# Patient Record
Sex: Male | Born: 1940 | Race: White | Hispanic: No | Marital: Married | Smoking: Former smoker
Health system: Southern US, Community
[De-identification: ages and names within clinical notes are randomized; demographics above are authoritative.]

## PROBLEM LIST (undated history)

## (undated) DIAGNOSIS — Z87442 Personal history of urinary calculi: Secondary | ICD-10-CM

## (undated) DIAGNOSIS — C61 Malignant neoplasm of prostate: Secondary | ICD-10-CM

## (undated) DIAGNOSIS — Z8489 Family history of other specified conditions: Secondary | ICD-10-CM

## (undated) DIAGNOSIS — I499 Cardiac arrhythmia, unspecified: Secondary | ICD-10-CM

## (undated) DIAGNOSIS — I10 Essential (primary) hypertension: Secondary | ICD-10-CM

## (undated) DIAGNOSIS — R011 Cardiac murmur, unspecified: Secondary | ICD-10-CM

## (undated) DIAGNOSIS — M1990 Unspecified osteoarthritis, unspecified site: Secondary | ICD-10-CM

## (undated) DIAGNOSIS — K429 Umbilical hernia without obstruction or gangrene: Secondary | ICD-10-CM

## (undated) DIAGNOSIS — G4730 Sleep apnea, unspecified: Secondary | ICD-10-CM

## (undated) DIAGNOSIS — I480 Paroxysmal atrial fibrillation: Secondary | ICD-10-CM

## (undated) DIAGNOSIS — K529 Noninfective gastroenteritis and colitis, unspecified: Secondary | ICD-10-CM

## (undated) HISTORY — DX: Malignant neoplasm of prostate: C61

## (undated) HISTORY — DX: Cardiac murmur, unspecified: R01.1

## (undated) HISTORY — PX: NASAL SINUS SURGERY: SHX719

## (undated) HISTORY — DX: Unspecified osteoarthritis, unspecified site: M19.90

## (undated) HISTORY — DX: Noninfective gastroenteritis and colitis, unspecified: K52.9

## (undated) HISTORY — DX: Cardiac arrhythmia, unspecified: I49.9

## (undated) HISTORY — PX: PROSTATECTOMY: SHX69

## (undated) HISTORY — PX: LEG SURGERY: SHX1003

## (undated) HISTORY — PX: BACK SURGERY: SHX140

## (undated) HISTORY — DX: Sleep apnea, unspecified: G47.30

## (undated) HISTORY — DX: Essential (primary) hypertension: I10

---

## 2000-12-20 ENCOUNTER — Ambulatory Visit (HOSPITAL_COMMUNITY): Admission: RE | Admit: 2000-12-20 | Discharge: 2000-12-20 | Payer: Self-pay | Admitting: Gastroenterology

## 2002-03-11 ENCOUNTER — Encounter: Payer: Self-pay | Admitting: Orthopedic Surgery

## 2002-03-11 ENCOUNTER — Encounter: Payer: Self-pay | Admitting: Emergency Medicine

## 2002-03-11 ENCOUNTER — Inpatient Hospital Stay (HOSPITAL_COMMUNITY): Admission: EM | Admit: 2002-03-11 | Discharge: 2002-03-12 | Payer: Self-pay | Admitting: Emergency Medicine

## 2002-08-16 ENCOUNTER — Ambulatory Visit (HOSPITAL_COMMUNITY): Admission: RE | Admit: 2002-08-16 | Discharge: 2002-08-16 | Payer: Self-pay | Admitting: Orthopedic Surgery

## 2004-12-21 ENCOUNTER — Inpatient Hospital Stay (HOSPITAL_COMMUNITY): Admission: RE | Admit: 2004-12-21 | Discharge: 2004-12-23 | Payer: Self-pay | Admitting: Urology

## 2011-01-05 ENCOUNTER — Other Ambulatory Visit (HOSPITAL_COMMUNITY): Payer: Self-pay | Admitting: Internal Medicine

## 2011-01-05 DIAGNOSIS — I340 Nonrheumatic mitral (valve) insufficiency: Secondary | ICD-10-CM

## 2011-01-06 ENCOUNTER — Ambulatory Visit (HOSPITAL_COMMUNITY): Payer: Medicare Other | Attending: Internal Medicine

## 2011-01-06 ENCOUNTER — Encounter: Payer: Self-pay | Admitting: Cardiology

## 2011-01-06 ENCOUNTER — Ambulatory Visit (INDEPENDENT_AMBULATORY_CARE_PROVIDER_SITE_OTHER): Payer: Medicare Other | Admitting: Cardiology

## 2011-01-06 VITALS — BP 120/70 | HR 150 | Resp 18 | Ht 71.0 in | Wt 198.0 lb

## 2011-01-06 DIAGNOSIS — R011 Cardiac murmur, unspecified: Secondary | ICD-10-CM | POA: Insufficient documentation

## 2011-01-06 DIAGNOSIS — I059 Rheumatic mitral valve disease, unspecified: Secondary | ICD-10-CM | POA: Insufficient documentation

## 2011-01-06 DIAGNOSIS — I4891 Unspecified atrial fibrillation: Secondary | ICD-10-CM

## 2011-01-06 DIAGNOSIS — K5190 Ulcerative colitis, unspecified, without complications: Secondary | ICD-10-CM

## 2011-01-06 DIAGNOSIS — I340 Nonrheumatic mitral (valve) insufficiency: Secondary | ICD-10-CM

## 2011-01-06 DIAGNOSIS — I079 Rheumatic tricuspid valve disease, unspecified: Secondary | ICD-10-CM | POA: Insufficient documentation

## 2011-01-06 DIAGNOSIS — G4733 Obstructive sleep apnea (adult) (pediatric): Secondary | ICD-10-CM

## 2011-01-06 DIAGNOSIS — E785 Hyperlipidemia, unspecified: Secondary | ICD-10-CM

## 2011-01-06 DIAGNOSIS — I10 Essential (primary) hypertension: Secondary | ICD-10-CM

## 2011-01-06 MED ORDER — METOPROLOL TARTRATE 25 MG PO TABS: 25.0000 mg | ORAL_TABLET | Freq: Two times a day (BID) | ORAL | Status: DC

## 2011-01-06 MED ORDER — ASPIRIN EC 81 MG PO TBEC: 81.0000 mg | DELAYED_RELEASE_TABLET | Freq: Every day | ORAL | Status: DC

## 2011-01-06 NOTE — Patient Instructions (Addendum)
Your physician has recommended you make the following change in your medication: START Aspirin 81mg  once a day, START Metoprolol Tartrate 25mg  take one by mouth two times a day (please do not drive over the weekend due to new medication)  Please follow-up with Dr Felipa Eth next week for office visit and chest x-ray.  They will contact you for appointment.   Your physician has requested that you regularly monitor and record your blood pressure readings at home. Please use the same machine at the same time of day to check your readings and record them to bring to your follow-up visit.  Your physician recommends that you schedule a follow-up appointment in: 2 WEEKS with Dr Riley Kill

## 2011-01-09 ENCOUNTER — Encounter: Payer: Self-pay | Admitting: Cardiology

## 2011-01-09 DIAGNOSIS — E785 Hyperlipidemia, unspecified: Secondary | ICD-10-CM | POA: Insufficient documentation

## 2011-01-09 DIAGNOSIS — G4733 Obstructive sleep apnea (adult) (pediatric): Secondary | ICD-10-CM

## 2011-01-09 DIAGNOSIS — I10 Essential (primary) hypertension: Secondary | ICD-10-CM | POA: Insufficient documentation

## 2011-01-09 DIAGNOSIS — K5190 Ulcerative colitis, unspecified, without complications: Secondary | ICD-10-CM

## 2011-01-09 DIAGNOSIS — I4891 Unspecified atrial fibrillation: Secondary | ICD-10-CM | POA: Insufficient documentation

## 2011-01-09 HISTORY — DX: Hyperlipidemia, unspecified: E78.5

## 2011-01-09 HISTORY — DX: Ulcerative colitis, unspecified, without complications: K51.90

## 2011-01-09 HISTORY — DX: Obstructive sleep apnea (adult) (pediatric): G47.33

## 2011-01-09 NOTE — Progress Notes (Signed)
HPI:  Mr. Fauth underwent an echocardiogram today, was noted to be in asymptomatic atrial fib with rapid response, and Dr. Tenny Craw called Dr. Felipa Eth, who requested the patient be evaluated.  He was unaware of his rhythm, and perhaps notices this happening.  He denies prior cva, or other symptoms.  Echo today suggested a pleural effusion, but he is not short of breath, and denies a cough.  His CAHDS score is 1, and CHADS vasc is 2.  Echo showed normal EF, mild MR, moderate TR, PA peak pressure of 37 mm Hg.  In questioning, he denies chest pain, weight loss, or other major symptoms.  It is of note that the patient has sleep apnea, and the machine does not work for him.  He has been evaluated by Dr. Annalee Genta, and there has been discussion about getting a deviated septum fixed, with the hope that he would be a CPAP candidate after this is over.  He has had HTN for about 15 years, and denies knowledge of DM.  It is of note also that he has a history of ulcerative colitis, and has had blood in stool in the past.  Pulse was 81 and regular when seen by Dr. Felipa Eth on 3/22.  Note that his TSH was normal 12/25/2010.  Current Outpatient Prescriptions  Medication Sig Dispense Refill  . cetirizine (ZYRTEC) 10 MG tablet Take 10 mg by mouth daily.        Marland Kitchen sulfaSALAzine (AZULFIDINE) 500 MG tablet 500 mg. Take 4 tablets twice a day       . telmisartan (MICARDIS) 80 MG tablet Take 80 mg by mouth daily.        Marland Kitchen aspirin EC 81 MG EC tablet Take 1 tablet (81 mg total) by mouth daily.  150 tablet  2  . metoprolol tartrate (LOPRESSOR) 25 MG tablet Take 1 tablet (25 mg total) by mouth 2 (two) times daily.  60 tablet  11    No Known Allergies  Past Medical History  Diagnosis Date  . Hypertension   . Colitis   . Sleep apnea   . Arrhythmia   . Arthritis     Past Surgical History  Procedure Date  . Back surgery   . Prostatectomy   . Nasal sinus surgery   . Leg surgery     right    Family History  Problem Relation Age  of Onset  . Parkinsonism Father 54    deceased  . Dementia Mother 66    deceased  . Hypertension Brother 73    alive  . Hypertension Sister 30    alive  . Hypothyroidism Sister     History   Social History  . Marital Status: Married    Spouse Name: N/A    Number of Children: 2  . Years of Education: N/A   Occupational History  . retired     Secondary school teacher   Social History Main Topics  . Smoking status: Former Smoker    Types: Cigarettes    Quit date: 10/10/1969  . Smokeless tobacco: Not on file  . Alcohol Use: No  . Drug Use: No  . Sexually Active: Not on file   Other Topics Concern  . Not on file   Social History Narrative  . No narrative on file    ROS: Please see the HPI.  All other systems reviewed and negative.  He has not noted any palpitations recently.   PHYSICAL EXAM:  BP 120/70  Pulse 150  Resp  18  Ht 5\' 11"  (1.803 m)  Wt 198 lb (89.812 kg)  BMI 27.62 kg/m2  General: Well developed, well nourished, in no acute distress. Head:  Normocephalic and atraumatic. Neck: no JVD Lungs: Clear to auscultation and percussion. Heart: Rapid, uncontrolled rhythm at present.  I cannot appreciate a murmur at this time.  PMI cannot be felt.  No gallop rhythm.  Abdomen:  Normal bowel sounds; soft; non tender; no organomegaly Pulses: Pulses normal in all 4 extremities. Extremities: No clubbing or cyanosis. No edema. Neurologic: Alert and oriented x 3.  EKG:  Atrial fibrillation with rapid response.  Rare PVC.  No acute changes.  Rate 150.    ASSESSMENT AND PLAN:

## 2011-01-11 ENCOUNTER — Telehealth: Payer: Self-pay | Admitting: Cardiology

## 2011-01-11 NOTE — Telephone Encounter (Signed)
[place] faxed to Dr.Alva/Heather @ [phone] 01/11/11/KM

## 2011-01-14 ENCOUNTER — Encounter: Payer: Self-pay | Admitting: Cardiology

## 2011-01-18 ENCOUNTER — Ambulatory Visit (INDEPENDENT_AMBULATORY_CARE_PROVIDER_SITE_OTHER): Payer: Medicare Other | Admitting: Cardiology

## 2011-01-18 ENCOUNTER — Encounter: Payer: Self-pay | Admitting: Cardiology

## 2011-01-18 ENCOUNTER — Encounter (HOSPITAL_COMMUNITY): Payer: Self-pay | Admitting: Internal Medicine

## 2011-01-18 DIAGNOSIS — G4733 Obstructive sleep apnea (adult) (pediatric): Secondary | ICD-10-CM

## 2011-01-18 DIAGNOSIS — I4891 Unspecified atrial fibrillation: Secondary | ICD-10-CM

## 2011-01-18 DIAGNOSIS — I10 Essential (primary) hypertension: Secondary | ICD-10-CM

## 2011-01-18 NOTE — Progress Notes (Signed)
HPI:  Patient is seen in follow up.  He has converted to normal rhythm.  We have reviewed in detail his findings with him, including both CHADS VASC 2 and CHADS 2 score.  He denies any symptoms.  He does have surgery planned for OSA.  He cannot wear a mask at this time because he cannot.    Current Outpatient Prescriptions  Medication Sig Dispense Refill  . aspirin EC 81 MG EC tablet Take 1 tablet (81 mg total) by mouth daily.  150 tablet  2  . cetirizine (ZYRTEC) 10 MG tablet Take 10 mg by mouth daily.        . metoprolol tartrate (LOPRESSOR) 25 MG tablet Take 1 tablet (25 mg total) by mouth 2 (two) times daily.  60 tablet  11  . sulfaSALAzine (AZULFIDINE) 500 MG tablet 500 mg. Take 4 tablets twice a day       . telmisartan (MICARDIS) 80 MG tablet Take 80 mg by mouth daily.          No Known Allergies  Past Medical History  Diagnosis Date  . Hypertension   . Colitis   . Sleep apnea   . Arrhythmia   . Arthritis     Past Surgical History  Procedure Date  . Back surgery   . Prostatectomy   . Nasal sinus surgery   . Leg surgery     right    Family History  Problem Relation Age of Onset  . Parkinsonism Father 21    deceased  . Dementia Mother 60    deceased  . Hypertension Brother 73    alive  . Hypertension Sister 66    alive  . Hypothyroidism Sister     History   Social History  . Marital Status: Married    Spouse Name: N/A    Number of Children: 2  . Years of Education: N/A   Occupational History  . retired     Secondary school teacher   Social History Main Topics  . Smoking status: Former Smoker    Types: Cigarettes    Quit date: 10/10/1969  . Smokeless tobacco: Not on file  . Alcohol Use: No  . Drug Use: No  . Sexually Active: Not on file   Other Topics Concern  . Not on file   Social History Narrative  . No narrative on file    ROS: Please see the HPI.  All other systems reviewed and negative.  PHYSICAL EXAM:  BP 125/76  Pulse 68  Resp 18  Ht 5\' 11"   (1.803 m)  Wt 202 lb 12.8 oz (91.989 kg)  BMI 28.28 kg/m2  General: Well developed, well nourished, in no acute distress. Head:  Normocephalic and atraumatic. Neck: no JVD Lungs: Clear to auscultation and percussion. Heart: Normal S1 and S2.  No murmur, rubs or gallops.  Pulses: Pulses normal in all 4 extremities. Extremities: No clubbing or cyanosis. No edema. Neurologic: Alert and oriented x 3.  EKG:  Normal sinus rhythm.  WNL. ASSESSMENT AND PLAN:

## 2011-01-18 NOTE — Assessment & Plan Note (Signed)
Perhaps better controlled.   Log reveiwed and will file in chart.

## 2011-01-18 NOTE — Assessment & Plan Note (Signed)
Patient is now in normal rhythm.  His annual stroke risk is low, and he is on ASA which he will hold soon for his nasal surgery.  It is designed to help his OSA which likely is a trigger for his AF.  Given this we will see him back in two months, and reconsider his situation.  At present, he will not be treated with anticoagulation.  Patient is a agreeable to this, and we have answered all his questions.

## 2011-01-18 NOTE — Patient Instructions (Signed)
Your physician recommends that you schedule a follow-up appointment in: 2 MONTHS  Your physician recommends that you continue on your current medications as directed. Please refer to the Current Medication list given to you today.   

## 2011-01-18 NOTE — Assessment & Plan Note (Signed)
See above.  He will undergo surgery, which could help his afib if this is a trigger.  Will defer until after surgery.  Resume ASA after surgery.

## 2011-01-26 ENCOUNTER — Encounter (HOSPITAL_COMMUNITY)
Admission: RE | Admit: 2011-01-26 | Discharge: 2011-01-26 | Disposition: A | Discharge status: Home / Self Care | Payer: Medicare Other | Source: Ambulatory Visit | Attending: Otolaryngology | Admitting: Otolaryngology

## 2011-01-26 LAB — BASIC METABOLIC PANEL
CO2: 26 mEq/L (ref 19–32)
Calcium: 9.1 mg/dL (ref 8.4–10.5)
Chloride: 106 mEq/L (ref 96–112)
GFR calc Af Amer: >60 mL/min (ref >60)
Glucose, Bld: 112 mg/dL — ABNORMAL HIGH (ref 70–99)
Potassium: 4.7 mEq/L (ref 3.5–5.1)
Sodium: 136 mEq/L (ref 135–145)

## 2011-01-26 LAB — CBC
Hemoglobin: 12.4 g/dL — ABNORMAL LOW (ref 13.0–17.0)
RBC: 4.63 MIL/uL (ref 4.22–5.81)
WBC: 4.1 10*3/uL (ref 4.0–10.5)

## 2011-01-26 LAB — SURGICAL PCR SCREEN: MRSA, PCR: NEGATIVE

## 2011-01-28 ENCOUNTER — Telehealth: Payer: Self-pay | Admitting: Cardiology

## 2011-01-28 NOTE — Telephone Encounter (Signed)
Faxed OV, EKG, CXR & Echo to [facility] (per [place]) @ [facility] ([phone]).

## 2011-02-02 ENCOUNTER — Observation Stay (HOSPITAL_COMMUNITY)
Admission: RE | Admit: 2011-02-02 | Discharge: 2011-02-03 | Disposition: A | Discharge status: Home / Self Care | Payer: Medicare Other | Source: Ambulatory Visit | Attending: Otolaryngology | Admitting: Otolaryngology

## 2011-02-02 DIAGNOSIS — J339 Nasal polyp, unspecified: Secondary | ICD-10-CM | POA: Insufficient documentation

## 2011-02-02 DIAGNOSIS — J343 Hypertrophy of nasal turbinates: Secondary | ICD-10-CM | POA: Insufficient documentation

## 2011-02-02 DIAGNOSIS — J342 Deviated nasal septum: Principal | ICD-10-CM | POA: Insufficient documentation

## 2011-02-02 DIAGNOSIS — I10 Essential (primary) hypertension: Secondary | ICD-10-CM | POA: Insufficient documentation

## 2011-02-02 DIAGNOSIS — Z01812 Encounter for preprocedural laboratory examination: Secondary | ICD-10-CM | POA: Insufficient documentation

## 2011-02-02 DIAGNOSIS — G4733 Obstructive sleep apnea (adult) (pediatric): Secondary | ICD-10-CM | POA: Insufficient documentation

## 2011-02-07 NOTE — Op Note (Signed)
NAME:  Daniel Dalton, Daniel Dalton NO.:  [id number]  MEDICAL RECORD NO.:  [id number]           PATIENT TYPE:  O  LOCATION:  2608                         FACILITY:  [facility]  PHYSICIAN:  Early Chars. Wilburn Cornelia, M.D.DATE OF BIRTH:  [birth year]  DATE OF PROCEDURE:  02/02/2011 DATE OF DISCHARGE:                              OPERATIVE REPORT   PREOPERATIVE DIAGNOSES: 1. Deviated nasal septum with nasal airway obstruction. 2. History of previous nasal septoplasty. 3. Bilateral inferior turbinate reduction. 4. Obstructive sleep apnea.  POSTOPERATIVE DIAGNOSES: 1. Deviated nasal septum with nasal airway obstruction. 2. History of previous nasal septoplasty. 3. Bilateral inferior turbinate reduction. 4. Obstructive sleep apnea. 5. Bilateral posterior nasal polyps.  INDICATIONS FOR SURGERY: 1. Deviated nasal septum with nasal airway obstruction. 2. History of previous nasal septoplasty. 3. Bilateral inferior turbinate reduction. 4. Obstructive sleep apnea.  SURGICAL PROCEDURES: 1. Revision nasal septoplasty. 2. Bilateral inferior turbinate reduction. 3. Cautery and removal of bilateral posterior nasal polyps.  ANESTHESIA:  General endotracheal.  SURGEON:  Early Chars. Wilburn Cornelia, MD  COMPLICATIONS:  None.  ESTIMATED BLOOD LOSS:  Less than 50 mL.  The patient was transferred from the operating room to the recovery room in stable condition.  BRIEF HISTORY:  The patient is a 70 year old white male with a history of progressive nasal airway obstruction who was diagnosed with obstructive sleep apnea but had significant difficulty wearing his CPAP because of prolonged symptoms of nasal airway obstruction.  He has undergone previous nasal septoplasty with limited improvement. Examination in the office revealed significant nasal septal scarring and continued septal deviation with obstruction and bilateral inferior turbinate hypertrophy.  The risks, benefits, and possible  complications of surgical procedure were discussed in detail.  We anticipated overnight observation in the hospital because of his history of obstructive sleep apnea with observation in the step-down unit.  The patient understood and concurred with our plan for surgery which is scheduled under general anesthesia as an outpatient on February 02, 2011.  PROCEDURE:  The patient was brought to the operating room on February 02, 2011, placed in supine position on the operating table.  General endotracheal anesthesia was established without difficulty and the patient was adequately anesthetized.  He was positioned on the operating table and then injected with a total of 8 mL of 1% lidocaine 1:100,000 solution which was injected in the submucosa.  The patient's nose was injected with 8 mL of 1% lidocaine 1:100,000 dilution of epinephrine and his nose was then packed with Afrin-soaked cottonoid pledgets and let in place for approximately 10 minutes to allow for vasoconstriction, hemostasis.  The patient was then positioned, prepped, and draped in a sterile fashion and the surgical procedure was begun.  The patient's nasal cavity was examined bilaterally.  He had a significant residual left septal deviation with septal spurring and significant scarring in the mid aspect of the nasal septum after his previous surgery.  An anterior hemitransfixion incision was created on the left-hand side and mucoperichondrial flap was elevated from anterior to posterior on the left aspect of the nasal septum.  Previously dissected cartilage was carefully mobilized.  Posterior bony  septal spur was dissected preserving the overlying soft tissue and mucosa.  Using through-cutting forceps, the bony septal deviation was resected and the septal flaps were reapproximated bringing the septum to a good midline position.  The septal flaps were then closed with a 4-0 gut suture on a Keith needle in horizontal mattress fashion  and the anterior hemitransfixion incision was closed with the same stitch.  At the conclusion of the surgical procedure, bilateral Doyle nasal septal splints were placed after the application of Bactroban ointment and were sutured in position with a 3-0 Ethilon suture.  Attention was then turned to the inferior turbinates.  With bipolar intramural cautery set at 12 watts, two submucosal passes were made in the anterior aspect of each inferior turbinate and when the turbinates had been adequately cauterized, anterior incisions were created overlying soft tissue and mucosa elevated and a small amount of turbinate bone was resected.  Residual turbinate tissue was then outfractured to create a more patent nasal cavity.  With the septum midline and the turbinates lateralized, I was then able to visualize the posterior nasopharynx and nasal cavity.  The patient had significant polypoid disease within the posterior aspect of the nasal cavity.  Using monopolar suction cautery, the polyps were cauterized to shrink and then removed with a through-cutting forceps. This procedure was carried out bilaterally.  There was no significant bleeding.  This rendered the nasal passageway and nasopharynx widely patent at the conclusion of the procedure.  Nasal cavity was then irrigated and suctioned.  Orogastric tube was passed.  Stomach contents were aspirated.  The patient awakened from his anesthetic, extubated, and then transferred from the operating room to the recovery room in stable condition.  No complications and blood loss was less than 50 mL.          ______________________________ Early Chars. Wilburn Cornelia, M.D.     DLS/MEDQ  D:  25/85/2778  T:  02/03/2011  Job:  [id number]  Electronically Signed by Jerrell Belfast M.D. on 02/07/2011 10:24:12 AM

## 2011-03-31 ENCOUNTER — Ambulatory Visit (INDEPENDENT_AMBULATORY_CARE_PROVIDER_SITE_OTHER): Payer: Medicare Other | Admitting: Cardiology

## 2011-03-31 ENCOUNTER — Ambulatory Visit: Payer: Medicare Other | Admitting: Cardiology

## 2011-03-31 ENCOUNTER — Encounter: Payer: Self-pay | Admitting: Cardiology

## 2011-03-31 VITALS — BP 144/86 | HR 55 | Resp 18 | Ht 71.0 in | Wt 200.0 lb

## 2011-03-31 DIAGNOSIS — I10 Essential (primary) hypertension: Secondary | ICD-10-CM

## 2011-03-31 DIAGNOSIS — I4891 Unspecified atrial fibrillation: Secondary | ICD-10-CM

## 2011-03-31 NOTE — Patient Instructions (Signed)
Your physician wants you to follow-up in: 6 months with Dr. Stuckey. You will receive a reminder letter in the mail two months in advance. If you don't receive a letter, please call our office to schedule the follow-up appointment.  

## 2011-03-31 NOTE — Progress Notes (Signed)
HPI:  He is in for follow up.  He had nasal surgery but does not know if he is improved. Denies any chest pain.  He is getting along well otherwise.  He prefers to avoid coumadin.  Chads system again reviewed with patient in detail.  He did go out of rhythm at time of surgery, but is now better.  No arrythmia.  Current Outpatient Prescriptions  Medication Sig Dispense Refill  . aspirin EC 81 MG EC tablet Take 1 tablet (81 mg total) by mouth daily.  150 tablet  2  . metoprolol tartrate (LOPRESSOR) 25 MG tablet Take 1 tablet (25 mg total) by mouth 2 (two) times daily.  60 tablet  11  . sulfaSALAzine (AZULFIDINE) 500 MG tablet 500 mg. Take 4 tablets twice a day       . telmisartan (MICARDIS) 80 MG tablet Take 80 mg by mouth daily.        Marland Kitchen DISCONTD: cetirizine (ZYRTEC) 10 MG tablet Take 10 mg by mouth daily.          No Known Allergies  Past Medical History  Diagnosis Date  . Hypertension   . Colitis   . Sleep apnea   . Arrhythmia   . Arthritis     Past Surgical History  Procedure Date  . Back surgery   . Prostatectomy   . Nasal sinus surgery   . Leg surgery     right    Family History  Problem Relation Age of Onset  . Parkinsonism Father 42    deceased  . Dementia Mother 66    deceased  . Hypertension Brother 73    alive  . Hypertension Sister 2    alive  . Hypothyroidism Sister     History   Social History  . Marital Status: Married    Spouse Name: N/A    Number of Children: 2  . Years of Education: N/A   Occupational History  . retired     Secondary school teacher   Social History Main Topics  . Smoking status: Former Smoker    Types: Cigarettes    Quit date: 10/10/1969  . Smokeless tobacco: Not on file  . Alcohol Use: No  . Drug Use: No  . Sexually Active: Not on file   Other Topics Concern  . Not on file   Social History Narrative  . No narrative on file    ROS: Please see the HPI.  All other systems reviewed and negative.  PHYSICAL EXAM:  BP 144/86   Pulse 55  Resp 18  Ht 5\' 11"  (1.803 m)  Wt 200 lb (90.719 kg)  BMI 27.89 kg/m2  General: Well developed, well nourished, in no acute distress. Head:  Normocephalic and atraumatic. Neck: no JVD Lungs: Clear to auscultation and percussion. Heart: Normal S1 and S2.  No murmur, rubs or gallops.  Neurologic: Alert and oriented x 3.  EKG:  Not repeated.  Normal rhythm by exam.   ASSESSMENT AND PLAN:

## 2011-03-31 NOTE — Assessment & Plan Note (Signed)
Under control.  Borderline today.

## 2011-04-11 NOTE — Assessment & Plan Note (Signed)
He seems to be in sinus rhythm, and seems to be stable.  He is set to be seen in follow up by Dr. Annalee Genta for evaluation of the success of his surgery.  I pointed out the importance in part secondary to the role that OSA plays in atrial fibrillation generation.  He understands. He wants to be treated the way he is.  CHADS score is noted.

## 2011-05-17 ENCOUNTER — Ambulatory Visit (HOSPITAL_BASED_OUTPATIENT_CLINIC_OR_DEPARTMENT_OTHER): Payer: Medicare Other | Attending: Otolaryngology

## 2011-05-17 DIAGNOSIS — G4737 Central sleep apnea in conditions classified elsewhere: Secondary | ICD-10-CM | POA: Insufficient documentation

## 2011-05-17 DIAGNOSIS — G4733 Obstructive sleep apnea (adult) (pediatric): Secondary | ICD-10-CM | POA: Insufficient documentation

## 2011-05-21 DIAGNOSIS — G4737 Central sleep apnea in conditions classified elsewhere: Secondary | ICD-10-CM

## 2011-05-21 DIAGNOSIS — G4733 Obstructive sleep apnea (adult) (pediatric): Secondary | ICD-10-CM

## 2011-05-21 NOTE — Procedures (Signed)
NAME:  Daniel Dalton, Daniel Dalton NO.:  [id number]  MEDICAL RECORD NO.:  [id number]          PATIENT TYPE:  OUT  LOCATION:  SLEEP CENTER                 [facility]ACILITY:  [facility]  PHYSICIAN:  Clinton D. Annamaria Boots, MD, FCCP, FACPDATE OF BIRTH:  [birth year] 21, 1941  DATE OF STUDY:                           NOCTURNAL POLYSOMNOGRAM  REFERRING PHYSICIAN:  Shanon Brow L. Wilburn Cornelia, M.D.  INDICATION FOR STUDY:  Hypersomnia with sleep apnea.  EPWORTH SLEEPINESS SCORE:  13/24, BMI 28.  Weight 200 pounds, height 71 inches.  Neck 7 inches.  Home medications charted and reviewed.  SLEEP ARCHITECTURE:  Split study protocol.  During the diagnostic phase, total sleep time 130.5 minutes with sleep efficiency 67.8%.  Stage I was 32.2%, stage II 59.8%, stage III absent, REM 8% of total sleep time. Sleep latency 16.5 minutes, REM latency 80 minutes, awake after sleep onset 46 minutes, arousal index 31.7.  Bedtime medication:  None.  RESPIRATORY DATA:  Split study protocol.  Apnea/hypopnea index (AHI) 27.1 per hour.  A total of 59 events were scored including 1 obstructive apneas, 37 central apneas, 2 mixed apneas, 19 hypopneas.  Events were not positional.  REM AHI 40 per hour.  CPAP was titrated to 11 cwp. Obstructive apneas were prevented, but central apneas persisted with an AHI of 55.1 per hour.  BiPAP was tried with an inspiratory pressure of 13 and expiratory pressure of 9 cwp.  Again persistent central apneas were noted.  AHI 57.4.  He wore a medium ResMed Quattro FX full-face mask with heated humidifier.  Respiratory pattern appeared consistent with Cheyne-Stokes breathing pattern.  OXYGEN DATA:  Moderately loud snoring before CPAP with oxygen desaturation to a nadir of 85%.  With CPAP titration, mean oxygen saturation held 95.1% on room air and snoring was prevented.  CARDIAC DATA:  Normal sinus rhythm.  MOVEMENT/PARASOMNIA:  No significant movement disturbance.  No  bathroom trips.  IMPRESSION/RECOMMENDATIONS: 1. Sleep architecture was markedly fragmented by frequent brief     waking, partly due to respiratory events.  Addressing an insomnia     component may be helpful. 2. Moderate central and obstructive sleep apnea/hypopnea syndrome, AHI     27.1 per hour with nonpositional events, moderately loud snoring     and oxygen desaturation to a nadir of 85% on room air. 3. The obstructive apnea component was controlled by CPAP at 11 cwp     wearing a medium ResMed Quattro FX full-face mask.  Residual     central apneas did not appear to be affected by a trial of BiPAP at     inspiratory pressure 13, expiratory pressure 9.  However, he was     only asleep for a few minutes while wearing BiPAP.     Central apnea was associated with Cheyne-Stokes respiratory pattern     indicative of feedback loop delay in central respiratory centers     likely related to cerebrovascular blood flow.     Clinton D. Annamaria Boots, MD, [facility], [place], [employer] adviser of Sleep Medicine Electronically Signed    CDY/MEDQ  D:  05/21/2011 09:01:44  T:  05/21/2011 09:29:34  Job:  [id number]

## 2011-08-09 ENCOUNTER — Encounter: Payer: Self-pay | Admitting: Internal Medicine

## 2011-08-09 ENCOUNTER — Ambulatory Visit (INDEPENDENT_AMBULATORY_CARE_PROVIDER_SITE_OTHER): Payer: Medicare Other | Admitting: Internal Medicine

## 2011-08-09 VITALS — BP 130/76 | HR 65 | Ht 70.0 in | Wt 203.6 lb

## 2011-08-09 DIAGNOSIS — G4733 Obstructive sleep apnea (adult) (pediatric): Secondary | ICD-10-CM

## 2011-08-09 MED ORDER — TEMAZEPAM 15 MG PO CAPS: ORAL_CAPSULE | ORAL | Status: DC

## 2011-08-09 NOTE — Patient Instructions (Signed)
Order- new CPAP autotitrate 5-15 cwp x 2 weeks , mask of choice,  Humidifier  Dx OSA  Script temazepam 15mg , 1 or 2 for sleep as needed

## 2011-08-09 NOTE — Assessment & Plan Note (Signed)
The obstructive component should respond well to CPAP. We are left still with the problem he had 2 years ago, but discussed mask fit and goals for another trial. We also carefully discussed alternative therapies such as oral appliances and nasal valves.

## 2011-08-09 NOTE — Progress Notes (Signed)
08/09/11- 70 year old male former smoker referred courtesy of Dr. Annalee Genta because of sleep apnea. Dr. Felipa Eth is PCP. [name] has a history of loud snoring and difficulty breathing through his nose. In the spring of this year he had surgical reduction of turbinates which improved nasal airway flow but didn't change his sleep a great deal. NPSG at the [facility] on 05/17/2011 demonstrated moderate obstructive and central sleep apnea, AHI 27.1 per hour. Sleep was quite fragmented. CPAP was titrated to 11 which clear the obstructive events, bleeding frequent central events. A sleep study 2 years ago at [place] [facility] documented obstructive apnea. He could not get a mask to fit and gave up, not having used CPAP since then. Bedtime is 9:30 to 10 PM, sleep latency 5 minutes, awake 5 or 6 times during the night before up at 5 or 5:30 AM. Weight is stable. Medical history of hypertension, atrial fibrillation, chronic allergic rhinitis which has been unusually good this fall. He has had prostate cancer surgery. History of nasal polyps in 1984 without aspirin allergy. 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  ROS-see HPI Constitutional:   No-   weight loss, night sweats, fevers, chills, fatigue, lassitude.  HEENT:   No-  headaches, difficulty swallowing, tooth/dental problems, sore throat,       No-  sneezing, itching, ear ache, nasal congestion, post nasal drip,  CV:  No-   chest pain, orthopnea, PND, swelling in lower extremities, anasarca, dizziness, palpitations Resp: No-   shortness of breath with exertion or at rest.              No-   productive cough,  No non-productive cough,  No- coughing up of blood.              No-   change in color of mucus.  No- wheezing.   Skin: No-   rash or lesions. GI:  No-   heartburn, indigestion, abdominal pain, nausea, vomiting, diarrhea,                 change in bowel habits, loss of appetite GU: No-    dysuria, change in color of urine, no urgency or frequency.  No- flank pain. MS:  No-   joint pain or swelling.  No- decreased range of motion.  No- back pain. Neuro-     nothing unusual Psych:  No- change in mood or affect. No depression or anxiety.  No memory loss.  OBJ General- Alert, Oriented, Affect-appropriate, Distress- none acute; medium build. Skin- rash-none, lesions- none, excoriation- none Lymphadenopathy- none Head- atraumatic            Eyes- Gross vision intact, PERRLA, conjunctivae clear secretions            Ears- Hearing, canals-normal            Nose- narrow with considerable mucus, no-Septal dev, , polyps, erosion, perforation             Throat- Mallampati II , mucosa clear , drainage- none, tonsils- atrophic Neck- flexible , trachea midline, no stridor , thyroid nl, carotid no bruit Chest - symmetrical excursion , unlabored           Heart/CV- RRR , no murmur , no gallop  , no rub, nl s1 s2                           - JVD-  none , edema- none, stasis changes- none, varices- none           Lung- clear to P&A, wheeze- none, cough- none , dullness-none, rub- none           Chest wall-  Abd- tender-no, distended-no, bowel sounds-present, HSM- no Br/ Gen/ Rectal- Not done, not indicated Extrem- cyanosis- none, clubbing, none, atrophy- none, strength- nl Neuro- grossly intact to observation

## 2011-08-15 ENCOUNTER — Encounter: Payer: Self-pay | Admitting: Internal Medicine

## 2011-09-05 ENCOUNTER — Telehealth: Payer: Self-pay | Admitting: Internal Medicine

## 2011-09-05 NOTE — Telephone Encounter (Signed)
Order was placed on 08-09-11, so I called [facility] and spoke with rep and she states the order was placed today and that the pt will be contacted tomorrow to get time set -up. I ATCx1 the pt and no answer, no voicemail. WCB. Carron Curie, CMA

## 2011-09-06 NOTE — Telephone Encounter (Signed)
Pt says he finally spoke with someone at [facility] yesterday and is going to call them back today to set up a time to go for mask fitting. Pt was very upset with [facility] and the fact that this is taking so long. He did agree to go for the mask fitting with [facility] and will let us know if he continues to have trouble working with their company.

## 2011-09-06 NOTE — Telephone Encounter (Signed)
Pt returned call. Daniel Dalton  

## 2011-09-20 ENCOUNTER — Ambulatory Visit (INDEPENDENT_AMBULATORY_CARE_PROVIDER_SITE_OTHER): Payer: Medicare Other | Admitting: Internal Medicine

## 2011-09-20 ENCOUNTER — Encounter: Payer: Self-pay | Admitting: Internal Medicine

## 2011-09-20 VITALS — BP 110/64 | HR 66 | Ht 70.0 in | Wt 202.6 lb

## 2011-09-20 DIAGNOSIS — G4733 Obstructive sleep apnea (adult) (pediatric): Secondary | ICD-10-CM

## 2011-09-20 MED ORDER — CLONAZEPAM 0.5 MG PO TABS: ORAL_TABLET | ORAL | Status: DC

## 2011-09-20 NOTE — Patient Instructions (Signed)
Order- [facility]- Change DME for CPAP new set up as ordered initially 08/09/11. Does not want [facility]

## 2011-09-20 NOTE — Progress Notes (Signed)
08/09/11- 70 year old male former smoker referred courtesy of Dr. Wilburn Cornelia because of sleep apnea. Dr. Dagmar Hait is PCP. [name] has a history of loud snoring and difficulty breathing through his nose. In the spring of this year he had surgical reduction of turbinates which improved nasal airway flow but didn't change his sleep a great deal. NPSG at the [facility] on 05/17/2011 demonstrated moderate obstructive and central sleep apnea, AHI 27.1 per hour. Sleep was quite fragmented. CPAP was titrated to 11 which clear the obstructive events, bleeding frequent central events. A sleep study 2 years ago at [place] [facility] documented obstructive apnea. He could not get a mask to fit and gave up, not having used CPAP since then. Bedtime is 9:30 to 10 PM, sleep latency 5 minutes, awake 5 or 6 times during the night before up at 5 or 5:30 AM. Weight is stable. Medical history of hypertension, atrial fibrillation, chronic allergic rhinitis which has been unusually good this fall. He has had prostate cancer surgery. History of nasal polyps in 1984 without aspirin allergy. 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  09/20/11-  70 year old male former smoker referred courtesy of Dr. Wilburn Cornelia because of sleep apnea. Dr. Dagmar Hait is PCP. We had set up CPAP autotitration through [facility]. We got poor communication from them but apparently they still need a copy of his sleep study. He is not satisfied with their branch in [place] where he lives. We discussed changing home care companies. He did not feel that temazepam was adequate help for his sleep consolidation.  ROS-see HPI Constitutional:   No-   weight loss, night sweats, fevers, chills, fatigue, lassitude.  HEENT:   No-  headaches, difficulty swallowing, tooth/dental problems, sore throat,       No-  sneezing, itching, ear ache, nasal congestion, post nasal drip,  CV:  No-   chest pain, orthopnea, PND,  swelling in lower extremities, anasarca, dizziness, palpitations Resp: No-   shortness of breath with exertion or at rest.              No-   productive cough,  No non-productive cough,  No- coughing up of blood.              No-   change in color of mucus.  No- wheezing.   Skin: No-   rash or lesions. GI:  No-   heartburn, indigestion, abdominal pain, nausea, vomiting, diarrhea,                 change in bowel habits, loss of appetite GU: No-   dysuria, change in color of urine, no urgency or frequency.  No- flank pain. MS:  No-   joint pain or swelling.  No- decreased range of motion.  No- back pain. Neuro-     nothing unusual Psych:  No- change in mood or affect. No depression or anxiety.  No memory loss.  OBJ General- Alert, Oriented, Affect-appropriate, Distress- none acute; medium build. Exam as for last visit.- over 50% of time was spent in face to face discussion at this visit.  Skin- rash-none, lesions- none, excoriation- none Lymphadenopathy- none Head- atraumatic            Eyes- Gross vision intact, PERRLA, conjunctivae clear secretions            Ears- Hearing, canals-normal            Nose- narrow with considerable mucus, no-Septal dev, , polyps, erosion,  perforation             Throat- Mallampati II , mucosa clear , drainage- none, tonsils- atrophic Neck- flexible , trachea midline, no stridor , thyroid nl, carotid no bruit Chest - symmetrical excursion , unlabored           Heart/CV- RRR , no murmur , no gallop  , no rub, nl s1 s2                           - JVD- none , edema- none, stasis changes- none, varices- none           Lung- clear to P&A, wheeze- none, cough- none , dullness-none, rub- none           Chest wall-  Abd-  Br/ Gen/ Rectal- Not done, not indicated Extrem- cyanosis- none, clubbing, none, atrophy- none, strength- nl Neuro- grossly intact to observation

## 2011-09-23 ENCOUNTER — Telehealth: Payer: Self-pay | Admitting: Internal Medicine

## 2011-09-23 NOTE — Telephone Encounter (Signed)
Per CY-okay to put EPR on auto CPAP give range of 5-20 cwp-spoke with Tammy at [facility]; gave verbal.

## 2011-09-23 NOTE — Telephone Encounter (Signed)
[facility] is calling to see if it is ok to put EPR on auto cpap?  Pt is having a hard time exhaling.  CY please advise. thanks

## 2011-09-24 NOTE — Assessment & Plan Note (Signed)
Emphasis needs to be on establishing effective communication between him and a home care company. We will have him work with our coordinator to change from [facility]. We will try clonazepam instead of temazepam for sleep.

## 2011-09-28 ENCOUNTER — Ambulatory Visit (INDEPENDENT_AMBULATORY_CARE_PROVIDER_SITE_OTHER): Payer: Medicare Other | Admitting: Cardiology

## 2011-09-28 ENCOUNTER — Encounter: Payer: Self-pay | Admitting: Cardiology

## 2011-09-28 VITALS — BP 132/80 | HR 55 | Ht 71.0 in | Wt 203.0 lb

## 2011-09-28 DIAGNOSIS — I4891 Unspecified atrial fibrillation: Secondary | ICD-10-CM

## 2011-09-28 DIAGNOSIS — G4733 Obstructive sleep apnea (adult) (pediatric): Secondary | ICD-10-CM

## 2011-09-28 DIAGNOSIS — I2510 Atherosclerotic heart disease of native coronary artery without angina pectoris: Secondary | ICD-10-CM

## 2011-09-28 NOTE — Assessment & Plan Note (Signed)
Improved at present. Better since he changed companies.

## 2011-09-28 NOTE — Progress Notes (Signed)
   HPI:  Doing well.  A new CPAP company has come in and this is much better.  No arrythmia of which he is aware.  No chest pain.   Current Outpatient Prescriptions  Medication Sig Dispense Refill  . aspirin EC 81 MG EC tablet Take 1 tablet (81 mg total) by mouth daily.  150 tablet  2  . metoprolol tartrate (LOPRESSOR) 25 MG tablet Take 1 tablet (25 mg total) by mouth 2 (two) times daily.  60 tablet  11  . sulfaSALAzine (AZULFIDINE) 500 MG tablet 500 mg. Take 4 tablets twice a day       . telmisartan (MICARDIS) 80 MG tablet Take 80 mg by mouth daily.          No Known Allergies  Past Medical History  Diagnosis Date  . Hypertension   . Colitis   . Sleep apnea   . Arrhythmia   . Arthritis   . Prostate cancer     in remission    Past Surgical History  Procedure Date  . Back surgery   . Prostatectomy   . Nasal sinus surgery   . Leg surgery     right    Family History  Problem Relation Age of Onset  . Parkinsonism Father 76    deceased  . Dementia Mother 19    deceased  . Hypertension Brother 73    alive  . Hypertension Sister 63    alive  . Hypothyroidism Sister     History   Social History  . Marital Status: Married    Spouse Name: Cicero Noy    Number of Children: 2  . Years of Education: N/A   Occupational History  . retired     Education officer, environmental  .     Social History Main Topics  . Smoking status: Former Smoker -- 0.5 packs/day for 15 years    Types: Cigarettes    Quit date: 10/10/1969  . Smokeless tobacco: Not on file  . Alcohol Use: No  . Drug Use: No  . Sexually Active: Not on file   Other Topics Concern  . Not on file   Social History Narrative  . No narrative on file    ROS: Please see the HPI.  All other systems reviewed and negative.  PHYSICAL EXAM:  BP 132/80  Pulse 55  Ht 5\' 11"  (1.803 m)  Wt 92.08 kg (203 lb)  BMI 28.31 kg/m2  General: Well developed, well nourished, in no acute distress. Head:   Normocephalic and atraumatic. Neck: no JVD Lungs: Clear to auscultation and percussion. Heart: Normal S1 and S2.  No murmur, rubs or gallops.  Abdomen:  Normal bowel sounds; soft; non tender; no organomegaly Pulses: Pulses normal in all 4 extremities. Extremities: No clubbing or cyanosis. No edema. Neurologic: Alert and oriented x 3.  EKG:  SB.  Otherwise normal.    ASSESSMENT AND PLAN:

## 2011-09-28 NOTE — Assessment & Plan Note (Signed)
Maintaining NSR.  Have discussed CHADS in past.

## 2011-09-28 NOTE — Patient Instructions (Signed)
Your physician wants you to follow-up in: 6 MONTHS.  You will receive a reminder letter in the mail two months in advance. If you don't receive a letter, please call our office to schedule the follow-up appointment.  Your physician recommends that you continue on your current medications as directed. Please refer to the Current Medication list given to you today.  

## 2011-11-01 ENCOUNTER — Encounter: Payer: Self-pay | Admitting: Internal Medicine

## 2011-11-01 ENCOUNTER — Ambulatory Visit (INDEPENDENT_AMBULATORY_CARE_PROVIDER_SITE_OTHER): Payer: Medicare Other | Admitting: Internal Medicine

## 2011-11-01 VITALS — BP 116/64 | HR 64 | Ht 70.0 in | Wt 202.0 lb

## 2011-11-01 DIAGNOSIS — G4733 Obstructive sleep apnea (adult) (pediatric): Secondary | ICD-10-CM

## 2011-11-01 DIAGNOSIS — I10 Essential (primary) hypertension: Secondary | ICD-10-CM

## 2011-11-01 NOTE — Progress Notes (Addendum)
08/09/11- 71 year old male former smoker referred courtesy of Dr. Annalee Genta because of sleep apnea. Dr. Felipa Eth is PCP. [name] has a history of loud snoring and difficulty breathing through his nose. In the spring of this year he had surgical reduction of turbinates which improved nasal airway flow but didn't change his sleep a great deal. NPSG at the [facility] on 05/17/2011 demonstrated moderate obstructive and central sleep apnea, AHI 27.1 per hour. Sleep was quite fragmented. CPAP was titrated to 11 which clear the obstructive events, bleeding frequent central events. A sleep study 2 years ago at [place] [facility] documented obstructive apnea. He could not get a mask to fit and gave up, not having used CPAP since then. Bedtime is 9:30 to 10 PM, sleep latency 5 minutes, awake 5 or 6 times during the night before up at 5 or 5:30 AM. Weight is stable. Medical history of hypertension, atrial fibrillation, chronic allergic rhinitis which has been unusually good this fall. He has had prostate cancer surgery. History of nasal polyps in 1984 without aspirin allergy. 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  09/20/11-  71 year old male former smoker referred courtesy of Dr. Annalee Genta because of sleep apnea. Dr. Felipa Eth is PCP. We had set up CPAP autotitration through [facility]. We got poor communication from them but apparently they still need a copy of his sleep study. He is not satisfied with their branch in [place] where he lives. We discussed changing home care companies. He did not feel that temazepam was adequate help for his sleep consolidation.  11/01/11- 70 year old male former smoker referred courtesy of Dr. Annalee Genta because of sleep apnea. Dr. Felipa Eth is PCP. Wearing cpap 7-8 hours a night. Patient states mask fits ok.  His wife is here. He doesn't see much difference in energy level with CPAP and still wakes during the night. Clonazepam  worked but he was reluctant to stay on it, preferring to put up with the insomnia/sleep fragmentation. Admits with CPAP his blood pressure has been lower. It bothers him because he doesn't sleep comfortably on his back and the tubing pulls as he shifts from side to side. Download from [place] did show good compliance with his autotitration. He is using a fullface mask. We discussed options for CPAP.  ROS-see HPI Constitutional:   No-   weight loss, night sweats, fevers, chills, fatigue, lassitude.  HEENT:   No-  headaches, difficulty swallowing, tooth/dental problems, sore throat,       No-  sneezing, itching, ear ache, nasal congestion, post nasal drip,  CV:  No-   chest pain, orthopnea, PND, swelling in lower extremities, anasarca, dizziness, palpitations Resp: No-   shortness of breath with exertion or at rest.              No-   productive cough,  No non-productive cough,  No- coughing up of blood.              No-   change in color of mucus.  No- wheezing.   Skin: No-   rash or lesions. GI:  No-   heartburn, indigestion, abdominal pain, nausea, vomiting, diarrhea,                 change in bowel habits, loss of appetite GU:  MS:  No-   joint pain or swelling.  No- decreased range of motion.  No- back pain. Neuro-     nothing unusual Psych:  No- change in  mood or affect. No depression or anxiety.  No memory loss.  OBJ General- Alert, Oriented, Affect-appropriate, Distress- none acute,  Skin- rash-none, lesions- none, excoriation- none Lymphadenopathy- none Head- atraumatic            Eyes- Gross vision intact, PERRLA, conjunctivae clear secretions            Ears- Hearing, canals-normal            Nose- narrow with considerable mucus, no-Septal dev, , polyps, erosion, perforation             Throat- Mallampati II , mucosa clear , drainage- none, tonsils- atrophic Neck- flexible , trachea midline, no stridor , thyroid nl, carotid no bruit Chest - symmetrical excursion ,  unlabored           Heart/CV- RRR , no murmur , no gallop  , no rub, nl s1 s2                           - JVD- none , edema- none, stasis changes- none, varices- none           Lung- clear to P&A, wheeze- none, cough- none , dullness-none, rub- none           Chest wall-  Abd-  Br/ Gen/ Rectal- Not done, not indicated Extrem- cyanosis- none, clubbing, none, atrophy- none, strength- nl Neuro- grossly intact to observation

## 2011-11-01 NOTE — Patient Instructions (Signed)
Continue CPAP as AutoPAP through [facility]. Please call as needed

## 2011-11-02 ENCOUNTER — Telehealth: Payer: Self-pay | Admitting: Internal Medicine

## 2011-11-02 NOTE — Telephone Encounter (Signed)
[facility] Tammy but NA wcb

## 2011-11-03 NOTE — Telephone Encounter (Signed)
[facility] Tammy, NA, no voicemail. WCB. Leonore Bing, CMA

## 2011-11-04 ENCOUNTER — Encounter: Payer: Self-pay | Admitting: Internal Medicine

## 2011-11-04 NOTE — Assessment & Plan Note (Signed)
Some of his continued restlessness reflects his central apnea pattern which is a consequence of his cardiovascular disease. The obstructive component is controlled and he seems comfortable with autotitration. It is in his best interest to continue CPAP. I don't think we need to change to a fixed pressure.

## 2011-11-04 NOTE — Assessment & Plan Note (Signed)
His BP is being addressed medically, so I can prove any connection between this and his CPAP, but we did discuss the potential interaction.

## 2011-11-04 NOTE — Telephone Encounter (Signed)
ATC NA WCB 

## 2011-11-04 NOTE — Telephone Encounter (Signed)
Spoke with Tammy. She states that the pt was supposed to drop of form for [employer] regarding his CPAP compliance- there is a letter that will need to be done by CDY and the form states what is needed in the letter. CDY, please advise if you have this form and if not we will have Tammy re fax it. Thanks

## 2011-11-10 ENCOUNTER — Encounter: Payer: Self-pay | Admitting: Internal Medicine

## 2011-11-10 NOTE — Telephone Encounter (Signed)
LMOM for Daniel Dalton at [facility] to refax form.

## 2011-11-10 NOTE — Telephone Encounter (Signed)
We were under the impression that this had been done; please ask Tammy to refax. Thanks and sorry for the confusion.

## 2011-11-10 NOTE — Telephone Encounter (Signed)
Tammy from [place] called back stating to disregarding message. She now has every thing she needed. Will sign off on message.

## 2011-12-31 ENCOUNTER — Other Ambulatory Visit: Payer: Self-pay | Admitting: Cardiology

## 2012-01-30 ENCOUNTER — Other Ambulatory Visit (INDEPENDENT_AMBULATORY_CARE_PROVIDER_SITE_OTHER): Payer: Self-pay | Admitting: General Surgery

## 2012-01-30 ENCOUNTER — Other Ambulatory Visit (INDEPENDENT_AMBULATORY_CARE_PROVIDER_SITE_OTHER): Payer: Self-pay

## 2012-01-30 ENCOUNTER — Ambulatory Visit (INDEPENDENT_AMBULATORY_CARE_PROVIDER_SITE_OTHER): Payer: Medicare Other | Admitting: General Surgery

## 2012-01-30 ENCOUNTER — Encounter (INDEPENDENT_AMBULATORY_CARE_PROVIDER_SITE_OTHER): Payer: Self-pay | Admitting: General Surgery

## 2012-01-30 VITALS — BP 150/83 | HR 105 | Temp 98.0°F | Ht 70.0 in | Wt 202.2 lb

## 2012-01-30 DIAGNOSIS — L0211 Cutaneous abscess of neck: Secondary | ICD-10-CM

## 2012-01-30 DIAGNOSIS — L723 Sebaceous cyst: Secondary | ICD-10-CM

## 2012-01-30 HISTORY — DX: Sebaceous cyst: L72.3

## 2012-01-30 MED ORDER — CEPHALEXIN 250 MG PO CAPS: 250.0000 mg | ORAL_CAPSULE | Freq: Four times a day (QID) | ORAL | Status: AC

## 2012-01-30 NOTE — Progress Notes (Signed)
HPI The patient comes in with a large 3-4 cm infected sebaceous cyst which is now draining.  PE On examination this is a fluctuant cyst but has minimal surrounding erythema it is nontender he has not starting to drain.  Studiy review None  Assessment Infected but not draining sebaceous cyst.  Plan Under local anesthesia after Betadine prep was able to excise the sebaceous cyst with minimal contamination. It was irrigated with saline and closed with 7 simple sutures of 4-0 nylon. Cautery was obtained with hand-held heat cautery. I will see the patient back in the office in one week for suture removal. Because it was some pus in the cyst which did not contaminated we will place the patient on 3 days of oral Keflex therapy

## 2012-02-02 LAB — WOUND CULTURE
Gram Stain: NONE SEEN
Organism ID, Bacteria: NO GROWTH

## 2012-02-07 ENCOUNTER — Ambulatory Visit (INDEPENDENT_AMBULATORY_CARE_PROVIDER_SITE_OTHER): Payer: Medicare Other | Admitting: General Surgery

## 2012-02-07 ENCOUNTER — Encounter (INDEPENDENT_AMBULATORY_CARE_PROVIDER_SITE_OTHER): Payer: Self-pay | Admitting: General Surgery

## 2012-02-07 VITALS — BP 126/82 | Ht 70.0 in | Wt 204.0 lb

## 2012-02-07 DIAGNOSIS — Z09 Encounter for follow-up examination after completed treatment for conditions other than malignant neoplasm: Secondary | ICD-10-CM

## 2012-02-07 NOTE — Progress Notes (Signed)
HPI The patient comes in after excision of the right neck sedation cyst. Cultures from that excised sebaceous cyst were negative for growth  PE On examination today the wound is slightly erythematous at the borders but otherwise not exhibit any signs of acute infection. The wound is healed well enough to remove the sutures in place.  Studiy review None.  Assessment None well status post excision of a right neck sebaceous cyst.  Plan The plan is to remove the sutures and applied Steri-Strips with benzoin. Subsequently he will see me on a p.r.n. basis

## 2012-02-28 ENCOUNTER — Encounter: Payer: Medicare Other | Admitting: Internal Medicine

## 2012-04-05 ENCOUNTER — Encounter: Payer: Self-pay | Admitting: Internal Medicine

## 2012-04-05 ENCOUNTER — Ambulatory Visit (INDEPENDENT_AMBULATORY_CARE_PROVIDER_SITE_OTHER): Payer: Medicare Other | Admitting: Internal Medicine

## 2012-04-05 VITALS — BP 132/80 | HR 50 | Ht 70.0 in | Wt 209.4 lb

## 2012-04-05 DIAGNOSIS — G4733 Obstructive sleep apnea (adult) (pediatric): Secondary | ICD-10-CM

## 2012-04-05 MED ORDER — ARMODAFINIL 150 MG PO TABS: 150.0000 mg | ORAL_TABLET | Freq: Every day | ORAL | Status: DC

## 2012-04-05 NOTE — Progress Notes (Signed)
08/09/11- 71 year old male former smoker referred courtesy of Dr. Wilburn Cornelia because of sleep apnea. Dr. Dagmar Hait is PCP. Daniel Dalton has a history of loud snoring and difficulty breathing through his nose. In the spring of this year he had surgical reduction of turbinates which improved nasal airway flow but didn't change his sleep a great deal. NPSG at the [facility] on 05/17/2011 demonstrated moderate obstructive and central sleep apnea, AHI 27.1 per hour. Sleep was quite fragmented. CPAP was titrated to 11 which clear the obstructive events, bleeding frequent central events. A sleep study 2 years ago at [place] [facility] documented obstructive apnea. He could not get a mask to fit and gave up, not having used CPAP since then. Bedtime is 9:30 to 10 PM, sleep latency 5 minutes, awake 5 or 6 times during the night before up at 5 or 5:30 AM. Weight is stable. Medical history of hypertension, atrial fibrillation, chronic allergic rhinitis which has been unusually good this fall. He has had prostate cancer surgery. History of nasal polyps in 1984 without aspirin allergy. 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  09/20/11-  71 year old male former smoker referred courtesy of Dr. Wilburn Cornelia because of sleep apnea. Dr. Dagmar Hait is PCP. We had set up CPAP autotitration through [facility]. We got poor communication from them but apparently they still need a copy of his sleep study. He is not satisfied with their branch in [place] where he lives. We discussed changing home care companies. He did not feel that temazepam was adequate help for his sleep consolidation.  11/01/11- 71 year old male former smoker referred courtesy of Dr. Wilburn Cornelia because of sleep apnea. Dr. Dagmar Hait is PCP. Wearing cpap 7-8 hours a night. Patient states mask fits ok.  His wife is here. He doesn't see much difference in energy level with CPAP and still wakes during the night. Clonazepam  worked but he was reluctant to stay on it, preferring to put up with the insomnia/sleep fragmentation. Admits with CPAP his blood pressure has been lower. It bothers him because he doesn't sleep comfortably on his back and the tubing pulls as he shifts from side to side. Download from [place] did show good compliance with his autotitration. He is using a fullface mask. We discussed options for CPAP.  04/05/12- 71 year old male former smoker referred courtesy of Dr. Wilburn Cornelia because of sleep apnea. Dr. Dagmar Hait is PCP. Unable to see much difference with or without CPAP machine-wears CPAP/ autoset/ [facility] every night for approximately 7-8 hours His wife sleeps in a different bedroom because his snoring had been so bad before. He does not have her feedback about his snoring but admits he probably no longer snores with CPAP. He still feels drowsy if he sits quietly. He thinks he is getting enough sleep at night.  ROS-see HPI Constitutional:   No-   weight loss, night sweats, fevers, chills, +fatigue, lassitude.  HEENT:   No-  headaches, difficulty swallowing, tooth/dental problems, sore throat,       No-  sneezing, itching, ear ache, nasal congestion, post nasal drip,  CV:  No-   chest pain, orthopnea, PND, swelling in lower extremities, anasarca, dizziness, palpitations Resp: No-   shortness of breath with exertion or at rest.              No-   productive cough,  No non-productive cough,  No- coughing up of blood.  No-   change in color of mucus.  No- wheezing.   Skin: No-   rash or lesions. GI:  No-   heartburn, indigestion, abdominal pain, nausea, vomiting,  GU:  MS:  No-   joint pain or swelling.  . Neuro-     nothing unusual Psych:  No- change in mood or affect. No depression or anxiety.  No memory loss.  OBJ BP 132/80  Pulse 50  Ht 5' 10"  (1.778 m)  Wt 209 lb 6.4 oz (94.983 kg)  BMI 30.05 kg/m2  SpO2 98%  General- Alert, Oriented, Affect-appropriate,  Distress- none acute, medium build Skin- rash-none, lesions- none, excoriation- none Lymphadenopathy- none Head- atraumatic            Eyes- Gross vision intact, PERRLA, conjunctivae clear secretions            Ears- Hearing, canals-normal            Nose- narrow with considerable mucus, no-Septal dev, , polyps, erosion, perforation             Throat- Mallampati III-IV , mucosa clear , drainage- none, tonsils- atrophic Neck- flexible , trachea midline, no stridor , thyroid nl, carotid no bruit Chest - symmetrical excursion , unlabored           Heart/CV- RRR , no murmur , no gallop  , no rub, nl s1 s2                           - JVD- none , edema- none, stasis changes- none, varices- none           Lung- clear to P&A, wheeze- none, cough- none , dullness-none, rub- none           Chest wall-  Abd-  Br/ Gen/ Rectal- Not done, not indicated Extrem- cyanosis- none, clubbing, none, atrophy- none, strength- nl Neuro- grossly intact to observation

## 2012-04-05 NOTE — Patient Instructions (Addendum)
Continue CPAP   Sample and script Nuvigil 150 mg, 1/2 or 1 each morning when needed.

## 2012-04-08 NOTE — Assessment & Plan Note (Signed)
Using CPAP autotitration. He understands supposed to be good for his heart but he doesn't perceive much difference in how he feels during the day. Still notices drowsiness if he sits quietly. 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

## 2012-07-09 DIAGNOSIS — M1990 Unspecified osteoarthritis, unspecified site: Secondary | ICD-10-CM | POA: Insufficient documentation

## 2012-07-09 DIAGNOSIS — G528 Disorders of other specified cranial nerves: Secondary | ICD-10-CM

## 2012-07-09 HISTORY — DX: Disorders of other specified cranial nerves: G52.8

## 2012-08-03 ENCOUNTER — Other Ambulatory Visit: Payer: Self-pay | Admitting: Cardiology

## 2012-09-24 ENCOUNTER — Other Ambulatory Visit: Payer: Self-pay | Admitting: Dermatology

## 2012-10-08 ENCOUNTER — Encounter: Payer: Self-pay | Admitting: Internal Medicine

## 2012-10-08 ENCOUNTER — Ambulatory Visit (INDEPENDENT_AMBULATORY_CARE_PROVIDER_SITE_OTHER): Payer: Medicare Other | Admitting: Internal Medicine

## 2012-10-08 VITALS — BP 108/68 | HR 110 | Ht 70.0 in | Wt 215.0 lb

## 2012-10-08 DIAGNOSIS — G4733 Obstructive sleep apnea (adult) (pediatric): Secondary | ICD-10-CM

## 2012-10-08 DIAGNOSIS — Z23 Encounter for immunization: Secondary | ICD-10-CM

## 2012-10-08 MED ORDER — METHYLPHENIDATE HCL 5 MG PO TABS: 5.0000 mg | ORAL_TABLET | Freq: Two times a day (BID) | ORAL | Status: DC

## 2012-10-08 NOTE — Patient Instructions (Addendum)
We can continue CPAP autopap/ [facility] for ritalin 5 mg tabs- try for occasional help with alertness if needed  Flu vax

## 2012-10-08 NOTE — Progress Notes (Signed)
08/09/11- 71 year old male former smoker referred courtesy of Dr. Wilburn Cornelia because of sleep apnea. Dr. Dagmar Hait is PCP. Mr. Boyd has a history of loud snoring and difficulty breathing through his nose. In the spring of this year he had surgical reduction of turbinates which improved nasal airway flow but didn't change his sleep a great deal. NPSG at the [facility] on 05/17/2011 demonstrated moderate obstructive and central sleep apnea, AHI 27.1 per hour. Sleep was quite fragmented. CPAP was titrated to 11 which clear the obstructive events, bleeding frequent central events. A sleep study 2 years ago at [place] [facility] documented obstructive apnea. He could not get a mask to fit and gave up, not having used CPAP since then. Bedtime is 9:30 to 10 PM, sleep latency 5 minutes, awake 5 or 6 times during the night before up at 5 or 5:30 AM. Weight is stable. Medical history of hypertension, atrial fibrillation, chronic allergic rhinitis which has been unusually good this fall. He has had prostate cancer surgery. History of nasal polyps in 1984 without aspirin allergy. 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  09/20/11-  71 year old male former smoker referred courtesy of Dr. Wilburn Cornelia because of sleep apnea. Dr. Dagmar Hait is PCP. We had set up CPAP autotitration through [facility]. We got poor communication from them but apparently they still need a copy of his sleep study. He is not satisfied with their branch in [place] where he lives. We discussed changing home care companies. He did not feel that temazepam was adequate help for his sleep consolidation.  11/01/11- 71 year old male former smoker referred courtesy of Dr. Wilburn Cornelia because of sleep apnea. Dr. Dagmar Hait is PCP. Wearing cpap 7-8 hours a night. Patient states mask fits ok.  His wife is here. He doesn't see much difference in energy level with CPAP and still wakes during the night. Clonazepam  worked but he was reluctant to stay on it, preferring to put up with the insomnia/sleep fragmentation. Admits with CPAP his blood pressure has been lower. It bothers him because he doesn't sleep comfortably on his back and the tubing pulls as he shifts from side to side. Download from [place] did show good compliance with his autotitration. He is using a fullface mask. We discussed options for CPAP.  04/05/12- 71 year old male former smoker referred courtesy of Dr. Wilburn Cornelia because of sleep apnea. Dr. Dagmar Hait is PCP. Unable to see much difference with or without CPAP machine-wears CPAP/ autoset/ [facility] every night for approximately 7-8 hours His wife sleeps in a different bedroom because his snoring had been so bad before. He does not have her feedback about his snoring but admits he probably no longer snores with CPAP. He still feels drowsy if he sits quietly. He thinks he is getting enough sleep at night.  10/08/12-71 year old male former smoker followed for sleep apnea. Dr. Dagmar Hait is PCP. FOLLOWS FOR: wears CPAP/ autoPAP/ [place] every night and only a slight difference noticed since wearing it; wears 7-8 hours each night Asks flu vax. He doesn't recognize much difference in how he feels wearing CPAP. Sleeps okay. Still some daytime sleepiness and dislikes falling asleep in church. Wife is in a separate room. nuvigil was too expensive and we discussed alternatives. He avoids caffeine because of his atrial fibrillation.  ROS-see HPI Constitutional:   No-   weight loss, night sweats, fevers, chills, +fatigue, lassitude.  HEENT:   No-  headaches, difficulty swallowing, tooth/dental problems, sore throat,  No-  sneezing, itching, ear ache, nasal congestion, post nasal drip,  CV:  No-   chest pain, orthopnea, PND, swelling in lower extremities, anasarca, dizziness, palpitations Resp: No-   shortness of breath with exertion or at rest.              No-   productive  cough,  No non-productive cough,  No- coughing up of blood.              No-   change in color of mucus.  No- wheezing.   Skin: No-   rash or lesions. GI:  No-   heartburn, indigestion, abdominal pain, nausea, vomiting,  GU:  MS:  No-   joint pain or swelling.  . Neuro-     nothing unusual Psych:  No- change in mood or affect. No depression or anxiety.  No memory loss.  OBJ BP 108/68  Pulse 110  Ht 5' 10"  (1.778 m)  Wt 215 lb (97.523 kg)  BMI 30.85 kg/m2  SpO2 98% General- Alert, Oriented, Affect-appropriate, Distress- none acute, medium build Skin- rash-none, lesions- none, excoriation- none Lymphadenopathy- none Head- atraumatic            Eyes- Gross vision intact, PERRLA, conjunctivae clear secretions            Ears- Hearing, canals-normal            Nose- narrow with considerable mucus, no-Septal dev, , polyps, erosion, perforation             Throat- Mallampati III-IV , mucosa clear , drainage- none, tonsils- atrophic Neck- flexible , trachea midline, no stridor , thyroid nl, carotid no bruit Chest - symmetrical excursion , unlabored           Heart/CV- RRR/ controlled , no murmur , no gallop  , no rub, nl s1 s2                           - JVD- none , edema- none, stasis changes- none, varices- none           Lung- clear to P&A, wheeze- none, cough- none , dullness-none, rub- none           Chest wall-  Abd-  Br/ Gen/ Rectal- Not done, not indicated Extrem- cyanosis- none, clubbing, none, atrophy- none, strength- nl Neuro- grossly intact to observation

## 2012-10-20 ENCOUNTER — Encounter: Payer: Self-pay | Admitting: Internal Medicine

## 2012-10-20 NOTE — Assessment & Plan Note (Signed)
He is willing to continue CPAP with autotitration. He does not describe problems with sleep hygiene. He is willing to try Ritalin cautiously, watching for impact on his heart rhythm control versus impact on his daytime sleepiness. I compared Ritalin to caffeine.

## 2012-11-06 ENCOUNTER — Other Ambulatory Visit: Payer: Self-pay

## 2012-11-06 MED ORDER — METOPROLOL TARTRATE 25 MG PO TABS: 25.0000 mg | ORAL_TABLET | Freq: Two times a day (BID) | ORAL | Status: DC

## 2012-11-30 ENCOUNTER — Ambulatory Visit (INDEPENDENT_AMBULATORY_CARE_PROVIDER_SITE_OTHER): Payer: 59 | Admitting: Cardiology

## 2012-11-30 ENCOUNTER — Encounter: Payer: Self-pay | Admitting: Cardiology

## 2012-11-30 VITALS — BP 116/72 | HR 51 | Ht 70.0 in | Wt 212.0 lb

## 2012-11-30 DIAGNOSIS — K5190 Ulcerative colitis, unspecified, without complications: Secondary | ICD-10-CM

## 2012-11-30 DIAGNOSIS — I10 Essential (primary) hypertension: Secondary | ICD-10-CM

## 2012-11-30 DIAGNOSIS — I4891 Unspecified atrial fibrillation: Secondary | ICD-10-CM

## 2012-11-30 DIAGNOSIS — E785 Hyperlipidemia, unspecified: Secondary | ICD-10-CM

## 2012-11-30 DIAGNOSIS — G4733 Obstructive sleep apnea (adult) (pediatric): Secondary | ICD-10-CM

## 2012-11-30 NOTE — Progress Notes (Signed)
   HPI:  The patient is in for a follow up visit.  He is doing pretty well, is pretty much able to do what he wants.  He notes that he is occasionally out of rhythm, but it does not bother him.  He states that he would like to avoid anticoagulation.  We discussed this thoroughly.  His original colitis diagnosis was made due to bloody stools.  He does take a single ASA daily.  He is treated for sleep apnea, and due to his daytime sleepiness, Dr. Maple Hudson apparently placed him on ritalin.   Current Outpatient Prescriptions  Medication Sig Dispense Refill  . aspirin 81 MG tablet Take 81 mg by mouth daily.      . irbesartan (AVAPRO) 300 MG tablet Take 1 tablet by mouth daily.      . methylphenidate (RITALIN) 5 MG tablet Take 1 tablet (5 mg total) by mouth 2 (two) times daily. If needed for alertness  10 tablet  0  . metoprolol tartrate (LOPRESSOR) 25 MG tablet Take 1 tablet (25 mg total) by mouth 2 (two) times daily.  60 tablet  2  . sulfaSALAzine (AZULFIDINE) 500 MG tablet 500 mg. Take 4 tablets twice a day        No current facility-administered medications for this visit.    No Known Allergies  Past Medical History  Diagnosis Date  . Hypertension   . Colitis   . Sleep apnea   . Arrhythmia   . Arthritis   . Prostate cancer     in remission  . Heart murmur     Past Surgical History  Procedure Laterality Date  . Back surgery    . Prostatectomy    . Nasal sinus surgery    . Leg surgery      right    Family History  Problem Relation Age of Onset  . Parkinsonism Father 57    deceased  . Dementia Mother 18    deceased  . Hypertension Brother 73    alive  . Hypertension Sister 41    alive  . Hypothyroidism Sister     History   Social History  . Marital Status: Married    Spouse Name: Da Authement    Number of Children: 2  . Years of Education: N/A   Occupational History  . retired     Education officer, environmental  .     Social History Main Topics  . Smoking  status: Former Smoker -- 0.50 packs/day for 15 years    Types: Cigarettes    Quit date: 10/10/1969  . Smokeless tobacco: Not on file  . Alcohol Use: No  . Drug Use: No  . Sexually Active: Not on file   Other Topics Concern  . Not on file   Social History Narrative  . No narrative on file    ROS: Please see the HPI.  All other systems reviewed and negative.  PHYSICAL EXAM:  BP 116/72  Pulse 51  Ht 5\' 10"  (1.778 m)  Wt 212 lb (96.163 kg)  BMI 30.42 kg/m2  SpO2 98%  General: Well developed, well nourished, in no acute distress. Head:  Normocephalic and atraumatic. Neck: no JVD Lungs: Clear to auscultation and percussion. Heart: Normal S1 and S2.  No murmur, rubs or gallops.  Pulses: Pulses normal in all 4 extremities. Extremities: No clubbing or cyanosis. No edema. Neurologic: Alert and oriented x 3.  EKG:  SB.  Otherwise normal.    ASSESSMENT AND PLAN:

## 2012-11-30 NOTE — Patient Instructions (Signed)
Your physician recommends that you schedule a follow-up appointment in: 2 MONTHS with Dr McLean (previous pt of Dr Stuckey)  Your physician recommends that you continue on your current medications as directed. Please refer to the Current Medication list given to you today.   

## 2012-12-02 NOTE — Assessment & Plan Note (Signed)
Under the care of his primary MD.

## 2012-12-02 NOTE — Assessment & Plan Note (Signed)
Currently quiet per the patient.  No evidence of on going bleeding.

## 2012-12-02 NOTE — Assessment & Plan Note (Signed)
Patient remains stable, doing well, and in NSR.  He has occasional Af.  His risk score is relatively low, and he does not want to use anticoagulants except ASA.  He also does have colitis which raises his risk of lower bleeding.  He does have sleep apnea and is under treatment from Dr. Maple Hudson.

## 2012-12-02 NOTE — Assessment & Plan Note (Signed)
The patient is being followed by Dr. Maple Hudson.

## 2012-12-03 ENCOUNTER — Encounter (HOSPITAL_COMMUNITY): Payer: Self-pay | Admitting: Internal Medicine

## 2013-02-05 ENCOUNTER — Ambulatory Visit (INDEPENDENT_AMBULATORY_CARE_PROVIDER_SITE_OTHER): Payer: 59 | Admitting: Cardiology

## 2013-02-05 ENCOUNTER — Encounter: Payer: Self-pay | Admitting: Cardiology

## 2013-02-05 VITALS — BP 118/70 | HR 53 | Ht 70.0 in | Wt 208.0 lb

## 2013-02-05 DIAGNOSIS — I4891 Unspecified atrial fibrillation: Secondary | ICD-10-CM

## 2013-02-05 DIAGNOSIS — R011 Cardiac murmur, unspecified: Secondary | ICD-10-CM | POA: Insufficient documentation

## 2013-02-05 DIAGNOSIS — I10 Essential (primary) hypertension: Secondary | ICD-10-CM

## 2013-02-05 HISTORY — DX: Cardiac murmur, unspecified: R01.1

## 2013-02-05 LAB — BASIC METABOLIC PANEL
BUN: 15 mg/dL (ref 6–23)
Calcium: 9 mg/dL (ref 8.4–10.5)
Chloride: 106 mEq/L (ref 96–112)
Creatinine, Ser: 0.8 mg/dL (ref 0.4–1.5)

## 2013-02-05 LAB — CBC WITH DIFFERENTIAL/PLATELET
Eosinophils Absolute: 0.1 10*3/uL (ref 0.0–0.7)
Eosinophils Relative: 1.6 % (ref 0.0–5.0)
Lymphocytes Relative: 28.9 % (ref 12.0–46.0)
MCHC: 34 g/dL (ref 30.0–36.0)
MCV: 85.9 fl (ref 78.0–100.0)
Monocytes Absolute: 0.7 10*3/uL (ref 0.1–1.0)
Neutrophils Relative %: 54.4 % (ref 43.0–77.0)
Platelets: 239 10*3/uL (ref 150.0–400.0)
WBC: 4.9 10*3/uL (ref 4.5–10.5)

## 2013-02-05 MED ORDER — APIXABAN 5 MG PO TABS: 5.0000 mg | ORAL_TABLET | Freq: Two times a day (BID) | ORAL | Status: DC

## 2013-02-05 NOTE — Progress Notes (Signed)
Patient ID: Daniel Pepper., male   DOB: [birth year], 72 y.o.   MRN: [id number] PCP: Dr. Dagmar Hait  72 yo with history of HTN, OSA, ulcerative colitis, and paroxysmal atrial fibrillation presents for cardiology followup.  Patient has been seen by Dr. Lia Foyer in the past and is seen by me for the first time today.  I have reviewed his old records.  His main complaint is daytime sleepiness that has continued despite CPAP use.  He has had his CPAP adjusted without much result.  He is fairly active, doing yardwork and gardening without exertional dyspnea or chest pain.  He feels when he is in atrial fibrillation.  He will not an irregular heart rate every couple of months.  Usually this lasts no more than a few hours.  It is often triggered by caffeine.  He is in NSR today.  He is not anticoagulated.  His ulcerative colitis has been quiescent, no GI bleeding.   PMH: 1. Hyperlipidemia 2. OSA on CPAP 3. HTN 4. Ulcerative colitis: Quiescent 5. Prostate cancer s/p prostatectomy 6. Back surgery  7. Paroxysmal atrial fibrillation: Echo (3/12) with EF 65-70%, mild Daniel, moderate TR, PA systolic pressure 37 mmHg.  FH: HTN, parkinsons  SH: Married, lives near [place], retired from [employer], quit smoking in [place], 2 children.   ROS: All systems reviewed and negative except as per HPI.   Current Outpatient Prescriptions  Medication Sig Dispense Refill  . irbesartan (AVAPRO) 300 MG tablet Take 1 tablet by mouth daily.      . metoprolol tartrate (LOPRESSOR) 25 MG tablet Take 1 tablet (25 mg total) by mouth 2 (two) times daily.  60 tablet  2  . sulfaSALAzine (AZULFIDINE) 500 MG tablet 500 mg. Take 4 tablets twice a day       . apixaban (ELIQUIS) 5 MG TABS tablet Take 1 tablet (5 mg total) by mouth 2 (two) times daily.  60 tablet  2   No current facility-administered medications for this visit.    BP 118/70  Pulse 53  Ht 5' 10"  (1.778 m)  Wt 208 lb (94.348 kg)  BMI 29.84 kg/m2  SpO2 97% General:  NAD Neck: No JVD, no thyromegaly or thyroid nodule.  Lungs: Clear to auscultation bilaterally with normal respiratory effort. CV: Nondisplaced PMI.  Heart regular S1/S2, no S3/S4, 2/6 HSM at apex.  1+ chronic right ankle edema.  No carotid bruit.  Normal pedal pulses.  Abdomen: Soft, nontender, no hepatosplenomegaly, no distention.  Skin: Intact without lesions or rashes.  Neurologic: Alert and oriented x 3.  Psych: Normal affect. Extremities: No clubbing or cyanosis.   Assessment/Plan: 1. Paroxysmal atrial fibrillation: CHADSVASC score = 2 (age, HTN).  His ulcerative colitis has been quiscent with no bleeding x years.  I think that Daniel Dalton would be a reasonable candidate for apixaban.  I do not think that his bleeding risk will be much more than it would be on aspirin. - Stop ASA, start apixaban 5 mg bid.  - Check CBC and BMET today prior to initiating apixaban.  Will followup in our anticoagulation clinic in a month for NOAC monitoring.  - He will continue metoprolol.  I do not think that he needs anti-arrhythmic therapy.   2. Murmur: Patient has a mitral-area murmur.  He has a history of Daniel and TR.  I will get an echo to reassess his valves.   3. Followup in 6 months.    [name] 02/05/2013

## 2013-02-05 NOTE — Patient Instructions (Addendum)
Stop aspirin.   Start Eliquis(apixiban) 5mg  two times a day.  Your physician recommends that you have  lab work today--BMET/CBCd   Your physician has requested that you have an echocardiogram. Echocardiography is a painless test that uses sound waves to create images of your heart. It provides your doctor with information about the size and shape of your heart and how well your heart's chambers and valves are working. This procedure takes approximately one hour. There are no restrictions for this procedure.  You have been referred to [facility] --needs appt in 1 month. Eliquis 5mg  two times a day started 02/05/13   Your physician wants you to follow-up in: 6 months with Dr Shirlee Latch. (October 2014). You will receive a reminder letter in the mail two months in advance. If you don't receive a letter, please call our office to schedule the follow-up appointment.

## 2013-02-06 ENCOUNTER — Telehealth: Payer: Self-pay | Admitting: Nurse Practitioner

## 2013-02-06 MED ORDER — METOPROLOL TARTRATE 25 MG PO TABS: 25.0000 mg | ORAL_TABLET | Freq: Two times a day (BID) | ORAL | Status: DC

## 2013-02-06 NOTE — Telephone Encounter (Signed)
Patient called with concerns about medications.  First concern was that he called and asked for a refill of his Lopressor and his pharmacy does not have it.  I have reordered his Lopressor and verified patient's pharmacy with him.  Second concern is that Eliquis will cost him $40 per month and he is unwilling to pay this.  He wants to remain on ASA only or will consider taking something that is cheaper.  I told him I would notify Dr. Aundra Dubin.

## 2013-02-07 NOTE — Telephone Encounter (Signed)
Pharmacy was called and pt did pick up lopressor. Pharmacy said the xarelto would be in the same class as the eliquis being it is name brand and no generic.  I called the pt, I informed him of the medication price, pt declined eliquis and xarelto. Discussed coumadin and it would be inexpensive to use but would have regular lab draws. Pt was agitated/ declined use, stating he will do ASA therapy. I will forward to DR/Nurse to inform.

## 2013-02-07 NOTE — Telephone Encounter (Signed)
Can we see what Xarelto 20 would cost for him? Other option would be coumadin but would have to have INRs monitored.

## 2013-02-08 ENCOUNTER — Other Ambulatory Visit: Payer: Self-pay | Admitting: *Deleted

## 2013-02-11 ENCOUNTER — Ambulatory Visit (HOSPITAL_COMMUNITY): Payer: Medicare Other | Attending: Cardiology | Admitting: Radiology

## 2013-02-11 DIAGNOSIS — I4891 Unspecified atrial fibrillation: Secondary | ICD-10-CM

## 2013-02-11 DIAGNOSIS — R011 Cardiac murmur, unspecified: Secondary | ICD-10-CM

## 2013-02-11 NOTE — Progress Notes (Signed)
Echocardiogram performed.  

## 2013-02-13 ENCOUNTER — Telehealth: Payer: Self-pay | Admitting: Cardiology

## 2013-02-13 NOTE — Telephone Encounter (Signed)
Follow up  ° ° ° °Returning call back to nurse  °

## 2013-02-13 NOTE — Telephone Encounter (Signed)
Spoke with patient about echo results

## 2013-02-13 NOTE — Addendum Note (Signed)
Addended by: Jacqlyn Krauss on: 02/13/2013 11:47 AM   Modules accepted: Orders, Medications

## 2013-02-13 NOTE — Telephone Encounter (Signed)
I spoke with patient again about either Xarelto or Eliquis, either would be about $40 /month. He states he understands the stroke risk associated with at fib. He states that does not change his finances and he declined a prescription for either Xarelto or Eliquis again. He will take aspirin.

## 2013-04-02 ENCOUNTER — Ambulatory Visit (INDEPENDENT_AMBULATORY_CARE_PROVIDER_SITE_OTHER): Payer: Medicare Other | Admitting: Cardiovascular Disease

## 2013-04-02 DIAGNOSIS — I4891 Unspecified atrial fibrillation: Secondary | ICD-10-CM

## 2013-04-08 ENCOUNTER — Encounter: Payer: Self-pay | Admitting: Internal Medicine

## 2013-04-08 ENCOUNTER — Ambulatory Visit (INDEPENDENT_AMBULATORY_CARE_PROVIDER_SITE_OTHER): Payer: 59 | Admitting: Internal Medicine

## 2013-04-08 VITALS — BP 122/70 | HR 58 | Ht 70.0 in | Wt 213.2 lb

## 2013-04-08 DIAGNOSIS — G4733 Obstructive sleep apnea (adult) (pediatric): Secondary | ICD-10-CM

## 2013-04-08 NOTE — Patient Instructions (Addendum)
I will respect what you decide is the best approach for you. I can help you get another opinion elsewhere if you wish. Otherwise, as long as you are using CPAP, I can see you once a year to meet Medicare requirements, so you can continue to get supplies etc from the DME company  Dr Althea Grimmer is an orthodontist with a strong interest in oral appliances for sleep apnea

## 2013-04-08 NOTE — Progress Notes (Signed)
08/09/11- 72 year old male former smoker referred courtesy of Dr. Wilburn Cornelia because of sleep apnea. Dr. Dagmar Hait is PCP. Daniel Dalton has a history of loud snoring and difficulty breathing through his nose. In the spring of this year he had surgical reduction of turbinates which improved nasal airway flow but didn't change his sleep a great deal. NPSG at the [facility] on 05/17/2011 demonstrated moderate obstructive and central sleep apnea, AHI 27.1 per hour. Sleep was quite fragmented. CPAP was titrated to 11 which clear the obstructive events, bleeding frequent central events. A sleep study 2 years ago at [place] [facility] documented obstructive apnea. He could not get a mask to fit and gave up, not having used CPAP since then. Bedtime is 9:30 to 10 PM, sleep latency 5 minutes, awake 5 or 6 times during the night before up at 5 or 5:30 AM. Weight is stable. Medical history of hypertension, atrial fibrillation, chronic allergic rhinitis which has been unusually good this fall. He has had prostate cancer surgery. History of nasal polyps in 1984 without aspirin allergy. 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  09/20/11-  72 year old male former smoker referred courtesy of Dr. Wilburn Cornelia because of sleep apnea. Dr. Dagmar Hait is PCP. We had set up CPAP autotitration through [facility]. We got poor communication from them but apparently they still need a copy of his sleep study. He is not satisfied with their branch in [place] where he lives. We discussed changing home care companies. He did not feel that temazepam was adequate help for his sleep consolidation.  11/01/11- 72 year old male former smoker referred courtesy of Dr. Wilburn Cornelia because of sleep apnea. Dr. Dagmar Hait is PCP. Wearing cpap 7-8 hours a night. Patient states mask fits ok.  His wife is here. He doesn't see much difference in energy level with CPAP and still wakes during the night. Clonazepam  worked but he was reluctant to stay on it, preferring to put up with the insomnia/sleep fragmentation. Admits with CPAP his blood pressure has been lower. It bothers him because he doesn't sleep comfortably on his back and the tubing pulls as he shifts from side to side. Download from [place] did show good compliance with his autotitration. He is using a fullface mask. We discussed options for CPAP.  04/05/12- 72 year old male former smoker referred courtesy of Dr. Wilburn Cornelia because of sleep apnea. Dr. Dagmar Hait is PCP. Unable to see much difference with or without CPAP machine-wears CPAP/ autoset/ [facility] every night for approximately 7-8 hours His wife sleeps in a different bedroom because his snoring had been so bad before. He does not have her feedback about his snoring but admits he probably no longer snores with CPAP. He still feels drowsy if he sits quietly. He thinks he is getting enough sleep at night.  10/08/12-72 year old male former smoker followed for sleep apnea. Dr. Dagmar Hait is PCP. FOLLOWS FOR: wears CPAP/ autoPAP/ [place] every night and only a slight difference noticed since wearing it; wears 7-8 hours each night Asks flu vax. He doesn't recognize much difference in how he feels wearing CPAP. Sleeps okay. Still some daytime sleepiness and dislikes falling asleep in church. Wife is in a separate room. nuvigil was too expensive and we discussed alternatives. He avoids caffeine because of his atrial fibrillation.  04/08/13-  FOLLOWS FOR: patient reports wearing CPAP everynight, approx 7 hrs per night, tolerating settings well- c/o CPAP "not working,never has"  He admits he wears CPAP auto/ [employer]  and breathes better with it but gets no relief from his chronic complaint of daytime sleepiness. Nighttime sleep is fragmented and restless, frequently turning from side to side. His wife is in a separate room, not reporting if he snores. Tried clonazepam  without much benefit. He is not interested in further sleep studies or efforts to address this issue. Is not willing to take stimulants.  ROS-see HPI Constitutional:   No-   weight loss, night sweats, fevers, chills, +fatigue, lassitude.  HEENT:   No-  headaches, difficulty swallowing, tooth/dental problems, sore throat,       No-  sneezing, itching, ear ache, nasal congestion, post nasal drip,  CV:  No-   chest pain, orthopnea, PND, swelling in lower extremities, anasarca, dizziness, palpitations Resp: No-   shortness of breath with exertion or at rest.              No-   productive cough,  No non-productive cough,  No- coughing up of blood.              No-   change in color of mucus.  No- wheezing.   Skin: No-   rash or lesions. GI:  No-   heartburn, indigestion, abdominal pain, nausea, vomiting,  GU:  MS:  No-   joint pain or swelling.  . Neuro-     nothing unusual Psych:  No- change in mood or affect. No depression or anxiety.  No memory loss.  OBJ  General- Alert, Oriented, Affect-appropriate, Distress- none acute, medium build Skin- rash-none, lesions- none, excoriation- none Lymphadenopathy- none Head- atraumatic            Eyes- Gross vision intact, PERRLA, conjunctivae clear secretions            Ears- Hearing, canals-normal            Nose- narrow with considerable mucus, no-Septal dev, , polyps, erosion, perforation             Throat- Mallampati III-IV , mucosa clear , drainage- none, tonsils- atrophic Neck- flexible , trachea midline, no stridor , thyroid nl, carotid no bruit Chest - symmetrical excursion , unlabored           Heart/CV- RRR/ controlled , no murmur , no gallop  , no rub, nl s1 s2                           - JVD- none , edema- none, stasis changes- none, varices- none           Lung- clear to P&A, wheeze- none, cough- none , dullness-none, rub- none           Chest wall-  Abd-  Br/ Gen/ Rectal- Not done, not indicated Extrem- cyanosis- none, clubbing,  none, atrophy- none, strength- nl Neuro- grossly intact to observation

## 2013-04-21 ENCOUNTER — Encounter: Payer: Self-pay | Admitting: Internal Medicine

## 2013-04-21 NOTE — Assessment & Plan Note (Signed)
He is not interested in any further intervention for this problem. I offered referral to discuss alternatives including oral appliances but is not interested. He did decide he would come back in a year

## 2013-05-05 ENCOUNTER — Other Ambulatory Visit: Payer: Self-pay | Admitting: Cardiology

## 2013-08-12 ENCOUNTER — Ambulatory Visit (INDEPENDENT_AMBULATORY_CARE_PROVIDER_SITE_OTHER): Payer: Medicare Other | Admitting: Cardiology

## 2013-08-12 ENCOUNTER — Encounter: Payer: Self-pay | Admitting: Cardiology

## 2013-08-12 VITALS — BP 128/80 | HR 73 | Ht 70.5 in | Wt 213.0 lb

## 2013-08-12 DIAGNOSIS — I10 Essential (primary) hypertension: Secondary | ICD-10-CM

## 2013-08-12 DIAGNOSIS — I480 Paroxysmal atrial fibrillation: Secondary | ICD-10-CM | POA: Insufficient documentation

## 2013-08-12 DIAGNOSIS — I4891 Unspecified atrial fibrillation: Secondary | ICD-10-CM | POA: Insufficient documentation

## 2013-08-12 DIAGNOSIS — R011 Cardiac murmur, unspecified: Secondary | ICD-10-CM

## 2013-08-12 NOTE — Patient Instructions (Signed)
Your physician wants you to follow-up in: 1 year with Dr McLean. (November 2015). You will receive a reminder letter in the mail two months in advance. If you don't receive a letter, please call our office to schedule the follow-up appointment.  

## 2013-08-12 NOTE — Progress Notes (Signed)
Patient ID: Daniel Pepper., male   DOB: [birth year], 72 y.o.   MRN: [id number] PCP: Dr. Dagmar Hait  72 yo with history of HTN, OSA, ulcerative colitis, and paroxysmal atrial fibrillation presents for cardiology followup.  He continues to be fairly active, doing yardwork and gardening without exertional dyspnea or chest pain.  He feels when he is in atrial fibrillation.  He will note an irregular heart rate every couple of months but has actually not felt an episode recently.  Usually this lasts no more than a few hours when it occurs.  It is often triggered by caffeine.  He is in NSR today.  His ulcerative colitis has been quiescent, no GI bleeding.  He had an echo in 5/14 showing EF 55-60%, moderate diastolic dysfunction, moderate TR, mild Daniel, mild pulmonary hypertension.   At last appointment, I suggested that he start on apixaban 5 mg bid and stop ASA.  This would cost about $40/month and he said that this was too much.  I offered any of the other NOACs or coumadin, but he decided that he did not want to take any of them.  He has had no stroke-like symptoms.   ECG: NSR, normal  Labs (4/14): K 4.5, creatinine 0.8  PMH: 1. Hyperlipidemia 2. OSA on CPAP 3. HTN 4. Ulcerative colitis: Quiescent 5. Prostate cancer s/p prostatectomy 6. Back surgery  7. Paroxysmal atrial fibrillation: Echo (5/14) with EF 55-60%, moderate diastolic dysfunction, moderate TR, mild MR, PA systolic pressure 03-49 mmHg.  He has refused anticoagulation.   FH: HTN, parkinsons  SH: Married, lives near [place], retired from [employer], quit smoking in [place], 2 children.    Current Outpatient Prescriptions  Medication Sig Dispense Refill  . aspirin EC 81 MG tablet Take 1 tablet (81 mg total) by mouth daily.      . irbesartan (AVAPRO) 300 MG tablet Take 1 tablet by mouth daily.      . metoprolol tartrate (LOPRESSOR) 25 MG tablet TAKE ONE TABLET BY MOUTH TWICE DAILY  60 tablet  6  . sulfaSALAzine (AZULFIDINE) 500 MG tablet  Take 4 tablets twice a day       No current facility-administered medications for this visit.    BP 128/80  Pulse 73  Ht 5' 10.5" (1.791 m)  Wt 96.616 kg (213 lb)  BMI 30.12 kg/m2  SpO2 96% General: NAD Neck: No JVD, no thyromegaly or thyroid nodule.  Lungs: Clear to auscultation bilaterally with normal respiratory effort. CV: Nondisplaced PMI.  Heart regular S1/S2, no S3/S4, 1/6 HSM LLSB.  No edema.  No carotid bruit.  Normal pedal pulses.  Abdomen: Soft, nontender, no hepatosplenomegaly, no distention.  Skin: Intact without lesions or rashes.  Neurologic: Alert and oriented x 3.  Psych: Normal affect. Extremities: No clubbing or cyanosis.   Assessment/Plan: 1. Paroxysmal atrial fibrillation: CHADSVASC score = 2 (age, HTN).  His ulcerative colitis has been quiscent with no bleeding x years.  I think that Daniel Dalton would be a reasonable candidate for anticoagulation.  We again had a long discussion about the rationale behind anticoagulation.  He remains adamant that he does not want anticoagulation. We will continue him on ASA 81 for MI prevention and metoprolol.  2. Murmur: Moderate TR on last echo.     [name] 08/12/2013

## 2013-10-30 ENCOUNTER — Other Ambulatory Visit: Payer: Self-pay | Admitting: Neurosurgery

## 2013-11-07 ENCOUNTER — Encounter (HOSPITAL_COMMUNITY): Payer: Self-pay | Admitting: Pharmacy Technician

## 2013-11-14 ENCOUNTER — Encounter (HOSPITAL_COMMUNITY): Payer: Self-pay

## 2013-11-14 ENCOUNTER — Encounter (HOSPITAL_COMMUNITY)
Admission: RE | Admit: 2013-11-14 | Discharge: 2013-11-14 | Disposition: A | Discharge status: Home / Self Care | Payer: Medicare Other | Source: Ambulatory Visit | Attending: Neurosurgery | Admitting: Neurosurgery

## 2013-11-14 DIAGNOSIS — Z01812 Encounter for preprocedural laboratory examination: Secondary | ICD-10-CM | POA: Insufficient documentation

## 2013-11-14 HISTORY — DX: Family history of other specified conditions: Z84.89

## 2013-11-14 HISTORY — DX: Umbilical hernia without obstruction or gangrene: K42.9

## 2013-11-14 HISTORY — DX: Paroxysmal atrial fibrillation: I48.0

## 2013-11-14 HISTORY — DX: Personal history of urinary calculi: Z87.442

## 2013-11-14 LAB — SURGICAL PCR SCREEN
MRSA, PCR: NEGATIVE
Staphylococcus aureus: NEGATIVE

## 2013-11-14 LAB — CBC
HCT: 40.1 % (ref 39.0–52.0)
Hemoglobin: 13.2 g/dL (ref 13.0–17.0)
MCH: 29.5 pg (ref 26.0–34.0)
MCHC: 32.9 g/dL (ref 30.0–36.0)
MCV: 89.5 fL (ref 78.0–100.0)
Platelets: 247 10*3/uL (ref 150–400)
RBC: 4.48 MIL/uL (ref 4.22–5.81)
RDW: 14.2 % (ref 11.5–15.5)
WBC: 4 10*3/uL (ref 4.0–10.5)

## 2013-11-14 LAB — BASIC METABOLIC PANEL
BUN: 14 mg/dL (ref 6–23)
CALCIUM: 9.2 mg/dL (ref 8.4–10.5)
CO2: 25 mEq/L (ref 19–32)
CREATININE: 0.69 mg/dL (ref 0.50–1.35)
Chloride: 105 mEq/L (ref 96–112)
GFR calc non Af Amer: >90 mL/min (ref >90)
Glucose, Bld: 101 mg/dL — ABNORMAL HIGH (ref 70–99)
Potassium: 4.7 mEq/L (ref 3.7–5.3)
Sodium: 141 mEq/L (ref 137–147)

## 2013-11-14 NOTE — Progress Notes (Signed)
Requested CXR from [facility]

## 2013-11-15 NOTE — Progress Notes (Signed)
Anesthesia Chart Review:  Patient is a 73 year old male scheduled for C4-5, C5-6, C6-7 ACDF on 11/21/13 by Sherwood Gambler.    History includes former smoker, HTN, PAF (currently refusing any anticoagulation therapy), OSA with CPAP use, ulcerative colitis, prostate cancer s/p prostatectomy, nephrolithiasis, nasal sinus surgery, back surgery, right leg surgery, family history of post-operative N/V. PCP is Dr. Dagmar Hait.  Pulmonologist is Dr. Annamaria Boots. Cardiologist is Dr. Aundra Dubin, last visit 08/12/13.  EKG on 08/12/13 showed NSR. HR 64 bpm at PAT.  He is on metoprolol,  Echo on 02/11/13 showed: Normal LV size and systolic function, EF 36-64%. Moderate (grade II) diastolic dysfunction. Normal RV size and systolic function. Moderate tricuspid regurgitation, mild mitral regurgitation. Mild pulmonary hypertension. PA systolic pressure 40-34 mmHg.  CXR from PCP was > 1 year ago.  Plan for CXR on arrival.    Preoperative labs noted.    He has had recent cardiology follow-up and was maintaining SR then.  He remains on b-blocker therapy. If no acute changes then I anticipate that he can proceed as planned.  George Hugh [facility] Short Stay Center/Anesthesiology Phone [phone] 11/15/2013 4:05 PM

## 2013-11-20 MED ORDER — CEFAZOLIN SODIUM-DEXTROSE 2-3 GM-% IV SOLR
2.0000 g | INTRAVENOUS | Status: AC
  Administered 2013-11-21: 2 g via INTRAVENOUS
  Filled 2013-11-20: qty 50

## 2013-11-21 ENCOUNTER — Encounter (HOSPITAL_COMMUNITY)
Admission: RE | Disposition: A | Discharge status: Home / Self Care | Payer: Self-pay | Source: Ambulatory Visit | Attending: Neurosurgery

## 2013-11-21 ENCOUNTER — Inpatient Hospital Stay (HOSPITAL_COMMUNITY): Payer: Medicare Other

## 2013-11-21 ENCOUNTER — Inpatient Hospital Stay (HOSPITAL_COMMUNITY)
Admission: RE | Admit: 2013-11-21 | Discharge: 2013-11-22 | DRG: 473 | Disposition: A | Discharge status: Home / Self Care | Payer: Medicare Other | Source: Ambulatory Visit | Attending: Neurosurgery | Admitting: Neurosurgery

## 2013-11-21 ENCOUNTER — Encounter (HOSPITAL_COMMUNITY): Payer: Self-pay | Admitting: *Deleted

## 2013-11-21 ENCOUNTER — Inpatient Hospital Stay (HOSPITAL_COMMUNITY): Payer: Medicare Other | Admitting: Certified Registered Nurse Anesthetist

## 2013-11-21 ENCOUNTER — Encounter (HOSPITAL_COMMUNITY): Payer: Medicare Other | Admitting: Vascular Surgery

## 2013-11-21 DIAGNOSIS — I10 Essential (primary) hypertension: Secondary | ICD-10-CM | POA: Diagnosis present

## 2013-11-21 DIAGNOSIS — Z8249 Family history of ischemic heart disease and other diseases of the circulatory system: Secondary | ICD-10-CM

## 2013-11-21 DIAGNOSIS — G4733 Obstructive sleep apnea (adult) (pediatric): Secondary | ICD-10-CM | POA: Diagnosis present

## 2013-11-21 DIAGNOSIS — M5020 Other cervical disc displacement, unspecified cervical region: Secondary | ICD-10-CM | POA: Diagnosis present

## 2013-11-21 DIAGNOSIS — Z7982 Long term (current) use of aspirin: Secondary | ICD-10-CM

## 2013-11-21 DIAGNOSIS — M47812 Spondylosis without myelopathy or radiculopathy, cervical region: Principal | ICD-10-CM | POA: Diagnosis present

## 2013-11-21 DIAGNOSIS — Z87891 Personal history of nicotine dependence: Secondary | ICD-10-CM

## 2013-11-21 DIAGNOSIS — M5030 Other cervical disc degeneration, unspecified cervical region: Secondary | ICD-10-CM | POA: Diagnosis present

## 2013-11-21 HISTORY — PX: ANTERIOR CERVICAL DECOMP/DISCECTOMY FUSION: SHX1161

## 2013-11-21 HISTORY — DX: Other cervical disc displacement, unspecified cervical region: M50.20

## 2013-11-21 SURGERY — ANTERIOR CERVICAL DECOMPRESSION/DISCECTOMY FUSION 3 LEVELS
Anesthesia: General | Site: Neck

## 2013-11-21 MED ORDER — MENTHOL 3 MG MT LOZG: 1.0000 | LOZENGE | OROMUCOSAL | Status: DC | PRN

## 2013-11-21 MED ORDER — ACETAMINOPHEN 325 MG PO TABS: 650.0000 mg | ORAL_TABLET | ORAL | Status: DC | PRN

## 2013-11-21 MED ORDER — STERILE WATER FOR INJECTION IJ SOLN
INTRAMUSCULAR | Status: AC
  Filled 2013-11-21: qty 10

## 2013-11-21 MED ORDER — ACETAMINOPHEN 650 MG RE SUPP: 650.0000 mg | RECTAL | Status: DC | PRN

## 2013-11-21 MED ORDER — PHENYLEPHRINE HCL 10 MG/ML IJ SOLN
INTRAMUSCULAR | Status: DC | PRN
  Administered 2013-11-21 (×3): 40 ug via INTRAVENOUS

## 2013-11-21 MED ORDER — FENTANYL CITRATE 0.05 MG/ML IJ SOLN
INTRAMUSCULAR | Status: DC | PRN
  Administered 2013-11-21: 150 ug via INTRAVENOUS
  Administered 2013-11-21 (×3): 50 ug via INTRAVENOUS

## 2013-11-21 MED ORDER — 0.9 % SODIUM CHLORIDE (POUR BTL) OPTIME
TOPICAL | Status: DC | PRN
  Administered 2013-11-21: 1000 mL

## 2013-11-21 MED ORDER — LIDOCAINE-EPINEPHRINE 1 %-1:100000 IJ SOLN
INTRAMUSCULAR | Status: DC | PRN
  Administered 2013-11-21: 6 mL

## 2013-11-21 MED ORDER — PHENOL 1.4 % MT LIQD: 1.0000 | OROMUCOSAL | Status: DC | PRN

## 2013-11-21 MED ORDER — OXYCODONE HCL 5 MG/5ML PO SOLN: 5.0000 mg | Freq: Once | ORAL | Status: DC | PRN

## 2013-11-21 MED ORDER — IRBESARTAN 300 MG PO TABS
300.0000 mg | ORAL_TABLET | Freq: Every day | ORAL | Status: DC
  Filled 2013-11-21 (×2): qty 1

## 2013-11-21 MED ORDER — KETOROLAC TROMETHAMINE 30 MG/ML IJ SOLN
30.0000 mg | Freq: Four times a day (QID) | INTRAMUSCULAR | Status: DC
  Administered 2013-11-21 – 2013-11-22 (×3): 30 mg via INTRAVENOUS
  Filled 2013-11-21 (×7): qty 1

## 2013-11-21 MED ORDER — ACETAMINOPHEN 10 MG/ML IV SOLN
INTRAVENOUS | Status: AC
  Administered 2013-11-21: 1000 mg via INTRAVENOUS
  Filled 2013-11-21: qty 100

## 2013-11-21 MED ORDER — LACTATED RINGERS IV SOLN
INTRAVENOUS | Status: DC | PRN
  Administered 2013-11-21 (×2): via INTRAVENOUS

## 2013-11-21 MED ORDER — MORPHINE SULFATE 4 MG/ML IJ SOLN: 4.0000 mg | INTRAMUSCULAR | Status: DC | PRN

## 2013-11-21 MED ORDER — METOPROLOL TARTRATE 25 MG PO TABS
25.0000 mg | ORAL_TABLET | Freq: Two times a day (BID) | ORAL | Status: DC
  Administered 2013-11-21: 25 mg via ORAL
  Filled 2013-11-21 (×3): qty 1

## 2013-11-21 MED ORDER — THROMBIN 20000 UNITS EX SOLR
CUTANEOUS | Status: DC | PRN
  Administered 2013-11-21: 12:00:00 via TOPICAL

## 2013-11-21 MED ORDER — GLYCOPYRROLATE 0.2 MG/ML IJ SOLN
INTRAMUSCULAR | Status: DC | PRN
  Administered 2013-11-21: 1 mg via INTRAVENOUS

## 2013-11-21 MED ORDER — BISACODYL 10 MG RE SUPP: 10.0000 mg | Freq: Every day | RECTAL | Status: DC | PRN

## 2013-11-21 MED ORDER — KETOROLAC TROMETHAMINE 30 MG/ML IJ SOLN
30.0000 mg | Freq: Once | INTRAMUSCULAR | Status: AC
  Administered 2013-11-21: 30 mg via INTRAVENOUS

## 2013-11-21 MED ORDER — NEOSTIGMINE METHYLSULFATE 1 MG/ML IJ SOLN
INTRAMUSCULAR | Status: DC | PRN
  Administered 2013-11-21: 5 mg via INTRAVENOUS

## 2013-11-21 MED ORDER — GELATIN ABSORBABLE MT POWD
OROMUCOSAL | Status: DC | PRN
  Administered 2013-11-21: 12:00:00 via TOPICAL

## 2013-11-21 MED ORDER — CYCLOBENZAPRINE HCL 10 MG PO TABS
10.0000 mg | ORAL_TABLET | Freq: Three times a day (TID) | ORAL | Status: DC | PRN
Start: 2013-11-21 — End: 2013-11-22

## 2013-11-21 MED ORDER — SODIUM CHLORIDE 0.9 % IR SOLN
Status: DC | PRN
  Administered 2013-11-21: 12:00:00

## 2013-11-21 MED ORDER — MEPERIDINE HCL 25 MG/ML IJ SOLN: 6.2500 mg | INTRAMUSCULAR | Status: DC | PRN

## 2013-11-21 MED ORDER — FENTANYL CITRATE 0.05 MG/ML IJ SOLN
INTRAMUSCULAR | Status: AC
  Filled 2013-11-21: qty 5

## 2013-11-21 MED ORDER — LIDOCAINE HCL (CARDIAC) 20 MG/ML IV SOLN
INTRAVENOUS | Status: AC
  Filled 2013-11-21: qty 5

## 2013-11-21 MED ORDER — PROPOFOL 10 MG/ML IV BOLUS
INTRAVENOUS | Status: DC | PRN
  Administered 2013-11-21: 100 mg via INTRAVENOUS

## 2013-11-21 MED ORDER — MAGNESIUM HYDROXIDE 400 MG/5ML PO SUSP: 30.0000 mL | Freq: Every day | ORAL | Status: DC | PRN

## 2013-11-21 MED ORDER — BUPIVACAINE HCL (PF) 0.5 % IJ SOLN
INTRAMUSCULAR | Status: DC | PRN
  Administered 2013-11-21: 6 mL

## 2013-11-21 MED ORDER — PHENYLEPHRINE 40 MCG/ML (10ML) SYRINGE FOR IV PUSH (FOR BLOOD PRESSURE SUPPORT)
PREFILLED_SYRINGE | INTRAVENOUS | Status: AC
  Filled 2013-11-21: qty 10

## 2013-11-21 MED ORDER — ONDANSETRON HCL 4 MG/2ML IJ SOLN
INTRAMUSCULAR | Status: DC | PRN
  Administered 2013-11-21: 4 mg via INTRAVENOUS

## 2013-11-21 MED ORDER — VECURONIUM BROMIDE 10 MG IV SOLR
INTRAVENOUS | Status: DC | PRN
  Administered 2013-11-21: 2 mg via INTRAVENOUS
  Administered 2013-11-21: 1 mg via INTRAVENOUS

## 2013-11-21 MED ORDER — SULFASALAZINE 500 MG PO TABS
2000.0000 mg | ORAL_TABLET | Freq: Two times a day (BID) | ORAL | Status: DC
  Administered 2013-11-21: 2000 mg via ORAL
  Filled 2013-11-21 (×3): qty 4

## 2013-11-21 MED ORDER — MIDAZOLAM HCL 5 MG/5ML IJ SOLN
INTRAMUSCULAR | Status: DC | PRN
  Administered 2013-11-21: 2 mg via INTRAVENOUS

## 2013-11-21 MED ORDER — ROCURONIUM BROMIDE 50 MG/5ML IV SOLN
INTRAVENOUS | Status: AC
  Filled 2013-11-21: qty 1

## 2013-11-21 MED ORDER — ARTIFICIAL TEARS OP OINT
TOPICAL_OINTMENT | OPHTHALMIC | Status: DC | PRN
  Administered 2013-11-21: 1 via OPHTHALMIC

## 2013-11-21 MED ORDER — EPHEDRINE SULFATE 50 MG/ML IJ SOLN
INTRAMUSCULAR | Status: AC
  Filled 2013-11-21: qty 1

## 2013-11-21 MED ORDER — ONDANSETRON HCL 4 MG/2ML IJ SOLN: 4.0000 mg | Freq: Once | INTRAMUSCULAR | Status: DC | PRN

## 2013-11-21 MED ORDER — ONDANSETRON HCL 4 MG/2ML IJ SOLN: 4.0000 mg | Freq: Four times a day (QID) | INTRAMUSCULAR | Status: DC | PRN

## 2013-11-21 MED ORDER — SODIUM CHLORIDE 0.9 % IJ SOLN
3.0000 mL | Freq: Two times a day (BID) | INTRAMUSCULAR | Status: DC
  Administered 2013-11-21: 3 mL via INTRAVENOUS

## 2013-11-21 MED ORDER — SODIUM CHLORIDE 0.9 % IV SOLN: 250.0000 mL | INTRAVENOUS | Status: DC

## 2013-11-21 MED ORDER — HYDROMORPHONE HCL PF 1 MG/ML IJ SOLN: 0.2500 mg | INTRAMUSCULAR | Status: DC | PRN

## 2013-11-21 MED ORDER — OXYCODONE-ACETAMINOPHEN 5-325 MG PO TABS: 1.0000 | ORAL_TABLET | ORAL | Status: DC | PRN

## 2013-11-21 MED ORDER — HYDROXYZINE HCL 25 MG PO TABS: 50.0000 mg | ORAL_TABLET | ORAL | Status: DC | PRN

## 2013-11-21 MED ORDER — NEOSTIGMINE METHYLSULFATE 1 MG/ML IJ SOLN
INTRAMUSCULAR | Status: AC
  Filled 2013-11-21: qty 10

## 2013-11-21 MED ORDER — OXYCODONE HCL 5 MG PO TABS: 5.0000 mg | ORAL_TABLET | Freq: Once | ORAL | Status: DC | PRN

## 2013-11-21 MED ORDER — PROPOFOL 10 MG/ML IV BOLUS
INTRAVENOUS | Status: AC
  Filled 2013-11-21: qty 20

## 2013-11-21 MED ORDER — HYDROCODONE-ACETAMINOPHEN 5-325 MG PO TABS: 1.0000 | ORAL_TABLET | ORAL | Status: DC | PRN

## 2013-11-21 MED ORDER — LIDOCAINE HCL (CARDIAC) 20 MG/ML IV SOLN
INTRAVENOUS | Status: DC | PRN
  Administered 2013-11-21: 100 mg via INTRAVENOUS

## 2013-11-21 MED ORDER — ONDANSETRON HCL 4 MG/2ML IJ SOLN
INTRAMUSCULAR | Status: AC
  Filled 2013-11-21: qty 2

## 2013-11-21 MED ORDER — MIDAZOLAM HCL 2 MG/2ML IJ SOLN
INTRAMUSCULAR | Status: AC
  Filled 2013-11-21: qty 2

## 2013-11-21 MED ORDER — KETOROLAC TROMETHAMINE 30 MG/ML IJ SOLN
INTRAMUSCULAR | Status: AC
  Filled 2013-11-21: qty 1

## 2013-11-21 MED ORDER — EPHEDRINE SULFATE 50 MG/ML IJ SOLN
INTRAMUSCULAR | Status: DC | PRN
  Administered 2013-11-21: 5 mg via INTRAVENOUS
  Administered 2013-11-21 (×2): 10 mg via INTRAVENOUS

## 2013-11-21 MED ORDER — DEXTROSE-NACL 5-0.45 % IV SOLN
INTRAVENOUS | Status: DC
  Administered 2013-11-21: 22:00:00 via INTRAVENOUS

## 2013-11-21 MED ORDER — GLYCOPYRROLATE 0.2 MG/ML IJ SOLN
INTRAMUSCULAR | Status: AC
  Filled 2013-11-21: qty 5

## 2013-11-21 MED ORDER — LACTATED RINGERS IV SOLN
INTRAVENOUS | Status: DC
  Administered 2013-11-21: 50 mL/h via INTRAVENOUS

## 2013-11-21 MED ORDER — VECURONIUM BROMIDE 10 MG IV SOLR
INTRAVENOUS | Status: AC
  Filled 2013-11-21: qty 10

## 2013-11-21 MED ORDER — ALUM & MAG HYDROXIDE-SIMETH 200-200-20 MG/5ML PO SUSP: 30.0000 mL | Freq: Four times a day (QID) | ORAL | Status: DC | PRN

## 2013-11-21 MED ORDER — ROCURONIUM BROMIDE 100 MG/10ML IV SOLN
INTRAVENOUS | Status: DC | PRN
  Administered 2013-11-21: 50 mg via INTRAVENOUS
  Administered 2013-11-21: 30 mg via INTRAVENOUS
  Administered 2013-11-21 (×2): 10 mg via INTRAVENOUS

## 2013-11-21 MED ORDER — SODIUM CHLORIDE 0.9 % IJ SOLN: 3.0000 mL | INTRAMUSCULAR | Status: DC | PRN

## 2013-11-21 SURGICAL SUPPLY — 74 items
ADH SKN CLS APL DERMABOND .7 (GAUZE/BANDAGES/DRESSINGS)
ADH SKN CLS LQ APL DERMABOND (GAUZE/BANDAGES/DRESSINGS) ×2
ALLOGRAFT 5X14X11 (Bone Implant) ×1 IMPLANT
ALLOGRAFT CA 6X14X11 (Bone Implant) ×2 IMPLANT
BAG DECANTER FOR FLEXI CONT (MISCELLANEOUS) ×2 IMPLANT
BIT DRILL NEURO 2X3.1 SFT TUCH (MISCELLANEOUS) ×1 IMPLANT
BLADE ULTRA TIP 2M (BLADE) ×2 IMPLANT
BRUSH SCRUB EZ PLAIN DRY (MISCELLANEOUS) ×2 IMPLANT
CANISTER SUCT 3000ML (MISCELLANEOUS) ×2 IMPLANT
CONT SPEC 4OZ CLIKSEAL STRL BL (MISCELLANEOUS) ×2 IMPLANT
COVER MAYO STAND STRL (DRAPES) ×2 IMPLANT
DECANTER SPIKE VIAL GLASS SM (MISCELLANEOUS) ×2 IMPLANT
DERMABOND ADHESIVE PROPEN (GAUZE/BANDAGES/DRESSINGS) ×2
DERMABOND ADVANCED (GAUZE/BANDAGES/DRESSINGS)
DERMABOND ADVANCED .7 DNX12 (GAUZE/BANDAGES/DRESSINGS) ×1 IMPLANT
DERMABOND ADVANCED .7 DNX6 (GAUZE/BANDAGES/DRESSINGS) IMPLANT
DRAPE LAPAROTOMY 100X72 PEDS (DRAPES) ×2 IMPLANT
DRAPE MICROSCOPE LEICA (MISCELLANEOUS) ×2 IMPLANT
DRAPE POUCH INSTRU U-SHP 10X18 (DRAPES) ×2 IMPLANT
DRAPE PROXIMA HALF (DRAPES) ×2 IMPLANT
DRILL NEURO 2X3.1 SOFT TOUCH (MISCELLANEOUS) ×2
ELECT COATED BLADE 2.86 ST (ELECTRODE) ×2 IMPLANT
ELECT REM PT RETURN 9FT ADLT (ELECTROSURGICAL) ×2
ELECTRODE REM PT RTRN 9FT ADLT (ELECTROSURGICAL) ×1 IMPLANT
GLOVE BIO SURGEON STRL SZ8 (GLOVE) ×2 IMPLANT
GLOVE BIOGEL PI IND STRL 7.0 (GLOVE) IMPLANT
GLOVE BIOGEL PI IND STRL 7.5 (GLOVE) IMPLANT
GLOVE BIOGEL PI IND STRL 8 (GLOVE) ×1 IMPLANT
GLOVE BIOGEL PI IND STRL 8.5 (GLOVE) IMPLANT
GLOVE BIOGEL PI INDICATOR 7.0 (GLOVE) ×2
GLOVE BIOGEL PI INDICATOR 7.5 (GLOVE) ×1
GLOVE BIOGEL PI INDICATOR 8 (GLOVE) ×1
GLOVE BIOGEL PI INDICATOR 8.5 (GLOVE) ×2
GLOVE ECLIPSE 7.5 STRL STRAW (GLOVE) ×4 IMPLANT
GLOVE EXAM NITRILE LRG STRL (GLOVE) IMPLANT
GLOVE EXAM NITRILE MD LF STRL (GLOVE) ×1 IMPLANT
GLOVE EXAM NITRILE XL STR (GLOVE) IMPLANT
GLOVE EXAM NITRILE XS STR PU (GLOVE) IMPLANT
GLOVE SURG SS PI 7.0 STRL IVOR (GLOVE) ×4 IMPLANT
GOWN BRE IMP SLV AUR LG STRL (GOWN DISPOSABLE) IMPLANT
GOWN BRE IMP SLV AUR XL STRL (GOWN DISPOSABLE) IMPLANT
GOWN STRL REIN 2XL LVL4 (GOWN DISPOSABLE) IMPLANT
GOWN STRL REUS W/ TWL LRG LVL3 (GOWN DISPOSABLE) IMPLANT
GOWN STRL REUS W/ TWL XL LVL3 (GOWN DISPOSABLE) IMPLANT
GOWN STRL REUS W/TWL LRG LVL3 (GOWN DISPOSABLE) ×6
GOWN STRL REUS W/TWL XL LVL3 (GOWN DISPOSABLE) ×8
HEAD HALTER (SOFTGOODS) ×2 IMPLANT
HEMOSTAT POWDER SURGIFOAM 1G (HEMOSTASIS) ×1 IMPLANT
KIT BASIN OR (CUSTOM PROCEDURE TRAY) ×2 IMPLANT
KIT ROOM TURNOVER OR (KITS) ×2 IMPLANT
NDL HYPO 25X1 1.5 SAFETY (NEEDLE) ×1 IMPLANT
NDL SPNL 22GX3.5 QUINCKE BK (NEEDLE) ×1 IMPLANT
NEEDLE HYPO 25X1 1.5 SAFETY (NEEDLE) ×2 IMPLANT
NEEDLE SPNL 22GX3.5 QUINCKE BK (NEEDLE) ×4 IMPLANT
NS IRRIG 1000ML POUR BTL (IV SOLUTION) ×2 IMPLANT
PACK LAMINECTOMY NEURO (CUSTOM PROCEDURE TRAY) ×2 IMPLANT
PAD ARMBOARD 7.5X6 YLW CONV (MISCELLANEOUS) ×5 IMPLANT
PLATE CERV 49MM 3LV (Plate) ×1 IMPLANT
RUBBERBAND STERILE (MISCELLANEOUS) ×4 IMPLANT
SCREW FIX 4.0X13MM (Screw) ×2 IMPLANT
SCREW FIX 4.0X15MM (Screw) ×1 IMPLANT
SCREW VAR 4.0X15MM (Screw) ×3 IMPLANT
SPONGE INTESTINAL PEANUT (DISPOSABLE) ×2 IMPLANT
SPONGE SURGIFOAM ABS GEL 100 (HEMOSTASIS) ×2 IMPLANT
STAPLER SKIN PROX WIDE 3.9 (STAPLE) ×1 IMPLANT
SUT VIC AB 0 CT1 18XCR BRD8 (SUTURE) IMPLANT
SUT VIC AB 0 CT1 8-18 (SUTURE)
SUT VIC AB 2-0 CP2 18 (SUTURE) ×2 IMPLANT
SUT VIC AB 3-0 SH 8-18 (SUTURE) ×3 IMPLANT
SYR 20ML ECCENTRIC (SYRINGE) ×2 IMPLANT
TOWEL OR 17X24 6PK STRL BLUE (TOWEL DISPOSABLE) IMPLANT
TOWEL OR 17X26 10 PK STRL BLUE (TOWEL DISPOSABLE) ×2 IMPLANT
TRAY FOLEY CATH 16FRSI W/METER (SET/KITS/TRAYS/PACK) ×1 IMPLANT
WATER STERILE IRR 1000ML POUR (IV SOLUTION) ×2 IMPLANT

## 2013-11-21 NOTE — H&P (Signed)
Subjective: Patient is a 73 y.o. male who is admitted for treatment of bilateral cervical radiculopathy secondary to multilevel cervical disc herniations, superimposed upon cervical spondylosis and degenerative disc disease. Patient presented initially with right cervical radicular pain, numbness, and tingling. However he has developed symptoms to the left side as well. Patient was treated with anti-inflammatory medications without relief as well as chiropractic treatment. He is admitted now for 3 level C4-5 C5-6 and C6-7 anterior cervical decompression and arthrodesis with allograft and cervical plating.   Patient Active Problem List   Diagnosis Date Noted  . Atrial fibrillation 08/12/2013  . Murmur 02/05/2013  . Postop check 02/07/2012  . Cyst of skin and subcutaneous tissue 01/30/2012  . Atrial fibrillation with rapid ventricular response 01/09/2011  . Obstructive sleep apnea 01/09/2011  . Hypertension 01/09/2011  . Ulcerative colitis 01/09/2011  . Hyperlipidemia 01/09/2011   Past Medical History  Diagnosis Date  . Hypertension   . Colitis   . Arrhythmia   . Arthritis   . Prostate cancer     in remission  . Heart murmur   . Family history of anesthesia complication     mother post- op [place]  . Paroxysmal atrial fibrillation     history of  . Sleep apnea     uses CPAP  . History of kidney stones   . Umbilical hernia     Past Surgical History  Procedure Laterality Date  . Back surgery    . Prostatectomy    . Nasal sinus surgery    . Leg surgery      right    Prescriptions prior to admission  Medication Sig Dispense Refill  . irbesartan (AVAPRO) 300 MG tablet Take 300 mg by mouth daily.       . metoprolol tartrate (LOPRESSOR) 25 MG tablet Take 25 mg by mouth 2 (two) times daily.      Marland Kitchen sulfaSALAzine (AZULFIDINE) 500 MG tablet Take 2,000 mg by mouth 2 (two) times daily.       Marland Kitchen aspirin EC 81 MG tablet Take 1 tablet (81 mg total) by mouth daily.       No Known Allergies   History  Substance Use Topics  . Smoking status: Former Smoker -- 0.50 packs/day for 15 years    Types: Cigarettes    Quit date: 10/10/1969  . Smokeless tobacco: Former Systems developer    Types: Chew  . Alcohol Use: No    Family History  Problem Relation Age of Onset  . Parkinsonism Father 43    deceased  . Dementia Mother 28    deceased  . Hypertension Brother 86    alive  . Hypertension Sister 12    alive  . Hypothyroidism Sister      Review of Systems A comprehensive review of systems was negative.  Objective: Vital signs in last 24 hours: Temp:  [98.3 F (36.8 C)] 98.3 F (36.8 C) (02/12 0806) Pulse Rate:  [55] 55 (02/12 0806) Resp:  [20] 20 (02/12 0806) BP: (150)/(79) 150/79 mmHg (02/12 0806) SpO2:  [98 %] 98 % (02/12 0806)  EXAM: Patient well-developed well-nourished white male in no acute distress. Lungs are clear to auscultation , the patient has symmetrical respiratory excursion. Heart has a regular rate and rhythm normal S1 and S2 no murmur.   Abdomen is soft nontender nondistended bowel sounds are present. Extremity examination shows no clubbing cyanosis or edema. Motor examination shows 5 over 5 strength in the upper extremities including the deltoid biceps triceps  and intrinsics and grip. Sensation is intact to pinprick throughout the digits of the upper extremities. Reflexes are symmetrical and without evidence of pathologic reflexes. Patient has a normal gait and stance.   Data Review:CBC    Component Value Date/Time   WBC 4.0 11/14/2013 0938   RBC 4.48 11/14/2013 0938   HGB 13.2 11/14/2013 0938   HCT 40.1 11/14/2013 0938   PLT 247 11/14/2013 0938   MCV 89.5 11/14/2013 0938   MCH 29.5 11/14/2013 0938   MCHC 32.9 11/14/2013 0938   RDW 14.2 11/14/2013 0938   LYMPHSABS 1.4 02/05/2013 1005   MONOABS 0.7 02/05/2013 1005   EOSABS 0.1 02/05/2013 1005   BASOSABS 0.1 02/05/2013 1005                          BMET    Component Value Date/Time   NA 141 11/14/2013 0938   K 4.7 11/14/2013  0938   CL 105 11/14/2013 0938   CO2 25 11/14/2013 0938   GLUCOSE 101* 11/14/2013 0938   BUN 14 11/14/2013 0938   CREATININE 0.69 11/14/2013 0938   CALCIUM 9.2 11/14/2013 0938   GFRNONAA >90 11/14/2013 0938   GFRAA >90 11/14/2013 1660     Assessment/Plan: Patient with out of cervical radiculopathy, right worse than left 100 the level cervical disc herniations, superimposed upon cervical spondylosis and degenerative disease. Is admitted now for a C4-5, C5-6, and C6-7 ACDF.  I've discussed with the patient the nature of his condition, the nature the surgical procedure, the typical length of surgery, hospital stay, and overall recuperation. We discussed limitations postoperatively. I discussed risks of surgery including risks of infection, bleeding, possibly need for transfusion, the risk of nerve root dysfunction with pain, weakness, numbness, or paresthesias, the risk of spinal cord dysfunction with paralysis of all 4 limbs and quadriplegia, and the risk of dural tear and CSF leakage and possible need for further surgery, the risk of esophageal dysfunction causing dysphagia and the risk of laryngeal dysfunction causing hoarseness of the voice, the risk of failure of the arthrodesis and the possible need for further surgery, and the risk of anesthetic complications including myocardial infarction, stroke, pneumonia, and death. We also discussed the need for postoperative immobilization in a cervical collar. Understanding all this the patient does wish to proceed with surgery and is admitted for such.    Hosie Spangle, MD 11/21/2013 9:54 AM

## 2013-11-21 NOTE — Progress Notes (Signed)
Filed Vitals:   11/21/13 1528 11/21/13 1543 11/21/13 1545 11/21/13 1600  BP: 126/95 147/82  133/73  Pulse: 66 71  69  Temp:   97.5 F (36.4 C) 97.5 F (36.4 C)  TempSrc:      Resp: 8 15  18   SpO2: 98% 99%  95%    Patient resting in bed. Has not yet voided. Has not yet ambulated. Wound clean and dry.  Plan: Encouraged to progress, including ambulation in the halls.  Hosie Spangle, MD 11/21/2013, 4:34 PM

## 2013-11-21 NOTE — Anesthesia Postprocedure Evaluation (Signed)
Anesthesia Post Note  Patient: Daniel Dalton.  Procedure(s) Performed: Procedure(s) (LRB): Cervical four-five, Cervical five-six, Cervical six-seven anterior cervical decompression with fusion plating and bonegraft (N/A)  Anesthesia type: general  Patient location: PACU  Post pain: Pain level controlled  Post assessment: Patient's Cardiovascular Status Stable  Last Vitals:  Filed Vitals:   11/21/13 1345  BP: 119/68  Pulse: 77  Temp:   Resp: 28    Post vital signs: Reviewed and stable  Level of consciousness: sedated  Complications: No apparent anesthesia complications

## 2013-11-21 NOTE — Preoperative (Signed)
Beta Blockers 2  Reason not to administer Beta Blockers:metoprolol 2.12.15

## 2013-11-21 NOTE — Progress Notes (Signed)
Utilization review completed.  

## 2013-11-21 NOTE — Op Note (Signed)
11/21/2013  1:45 PM  PATIENT:  Daniel Dalton.  73 y.o. male  PRE-OPERATIVE DIAGNOSIS:  cervical herniated disc cervical degenerative disc disease cervical spondylosis  POST-OPERATIVE DIAGNOSIS:  cervical herniated disc cervical degenerative disc disease cervical spondylosis  PROCEDURE:  Procedure(s): Cervical four-five, Cervical five-six, Cervical six-seven anterior cervical decompression with allograft and tether cervical plating  SURGEON:  Surgeon(s): Hosie Spangle, MD Erline Levine, MD  ASSISTANTS: Erline Levine, M.D.  ANESTHESIA:   general  EBL:  Total I/O In: 1600 [I.V.:1600] Out: 400 [Urine:250; Blood:150]  BLOOD ADMINISTERED:none  COUNT: Correct per nursing staff  DICTATION: Patient was brought to the operating room placed under general endotracheal anesthesia. Patient was placed in 10 pounds of halter traction. The neck was prepped with Betadine soap and solution and draped in a sterile fashion. A obliquel incision was made on the left side of the neck paralleling the anterior border of the sternocleidomastoid.. The line of the incision was infiltrated with local anesthetic with epinephrine. Dissection was carried down thru the subcutaneous tissue and platysma, bipolar cautery was used to maintain hemostasis. Dissection was then carried out thru an avascular plane leaving the sternocleidomastoid carotid artery and jugular vein laterally and the trachea and esophagus medially. The ventral aspect of the vertebral column was identified and a localizing x-ray was taken. The C4-5, C5-6, and C6-7 levels were identified. The annulus at each level was incised and the disc space entered. Discectomy was performed with micro-curettes and pituitary rongeurs. The operating microscope was draped and brought into the field provided additional magnification illumination and visualization. Discectomy was continued posteriorly thru the disc space and then the cartilaginous endplate was  removed using micro-curettes along with the high-speed drill. Posterior osteophytic overgrowth was removed at each level using the high-speed drill along with a 2 mm thin footplated Kerrison punch. Posterior longitudinal ligament along with disc herniation was carefully removed, decompressing the spinal canal and thecal sac. We then continued to remove osteophytic overgrowth and disc material decompressing the neural foramina and exiting nerve roots bilaterally. Once the decompression was completed hemostasis was established at each level with the use of Gelfoam with thrombin and bipolar cautery. The Gelfoam was removed the wound irrigated and hemostasis confirmed. We then measured the height of each intravertebral disc space level and selected a 6 millimeter in height structural allograft for the C4-5 level, a 6 millimeter in height structural allograft for the C5-6 level, and a 5 millimeter in height structural allograft for the C6-7 level . Each was hydrated in saline solution and then gently positioned in the intravertebral disc space and countersunk. We then selected a 49 millimeter in height Tether cervical plate. It was positioned over the fusion construct and secured to the vertebra with a pair of  X 13 mm fixed screws at C4, a 4 x 15 mm variable screw at C5, a 4 x 15 mm fixed screw C6, and a pair of 4 x 15 mm variable screws at C7. Each screw hole was started with the high-speed drill and then the screws placed, once all the screws were placed final tightening was performed. The wound was irrigated with bacitracin solution checked for hemostasis which was established and confirmed. An x-ray was taken which showed the graft in good position, the plate and screws in good position, and the overall alignment to be good. We then proceeded with closure. The platysma was closed with interrupted inverted 2-0 undyed Vicryl suture, the subcutaneous and subcuticular closed with interrupted inverted 3-0  undyed Vicryl  suture. The skin edges were approximated with Dermabond. Following surgery the patient was taken out of cervical traction. To be reversed and the anesthetic and taken to the recovery room for further care.  PLAN OF CARE: Admit to inpatient   PATIENT DISPOSITION:  PACU - hemodynamically stable.   Delay start of Pharmacological VTE agent (>24hrs) due to surgical blood loss or risk of bleeding:  yes

## 2013-11-21 NOTE — Anesthesia Preprocedure Evaluation (Signed)
Anesthesia Evaluation  Patient identified by MRN, date of birth, ID band Patient awake    Reviewed: Allergy & Precautions, H&P , NPO status , Patient's Chart, lab work & pertinent test results  Airway Mallampati: I TM Distance: >3 FB Neck ROM: Full    Dental   Pulmonary sleep apnea and Continuous Positive Airway Pressure Ventilation , former smoker,          Cardiovascular hypertension, Pt. on medications     Neuro/Psych    GI/Hepatic   Endo/Other    Renal/GU      Musculoskeletal   Abdominal   Peds  Hematology   Anesthesia Other Findings   Reproductive/Obstetrics                           Anesthesia Physical Anesthesia Plan  ASA: III  Anesthesia Plan: General   Post-op Pain Management:    Induction: Intravenous  Airway Management Planned: Oral ETT  Additional Equipment:   Intra-op Plan:   Post-operative Plan: Extubation in OR  Informed Consent: I have reviewed the patients History and Physical, chart, labs and discussed the procedure including the risks, benefits and alternatives for the proposed anesthesia with the patient or authorized representative who has indicated his/her understanding and acceptance.     Plan Discussed with: CRNA and Surgeon  Anesthesia Plan Comments:         Anesthesia Quick Evaluation

## 2013-11-21 NOTE — Progress Notes (Signed)
CPAP was set up at the pt.'s bedside on auto titrate (Min: 5, Max: 16) via FFM (what pt. Wears at home) Pt. Placed himself on CPAP & stated that the pressure was comfortable & felt similar to what he wore at home. Pt. Stated that he would place himself on CPAP before going to bed.

## 2013-11-21 NOTE — Anesthesia Procedure Notes (Signed)
Procedure Name: Intubation Date/Time: 11/21/2013 10:28 AM Performed by: Ollen Bowl Pre-anesthesia Checklist: Patient identified, Timeout performed, Emergency Drugs available, Suction available and Patient being monitored Patient Re-evaluated:Patient Re-evaluated prior to inductionOxygen Delivery Method: Circle system utilized and Simple face mask Preoxygenation: Pre-oxygenation with 100% oxygen Intubation Type: IV induction Ventilation: Mask ventilation without difficulty and Oral airway inserted - appropriate to patient size Laryngoscope Size: Miller and 3 Grade View: Grade III Tube type: Oral Tube size: 7.5 mm Number of attempts: 1 Airway Equipment and Method: Patient positioned with wedge pillow and Stylet Placement Confirmation: ETT inserted through vocal cords under direct vision,  positive ETCO2 and breath sounds checked- equal and bilateral Secured at: 21 cm Tube secured with: Tape Dental Injury: Teeth and Oropharynx as per pre-operative assessment

## 2013-11-21 NOTE — Transfer of Care (Signed)
Immediate Anesthesia Transfer of Care Note  Patient: Daniel Dalton.  Procedure(s) Performed: Procedure(s) with comments: Cervical four-five, Cervical five-six, Cervical six-seven anterior cervical decompression with fusion plating and bonegraft (N/A) - Cervical four-five, Cervical five-six, Cervical six-seven anterior cervical decompression with fusion plating and bonegraft  Patient Location: PACU  Anesthesia Type:General  Level of Consciousness: awake, alert  and oriented  Airway & Oxygen Therapy: Patient Spontanous Breathing and Patient connected to nasal cannula oxygen  Post-op Assessment: Report given to PACU RN, Post -op Vital signs reviewed and stable and Patient moving all extremities X 4  Post vital signs: Reviewed and stable  Complications: No apparent anesthesia complications

## 2013-11-22 ENCOUNTER — Encounter (HOSPITAL_COMMUNITY): Payer: Self-pay | Admitting: Neurosurgery

## 2013-11-22 MED ORDER — HYDROCODONE-ACETAMINOPHEN 5-325 MG PO TABS: 1.0000 | ORAL_TABLET | ORAL | Status: DC | PRN

## 2013-11-22 NOTE — Progress Notes (Signed)
Patient discharged home with script and discharged instructions. Patient and family stated understanding of instructions given. [name] RN

## 2013-11-22 NOTE — Discharge Instructions (Signed)

## 2013-11-22 NOTE — Discharge Summary (Signed)
Physician Discharge Summary  Patient ID: Daniel Dalton. MRN: [id number] DOB/AGE: [birth year] 73 y.o.  Admit date: 11/21/2013 Discharge date: 11/22/2013  Admission Diagnoses:  cervical herniated disc cervical degenerative disc disease cervical spondylosis  Discharge Diagnoses:  cervical herniated disc cervical degenerative disc disease cervical spondylosis  Active Problems:   HNP (herniated nucleus pulposus), cervical  Discharged Condition: good  Hospital Course: Patient was admitted underwent a 3 level CIV-5 C5-6 and C6-7 ACDF. He is done well following surgery. He is up and ambulating actively in the halls. He is voiding well. His incision is healing nicely. He has been given instructions regarding wound care and activities following discharge. He is to return for followup with me in 3 weeks.  Discharge Exam: Blood pressure 137/86, pulse 76, temperature 98.7 F (37.1 C), temperature source Oral, resp. rate 18, SpO2 93.00%.  Disposition: Home   Future Appointments Provider Department Dept Phone   04/08/2014 9:00 AM Deneise Lever, MD [facility] [phone]       Medication List         aspirin EC 81 MG tablet  Take 1 tablet (81 mg total) by mouth daily.     HYDROcodone-acetaminophen 5-325 MG per tablet  Commonly known as:  NORCO/VICODIN  Take 1-2 tablets by mouth every 4 (four) hours as needed for moderate pain or severe pain.     irbesartan 300 MG tablet  Commonly known as:  AVAPRO  Take 300 mg by mouth daily.     metoprolol tartrate 25 MG tablet  Commonly known as:  LOPRESSOR  Take 25 mg by mouth 2 (two) times daily.     sulfaSALAzine 500 MG tablet  Commonly known as:  AZULFIDINE  Take 2,000 mg by mouth 2 (two) times daily.         Signed: Hosie Spangle, MD 11/22/2013, 7:44 AM

## 2013-11-22 NOTE — Progress Notes (Signed)
PT. AMBULATED IN HALL AT 2139 AND THEN TRIED TO VOID BUT WAS UNABLE TO.  BLADDER SCAN SHOWED ONLY 26 CC IN BLADDER. PT. REPORTING THAT HE HAD NOT BEEN DRINKING POSTOP BECAUSE HE IS PARTIAL TO HIS WATER FROM HIS OWN HOME AND DOES NOT LIKE SODA'S.  IV FLUIDS OF D5 I/2 NS RESTARTED AT 100 CC PER HOUR AND PT. ENCOURAGED TO DRINK H20. WILL CONTINUE TO MONITOR.         [place] RN

## 2013-12-06 ENCOUNTER — Other Ambulatory Visit: Payer: Self-pay | Admitting: Cardiology

## 2014-04-08 ENCOUNTER — Ambulatory Visit: Payer: 59 | Admitting: Internal Medicine

## 2014-06-08 ENCOUNTER — Other Ambulatory Visit: Payer: Self-pay | Admitting: Cardiology

## 2014-08-04 ENCOUNTER — Other Ambulatory Visit: Payer: Self-pay | Admitting: Cardiology

## 2014-08-12 ENCOUNTER — Encounter: Payer: Self-pay | Admitting: *Deleted

## 2014-08-13 ENCOUNTER — Ambulatory Visit (INDEPENDENT_AMBULATORY_CARE_PROVIDER_SITE_OTHER): Payer: Medicare Other | Admitting: Cardiology

## 2014-08-13 ENCOUNTER — Encounter: Payer: Self-pay | Admitting: Cardiology

## 2014-08-13 VITALS — BP 124/75 | HR 58 | Ht 70.0 in | Wt 197.0 lb

## 2014-08-13 DIAGNOSIS — I4891 Unspecified atrial fibrillation: Secondary | ICD-10-CM

## 2014-08-13 DIAGNOSIS — I159 Secondary hypertension, unspecified: Secondary | ICD-10-CM

## 2014-08-13 NOTE — Patient Instructions (Signed)
Your physician wants you to follow-up in: 1 year with Dr Aundra Dubin. (November 2016).  You will receive a reminder letter in the mail two months in advance. If you don't receive a letter, please call our office to schedule the follow-up appointment.

## 2014-08-14 NOTE — Progress Notes (Signed)
Patient ID: Daniel Dalton., male   DOB: 1941/08/02, 73 y.o.   MRN: [id number] PCP: Dr. Dagmar Hait  73 yo with history of HTN, OSA, ulcerative colitis, and paroxysmal atrial fibrillation presents for cardiology followup.  He continues to be fairly active, doing yardwork and gardening without exertional dyspnea or chest pain.  No palpitations recently to suggest atrial fibrillation. He is in NSR today.  His ulcerative colitis has been quiescent, no GI bleeding.  He had an echo in 5/14 showing EF 55-60%, moderate diastolic dysfunction, moderate TR, mild MR, mild pulmonary hypertension.  Since I last saw him, he had c-spine surgery in 6/57 with no complications.  At a prior appointment, I suggested that he start on apixaban 5 mg bid and stop ASA given paroxysmal atrial fibrillation.  This would cost about $40/month and he said that this was too much.  I offered any of the other NOACs or coumadin, but he decided that he did not want to take any of them.  He has had no stroke-like symptoms.   ECG: NSR, normal  Labs (4/14): K 4.5, creatinine 0.8 Labs (2/15): K 4.7, creatinine 0.69  PMH: 1. Hyperlipidemia 2. OSA on CPAP 3. HTN 4. Ulcerative colitis: Quiescent 5. Prostate cancer s/p prostatectomy 6. Back surgery  7. Paroxysmal atrial fibrillation: Echo (5/14) with EF 55-60%, moderate diastolic dysfunction, moderate TR, mild MR, PA systolic pressure 84-69 mmHg.  He has refused anticoagulation.   FH: HTN, parkinsons  SH: Married, lives near [place], retired from [employer], quit smoking in [place], 2 children.    Current Outpatient Prescriptions  Medication Sig Dispense Refill  . aspirin EC 81 MG tablet Take 1 tablet (81 mg total) by mouth daily.    . irbesartan (AVAPRO) 300 MG tablet Take 300 mg by mouth daily.     . metoprolol tartrate (LOPRESSOR) 25 MG tablet Take 25 mg by mouth 2 (two) times daily.    . metoprolol tartrate (LOPRESSOR) 25 MG tablet TAKE ONE TABLET BY MOUTH TWICE DAILY 60 tablet 0   . sulfaSALAzine (AZULFIDINE) 500 MG tablet Take 2,000 mg by mouth 2 (two) times daily.      No current facility-administered medications for this visit.    BP 124/75 mmHg  Pulse 58  Ht 5' 10"  (1.778 m)  Wt 197 lb (89.359 kg)  BMI 28.27 kg/m2 General: NAD Neck: No JVD, no thyromegaly or thyroid nodule.  Lungs: Clear to auscultation bilaterally with normal respiratory effort. CV: Nondisplaced PMI.  Heart regular S1/S2, no S3/S4, 1/6 HSM LLSB.  No edema.  No carotid bruit.  Normal pedal pulses.  Abdomen: Soft, nontender, no hepatosplenomegaly, no distention.  Skin: Intact without lesions or rashes.  Neurologic: Alert and oriented x 3.  Psych: Normal affect. Extremities: No clubbing or cyanosis.   Assessment/Plan: 1. Paroxysmal atrial fibrillation: He is in NSR today.  CHADSVASC score = 3 (age 29, HTN).  His ulcerative colitis has been quiscent with no bleeding x years.  I think that [name] would be a reasonable candidate for anticoagulation.  I again talked to him about anticoagulation.  He remains adamant that he does not want anticoagulation. We will continue him on ASA 81 for MI prevention and metoprolol.  2. Murmur: Moderate TR on last echo.    3. HTN: BP is controlled on current regimen.   [name] 08/14/2014

## 2014-09-05 ENCOUNTER — Other Ambulatory Visit: Payer: Self-pay | Admitting: Cardiology

## 2014-10-14 ENCOUNTER — Telehealth: Payer: Self-pay | Admitting: Internal Medicine

## 2014-10-14 NOTE — Telephone Encounter (Signed)
Spoke with pt - he is trying to obtain an dental appliance through Dr. Ron Parker. Requesting sleep study results to be faxed to Dr. Kae Heller office.  This has been done.  Pt aware and voiced no further questions or concerns at this time.  Note:  Pt hasn't been seen by Dr .Annamaria Boots in > 1 yr.  No pending appts.  Offered to schedule f/u.  Pt declined at this time.

## 2015-09-07 ENCOUNTER — Other Ambulatory Visit: Payer: Self-pay | Admitting: Cardiology

## 2015-09-09 ENCOUNTER — Other Ambulatory Visit: Payer: Self-pay

## 2015-09-09 MED ORDER — METOPROLOL TARTRATE 25 MG PO TABS: 25.0000 mg | ORAL_TABLET | Freq: Two times a day (BID) | ORAL | Status: DC

## 2015-10-22 ENCOUNTER — Telehealth: Payer: Self-pay | Admitting: Cardiology

## 2015-10-22 NOTE — Telephone Encounter (Signed)
New message       *STAT* If patient is at the pharmacy, call can be transferred to refill team.   1. Which medications need to be refilled? (please list name of each medication and dose if known) metoprolol 5m 2. Which pharmacy/location (including street and city if local pharmacy) is medication to be sent to? [facility] 3. Do they need a 30 day or 90 day supply? 90 day Pt said the pharmacy said contact the office before next refill. I told the pt that he is overdue for an office visit.  Pt said at last ov in 2015, Dr MAundra Dubintold pt he did not need to be seen for 2 years.

## 2015-10-22 NOTE — Telephone Encounter (Signed)
Called pt and informed him that he has not been seen sent 08/2014 and that he needed to make an appointment before we could fill his Rx. I informed the pt that he could either make an appointment with our practice or f/u with his PCP. Pt still has not made an appointment.

## 2016-08-29 NOTE — Progress Notes (Signed)
CARDIOLOGY OFFICE NOTE  Date:  08/30/2016    Daniel Dalton. Date of Birth: Sep 12, 1941 Medical Record [id number]  PCP:  Tivis Ringer, MD  Cardiologist:  Annabell Howells    Chief Complaint  Patient presents with  . Atrial Fibrillation    2 year check - seen for Dr. Aundra Dubin    History of Present Illness: Daniel Dalton. is a 75 y.o. male who presents today for a 2 year check. Seen for Dr. Aundra Dubin.   He has a history of HTN, OSA, ulcerative colitis, and paroxysmal atrial fibrillation.  He had an echo in 5/14 showing EF 55-60%, moderate diastolic dysfunction, moderate TR, mild Daniel, mild pulmonary hypertension.  He has declined anticoagulation on numerous visits.   Last seen 2 years ago - felt to be doing ok.   Comes in today. Here alone. He is doing well. Needs metoprolol refilled. No chest pain. Not short of breath. Rhythm has been stable. Does not wish to be on anticoagulation other than aspirin. He is very happy with how he is doing. Has actively lost weight. Labs by PCP.   PMH: 1. Hyperlipidemia 2. OSA on CPAP 3. HTN 4. Ulcerative colitis: Quiescent 5. Prostate cancer s/p prostatectomy 6. Back surgery  7. Paroxysmal atrial fibrillation: Echo (5/14) with EF 55-60%, moderate diastolic dysfunction, moderate TR, mild MR, PA systolic pressure XX123456 mmHg.  He has refused anticoagulation.   Past Medical History:  Diagnosis Date  . Arrhythmia   . Arthritis   . Colitis   . Family history of anesthesia complication    mother post- op NV  . Heart murmur   . History of kidney stones   . Hypertension   . Paroxysmal atrial fibrillation    history of  . Prostate cancer    in remission  . Sleep apnea    uses CPAP  . Umbilical hernia     Past Surgical History:  Procedure Laterality Date  . ANTERIOR CERVICAL DECOMP/DISCECTOMY FUSION N/A 11/21/2013   Procedure: Cervical four-five, Cervical five-six, Cervical six-seven anterior cervical decompression with fusion  plating and bonegraft;  Surgeon: Hosie Spangle, MD;  Location: [facility];  Service: Neurosurgery;  Laterality: N/A;  Cervical four-five, Cervical five-six, Cervical six-seven anterior cervical decompression with fusion plating and bonegraft  . BACK SURGERY    . LEG SURGERY Right   . NASAL SINUS SURGERY    . PROSTATECTOMY       Medications: Current Outpatient Prescriptions  Medication Sig Dispense Refill  . aspirin EC 81 MG tablet Take 1 tablet (81 mg total) by mouth daily.    . irbesartan (AVAPRO) 300 MG tablet Take 300 mg by mouth daily.     . metoprolol tartrate (LOPRESSOR) 25 MG tablet Take 1 tablet (25 mg total) by mouth 2 (two) times daily. 180 tablet 3  . sulfaSALAzine (AZULFIDINE) 500 MG tablet Take 2,000 mg by mouth 2 (two) times daily.      No current facility-administered medications for this visit.     Allergies: No Known Allergies  Social History: The patient  reports that he quit smoking about 46 years ago. His smoking use included Cigarettes. He has a 7.50 pack-year smoking history. He has quit using smokeless tobacco. His smokeless tobacco use included Chew. He reports that he does not drink alcohol or use drugs.   Family History: The patient's family history includes Anesthesia problems in his mother; Dementia (age of onset: 68) in his mother; Hypertension (age  of onset: 33) in his sister; Hypertension (age of onset: 66) in his brother; Hypothyroidism in his sister; Parkinsonism (age of onset: 67) in his father.   Review of Systems: Please see the history of present illness.   Otherwise, the review of systems is positive for none.   All other systems are reviewed and negative.   Physical Exam: VS:  BP 118/78   Pulse (!) 53   Ht 5\' 10"  (1.778 m)   Wt 195 lb 6.4 oz (88.6 kg)   BMI 28.04 kg/m  .  BMI Body mass index is 28.04 kg/m.  Wt Readings from Last 3 Encounters:  08/30/16 195 lb 6.4 oz (88.6 kg)  08/13/14 197 lb (89.4 kg)  11/14/13 216 lb 1.6 oz (98  kg)    General: Pleasant. Well developed, well nourished and in no acute distress.   HEENT: Normal.  Neck: Supple, no JVD, carotid bruits, or masses noted.  Cardiac: Regular rate and rhythm. No murmurs, rubs, or gallops. No edema.  Respiratory:  Lungs are clear to auscultation bilaterally with normal work of breathing.  GI: Soft and nontender.  MS: No deformity or atrophy. Gait and ROM intact.  Skin: Warm and dry. Color is normal.  Neuro:  Strength and sensation are intact and no gross focal deficits noted.  Psych: Alert, appropriate and with normal affect.   LABORATORY DATA:  EKG:  EKG is ordered today. This demonstrates sinus brady.  Lab Results  Component Value Date   WBC 4.0 11/14/2013   HGB 13.2 11/14/2013   HCT 40.1 11/14/2013   PLT 247 11/14/2013   GLUCOSE 101 (H) 11/14/2013   NA 141 11/14/2013   K 4.7 11/14/2013   CL 105 11/14/2013   CREATININE 0.69 11/14/2013   BUN 14 11/14/2013   CO2 25 11/14/2013    BNP (last 3 results) No results for input(s): BNP in the last 8760 hours.  ProBNP (last 3 results) No results for input(s): PROBNP in the last 8760 hours.   Other Studies Reviewed Today:  Echo Study Conclusions 02/2013  - Left ventricle: The cavity size was normal. Wall thickness was normal. Systolic function was normal. The estimated ejection fraction was in the range of 55% to 60%. Wall motion was normal; there were no regional wall motion abnormalities. Features are consistent with a pseudonormal left ventricular filling pattern, with concomitant abnormal relaxation and increased filling pressure (grade 2 diastolic dysfunction). - Aortic valve: There was no stenosis. - Mitral valve: Mild regurgitation. - Left atrium: The atrium was mildly dilated. - Right ventricle: The cavity size was normal. Systolic function was normal. - Right atrium: The atrium was mildly dilated. - Tricuspid valve: Moderate regurgitation. Peak RV-RA gradient:  39mm Hg (S). - Pulmonary arteries: PA systolic pressure [id number] mmHg. - Systemic veins: IVC measured 1.9 cm with normal respirophasic variation, suggesting RA pressure 6-10 mmHg. Impressions:  - Normal LV size and systolic function, EF 0000000. Moderate diastolic dysfunction. Normal RV size and systolic function. Moderate tricuspid regurgitation, mild mitral regurgitation. Mild pulmonary hypertension.   Assessment/Plan:  1. Paroxysmal atrial fibrillation: He is in NSR today.  CHADSVASC score = 3 (age 22, HTN).  His ulcerative colitis has been quiscent with no bleeding x years.  I think that Daniel Dalton would be a reasonable candidate for anticoagulation.  I again talked to him about anticoagulation.  He remains adamant that he does not want anticoagulation. We will continue him on ASA 81 for MI prevention and metoprolol.   2. Murmur:  Moderate TR on last echo.  He is doing well clinically.   3. HTN: BP is controlled on current regimen.   4. Diastolic dysfunction - has good BP control and has lost weight.   Current medicines are reviewed with the patient today.  The patient does not have concerns regarding medicines other than what has been noted above.  The following changes have been made:  See above.  Labs/ tests ordered today include:    Orders Placed This Encounter  Procedures  . EKG 12-Lead     Disposition:   FU with me in 2 years.   Patient is agreeable to this plan and will call if any problems develop in the interim.   Signed: Burtis Junes, RN, ANP-C 08/30/2016 9:10 AM  [facility] [address] [place], [place]  [address] Phone: [phone] Fax: [phone]

## 2016-08-30 ENCOUNTER — Encounter (INDEPENDENT_AMBULATORY_CARE_PROVIDER_SITE_OTHER): Payer: Self-pay

## 2016-08-30 ENCOUNTER — Encounter: Payer: Self-pay | Admitting: Nurse Practitioner

## 2016-08-30 ENCOUNTER — Telehealth: Payer: Self-pay | Admitting: Nurse Practitioner

## 2016-08-30 ENCOUNTER — Ambulatory Visit (INDEPENDENT_AMBULATORY_CARE_PROVIDER_SITE_OTHER): Payer: Medicare Other | Admitting: Nurse Practitioner

## 2016-08-30 ENCOUNTER — Other Ambulatory Visit: Payer: Self-pay | Admitting: *Deleted

## 2016-08-30 VITALS — BP 118/78 | HR 53 | Ht 70.0 in | Wt 195.4 lb

## 2016-08-30 DIAGNOSIS — I480 Paroxysmal atrial fibrillation: Secondary | ICD-10-CM

## 2016-08-30 MED ORDER — METOPROLOL TARTRATE 25 MG PO TABS: 25.0000 mg | ORAL_TABLET | Freq: Two times a day (BID) | ORAL | 3 refills | Status: DC

## 2016-08-30 MED ORDER — METOPROLOL TARTRATE 25 MG PO TABS
25.0000 mg | ORAL_TABLET | Freq: Two times a day (BID) | ORAL | 3 refills | Status: DC
Start: 2016-08-30 — End: 2017-06-22

## 2016-08-30 NOTE — Patient Instructions (Addendum)
We will be checking the following labs today - NONE   Medication Instructions:    Continue with your current medicines.   I sent in your refill for the Metoprolol today     Testing/Procedures To Be Arranged:  N/A  Follow-Up:   See me in 2 years    Other Special Instructions:   N/A    If you need a refill on your cardiac medications before your next appointment, please call your pharmacy.   Call the [place] office at [phone] if you have any questions, problems or concerns.

## 2016-08-30 NOTE — Telephone Encounter (Signed)
New Message  Pt call requesting to speak with RN about medication sent to pharmacy prescribed today in ov. Pt states the prescription should be sent to [facility] in [place]. Please call back to discuss if needed.

## 2016-08-30 NOTE — Telephone Encounter (Signed)
Please advise 

## 2016-08-30 NOTE — Telephone Encounter (Signed)
Sent in medications to pt's requested pharmacy, [facility] in [place].

## 2016-12-24 ENCOUNTER — Inpatient Hospital Stay (HOSPITAL_COMMUNITY)
Admission: EM | Admit: 2016-12-24 | Discharge: 2016-12-26 | DRG: 392 | Disposition: A | Discharge status: Home / Self Care | Payer: Medicare Other | Attending: General Surgery | Admitting: General Surgery

## 2016-12-24 ENCOUNTER — Emergency Department (HOSPITAL_COMMUNITY): Payer: Medicare Other

## 2016-12-24 ENCOUNTER — Encounter (HOSPITAL_COMMUNITY): Payer: Self-pay

## 2016-12-24 DIAGNOSIS — Z87891 Personal history of nicotine dependence: Secondary | ICD-10-CM

## 2016-12-24 DIAGNOSIS — Z8249 Family history of ischemic heart disease and other diseases of the circulatory system: Secondary | ICD-10-CM

## 2016-12-24 DIAGNOSIS — R109 Unspecified abdominal pain: Secondary | ICD-10-CM

## 2016-12-24 DIAGNOSIS — Z888 Allergy status to other drugs, medicaments and biological substances status: Secondary | ICD-10-CM

## 2016-12-24 DIAGNOSIS — Z7982 Long term (current) use of aspirin: Secondary | ICD-10-CM

## 2016-12-24 DIAGNOSIS — E785 Hyperlipidemia, unspecified: Secondary | ICD-10-CM | POA: Diagnosis present

## 2016-12-24 DIAGNOSIS — Z8546 Personal history of malignant neoplasm of prostate: Secondary | ICD-10-CM

## 2016-12-24 DIAGNOSIS — Z981 Arthrodesis status: Secondary | ICD-10-CM

## 2016-12-24 DIAGNOSIS — G4730 Sleep apnea, unspecified: Secondary | ICD-10-CM | POA: Diagnosis present

## 2016-12-24 DIAGNOSIS — Z87442 Personal history of urinary calculi: Secondary | ICD-10-CM

## 2016-12-24 DIAGNOSIS — I480 Paroxysmal atrial fibrillation: Secondary | ICD-10-CM | POA: Diagnosis present

## 2016-12-24 DIAGNOSIS — K440 Diaphragmatic hernia with obstruction, without gangrene: Principal | ICD-10-CM | POA: Diagnosis present

## 2016-12-24 DIAGNOSIS — Z820 Family history of epilepsy and other diseases of the nervous system: Secondary | ICD-10-CM

## 2016-12-24 DIAGNOSIS — I10 Essential (primary) hypertension: Secondary | ICD-10-CM | POA: Diagnosis present

## 2016-12-24 DIAGNOSIS — K439 Ventral hernia without obstruction or gangrene: Secondary | ICD-10-CM | POA: Diagnosis present

## 2016-12-24 DIAGNOSIS — G4733 Obstructive sleep apnea (adult) (pediatric): Secondary | ICD-10-CM | POA: Diagnosis present

## 2016-12-24 DIAGNOSIS — K449 Diaphragmatic hernia without obstruction or gangrene: Secondary | ICD-10-CM

## 2016-12-24 LAB — COMPREHENSIVE METABOLIC PANEL
ALBUMIN: 4.5 g/dL (ref 3.5–5.0)
ALK PHOS: 60 U/L (ref 38–126)
ALT: 18 U/L (ref 17–63)
AST: 26 U/L (ref 15–41)
Anion gap: 11 (ref 5–15)
BUN: 17 mg/dL (ref 6–20)
CALCIUM: 9 mg/dL (ref 8.9–10.3)
CO2: 23 mmol/L (ref 22–32)
CREATININE: 0.81 mg/dL (ref 0.61–1.24)
Chloride: 100 mmol/L — ABNORMAL LOW (ref 101–111)
GFR calc Af Amer: >60 mL/min (ref >60)
Glucose, Bld: 123 mg/dL — ABNORMAL HIGH (ref 65–99)
Potassium: 3.8 mmol/L (ref 3.5–5.1)
Sodium: 134 mmol/L — ABNORMAL LOW (ref 135–145)
Total Bilirubin: 0.6 mg/dL (ref 0.3–1.2)
Total Protein: 7.6 g/dL (ref 6.5–8.1)

## 2016-12-24 LAB — CBC
HEMATOCRIT: 39.4 % (ref 39.0–52.0)
Hemoglobin: 12.9 g/dL — ABNORMAL LOW (ref 13.0–17.0)
MCH: 29.4 pg (ref 26.0–34.0)
MCHC: 32.7 g/dL (ref 30.0–36.0)
MCV: 89.7 fL (ref 78.0–100.0)
PLATELETS: 239 10*3/uL (ref 150–400)
RBC: 4.39 MIL/uL (ref 4.22–5.81)
RDW: 14.4 % (ref 11.5–15.5)
WBC: 7.9 10*3/uL (ref 4.0–10.5)

## 2016-12-24 LAB — URINALYSIS, ROUTINE W REFLEX MICROSCOPIC
Bacteria, UA: NONE SEEN
Bilirubin Urine: NEGATIVE
Glucose, UA: NEGATIVE mg/dL
HGB URINE DIPSTICK: NEGATIVE
Ketones, ur: 5 mg/dL — AB
Leukocytes, UA: NEGATIVE
NITRITE: NEGATIVE
PH: 5 (ref 5.0–8.0)
Protein, ur: 100 mg/dL — AB
SPECIFIC GRAVITY, URINE: 1.027 (ref 1.005–1.030)
SQUAMOUS EPITHELIAL / LPF: NONE SEEN

## 2016-12-24 LAB — LIPASE, BLOOD: Lipase: 58 U/L — ABNORMAL HIGH (ref 11–51)

## 2016-12-24 LAB — I-STAT CG4 LACTIC ACID, ED: Lactic Acid, Venous: 1.36 mmol/L (ref 0.5–1.9)

## 2016-12-24 MED ORDER — MORPHINE SULFATE (PF) 4 MG/ML IV SOLN
6.0000 mg | Freq: Once | INTRAVENOUS | Status: AC
Start: 2016-12-24 — End: 2016-12-24
  Administered 2016-12-24: 6 mg via INTRAVENOUS
  Filled 2016-12-24: qty 2

## 2016-12-24 MED ORDER — HYDROMORPHONE HCL 1 MG/ML IJ SOLN
1.0000 mg | Freq: Once | INTRAMUSCULAR | Status: AC
  Administered 2016-12-24: 1 mg via INTRAVENOUS
  Filled 2016-12-24: qty 1

## 2016-12-24 MED ORDER — SODIUM CHLORIDE 0.9 % IV SOLN
INTRAVENOUS | Status: DC
  Administered 2016-12-24 – 2016-12-26 (×5): via INTRAVENOUS

## 2016-12-24 MED ORDER — SODIUM CHLORIDE 0.9 % IV BOLUS (SEPSIS)
500.0000 mL | Freq: Once | INTRAVENOUS | Status: AC
  Administered 2016-12-24: 500 mL via INTRAVENOUS

## 2016-12-24 MED ORDER — ONDANSETRON HCL 4 MG/2ML IJ SOLN
4.0000 mg | Freq: Once | INTRAMUSCULAR | Status: AC
  Administered 2016-12-24: 4 mg via INTRAVENOUS
  Filled 2016-12-24: qty 2

## 2016-12-24 MED ORDER — FENTANYL CITRATE (PF) 100 MCG/2ML IJ SOLN
50.0000 ug | INTRAMUSCULAR | Status: DC | PRN
  Administered 2016-12-24: 50 ug via INTRAVENOUS
  Filled 2016-12-24: qty 2

## 2016-12-24 MED ORDER — IOPAMIDOL (ISOVUE-370) INJECTION 76%
INTRAVENOUS | Status: AC
  Administered 2016-12-24: 100 mL
  Filled 2016-12-24: qty 100

## 2016-12-24 NOTE — ED Provider Notes (Addendum)
[facility] DEPT Provider Note   CSN: [id number] Arrival date & time: 12/24/16  2056     History   Chief Complaint Chief Complaint  Patient presents with  . Abdominal Pain    HPI Daniel Dalton. is a 76 y.o. male.  Patient presents with severe abdominal pain that started proximal me 4 hours prior to our discussion. Different than previous. Patient's had a milder form that was very transient the past however this is more significant in severity and lasting longer. Patient's had mild nausea and vomiting on arrival and has had bowel movements. No bleeding. Patient denies bowel obstruction history he has had a small hernia in the past. This did start after eating. Nonradiating except he does feel it's moving toward both groin regions.      Past Medical History:  Diagnosis Date  . Arrhythmia   . Arthritis   . Colitis   . Family history of anesthesia complication    mother post- op NV  . Heart murmur   . History of kidney stones   . Hypertension   . Paroxysmal atrial fibrillation (HCC)    history of  . Prostate cancer ([place])    in remission  . Sleep apnea    uses CPAP  . Umbilical hernia     Patient Active Problem List   Diagnosis Date Noted  . Hiatal hernia without gangrene and obstruction 12/25/2016  . HNP (herniated nucleus pulposus), cervical 11/21/2013  . Atrial fibrillation ([place]) 08/12/2013  . Murmur 02/05/2013  . Postop check 02/07/2012  . Sebaceous cyst 01/30/2012  . Atrial fibrillation with rapid ventricular response ([place]) 01/09/2011  . Obstructive sleep apnea 01/09/2011  . Hypertension 01/09/2011  . Ulcerative colitis ([place]) 01/09/2011  . Hyperlipidemia 01/09/2011    Past Surgical History:  Procedure Laterality Date  . ANTERIOR CERVICAL DECOMP/DISCECTOMY FUSION N/A 11/21/2013   Procedure: Cervical four-five, Cervical five-six, Cervical six-seven anterior cervical decompression with fusion plating and bonegraft;  Surgeon: Hosie Spangle, MD;   Location: [facility];  Service: Neurosurgery;  Laterality: N/A;  Cervical four-five, Cervical five-six, Cervical six-seven anterior cervical decompression with fusion plating and bonegraft  . BACK SURGERY    . LEG SURGERY Right   . NASAL SINUS SURGERY    . PROSTATECTOMY         Home Medications    Prior to Admission medications   Medication Sig Start Date End Date Taking? Authorizing Provider  aspirin EC 81 MG tablet Take 1 tablet (81 mg total) by mouth daily. 02/13/13  Yes Larey Dresser, MD  irbesartan (AVAPRO) 300 MG tablet Take 300 mg by mouth daily.  09/11/12  Yes Historical Provider, MD  metoprolol tartrate (LOPRESSOR) 25 MG tablet Take 1 tablet (25 mg total) by mouth 2 (two) times daily. 08/30/16  Yes Burtis Junes, NP  sulfaSALAzine (AZULFIDINE) 500 MG tablet Take 2,000 mg by mouth 2 (two) times daily.    Yes Historical Provider, MD    Family History Family History  Problem Relation Age of Onset  . Parkinsonism Father 37    deceased  . Dementia Mother 83    deceased  . Anesthesia problems Mother   . Hypertension Brother 54    alive  . Hypertension Sister 18    alive  . Hypothyroidism Sister     Social History Social History  Substance Use Topics  . Smoking status: Former Smoker    Packs/day: 0.50    Years: 15.00    Types: Cigarettes  Quit date: 10/10/1969  . Smokeless tobacco: Former Systems [employer]    Types: Chew  . Alcohol use No     Allergies   Fluorescein   Review of Systems Review of Systems  Constitutional: Negative for chills and fever.  HENT: Negative for congestion.   Eyes: Negative for visual disturbance.  Respiratory: Negative for shortness of breath.   Cardiovascular: Negative for chest pain.  Gastrointestinal: Positive for abdominal pain, nausea and vomiting.  Genitourinary: Negative for dysuria and flank pain.  Musculoskeletal: Negative for back pain, neck pain and neck stiffness.  Skin: Negative for rash.  Neurological: Negative for  light-headedness and headaches.     Physical Exam Updated Vital Signs BP (!) 146/70 (BP Location: Left Arm)   Pulse 64   Temp 98.1 F (36.7 C) (Oral)   Resp 19   Ht 5\' 10"  (1.778 m)   Wt 198 lb 4.8 oz (89.9 kg)   SpO2 97%   BMI 28.45 kg/m   Physical Exam  Constitutional: He is oriented to person, place, and time. He appears well-developed and well-nourished.  HENT:  Head: Normocephalic and atraumatic.  Eyes: Conjunctivae are normal. Right eye exhibits no discharge. Left eye exhibits no discharge.  Neck: Normal range of motion. Neck supple. No tracheal deviation present.  Cardiovascular: Regular rhythm.   Pulmonary/Chest: Effort normal and breath sounds normal.  Abdominal: Soft. He exhibits no distension. There is tenderness. There is guarding (large hernia palpated, tender to palpation, unable to reduce/ tolerate pain).  Musculoskeletal: He exhibits no edema.  Neurological: He is alert and oriented to person, place, and time.  Skin: Skin is warm. No rash noted.  Psychiatric: He has a normal mood and affect.  Nursing note and vitals reviewed.    ED Treatments / Results  Labs (all labs ordered are listed, but only abnormal results are displayed) Labs Reviewed  LIPASE, BLOOD - Abnormal; Notable for the following:       Result Value   Lipase 58 (*)    All other components within normal limits  COMPREHENSIVE METABOLIC PANEL - Abnormal; Notable for the following:    Sodium 134 (*)    Chloride 100 (*)    Glucose, Bld 123 (*)    All other components within normal limits  CBC - Abnormal; Notable for the following:    Hemoglobin 12.9 (*)    All other components within normal limits  URINALYSIS, ROUTINE W REFLEX MICROSCOPIC - Abnormal; Notable for the following:    Color, Urine AMBER (*)    Ketones, ur 5 (*)    Protein, ur 100 (*)    All other components within normal limits  BASIC METABOLIC PANEL - Abnormal; Notable for the following:    Glucose, Bld 170 (*)    All  other components within normal limits  CBC - Abnormal; Notable for the following:    Hemoglobin 12.5 (*)    All other components within normal limits  I-STAT CG4 LACTIC ACID, ED    EKG  EKG Interpretation  Date/Time:  Saturday December 24 2016 22:25:44 EDT Ventricular Rate:  87 PR Interval:    QRS Duration: 111 QT Interval:  377 QTC Calculation: 454 R Axis:   26 Text Interpretation:  Sinus rhythm Borderline prolonged PR interval RSR' in V1 or V2, right VCD or RVH Confirmed by LONG MD, Brentton Wardlow ([id number]) on 12/25/2016 3:11:41 PM       Radiology Procedures Procedures (including critical care time)  Medications Ordered in ED Medications  0.9 %  sodium chloride infusion ( Intravenous Rate/Dose Verify 12/25/16 2300)  irbesartan (AVAPRO) tablet 300 mg (300 mg Oral Given 12/25/16 1752)  sulfaSALAzine (AZULFIDINE) tablet 2,000 mg (2,000 mg Oral Given 12/25/16 2121)  enoxaparin (LOVENOX) injection 40 mg (40 mg Subcutaneous Not Given 12/25/16 2121)  ondansetron (ZOFRAN-ODT) disintegrating tablet 4 mg ( Oral See Alternative 12/25/16 0743)    Or  ondansetron (ZOFRAN) injection 4 mg (4 mg Intravenous Given 12/25/16 0743)  pantoprazole (PROTONIX) injection 40 mg (40 mg Intravenous Given 12/25/16 2121)  HYDROmorphone (DILAUDID) injection 1-2 mg (2 mg Intravenous Given 12/25/16 0757)  metoprolol (LOPRESSOR) injection 5 mg (5 mg Intravenous Given 12/25/16 2312)  MEDLINE mouth rinse (15 mLs Mouth Rinse Given 12/25/16 2122)  ondansetron (ZOFRAN) injection 4 mg (4 mg Intravenous Given 12/24/16 2217)  sodium chloride 0.9 % bolus 500 mL (0 mLs Intravenous Stopped 12/24/16 2254)  iopamidol (ISOVUE-370) 76 % injection (100 mLs  Contrast Given 12/24/16 2256)  morphine 4 MG/ML injection 6 mg (6 mg Intravenous Given 12/24/16 2258)  HYDROmorphone (DILAUDID) injection 1 mg (1 mg Intravenous Given 12/24/16 2330)  HYDROmorphone (DILAUDID) injection 0.5 mg (0.5 mg Intravenous Given 12/25/16 0119)  HYDROmorphone (DILAUDID)  injection 0.5 mg (0.5 mg Intravenous Given 12/25/16 0219)     Initial Impression / Assessment and Plan / ED Course  I have reviewed the triage vital signs and the nursing notes.  Pertinent labs & imaging results that were available during my care of the patient were reviewed by me and considered in my medical decision making (see chart for details).    Patient presented with severe abdominal pain out of proportion to exam. CT angiogram of chest abdomen pelvis ordered immediately. Radiology called to assist with breathing patient back sooner. Repeat pain meds needed.      Final Clinical Impressions(s) / ED Diagnoses   Final diagnoses:  Abdominal pain, acute    New Prescriptions Current Discharge Medication List       Elnora Morrison, MD 12/26/16 0010    Elnora Morrison, MD 01/12/17 [phone]

## 2016-12-24 NOTE — ED Triage Notes (Signed)
Pt states stat about three hours ago he started having abd pain that has gotten worse, some nausea no vomiting. Denies diarrhea.

## 2016-12-24 NOTE — ED Notes (Signed)
Patient transported to CT 

## 2016-12-25 DIAGNOSIS — I10 Essential (primary) hypertension: Secondary | ICD-10-CM | POA: Diagnosis present

## 2016-12-25 DIAGNOSIS — I480 Paroxysmal atrial fibrillation: Secondary | ICD-10-CM | POA: Diagnosis present

## 2016-12-25 DIAGNOSIS — R109 Unspecified abdominal pain: Secondary | ICD-10-CM | POA: Diagnosis present

## 2016-12-25 DIAGNOSIS — E785 Hyperlipidemia, unspecified: Secondary | ICD-10-CM | POA: Diagnosis present

## 2016-12-25 DIAGNOSIS — Z820 Family history of epilepsy and other diseases of the nervous system: Secondary | ICD-10-CM | POA: Diagnosis not present

## 2016-12-25 DIAGNOSIS — G4730 Sleep apnea, unspecified: Secondary | ICD-10-CM | POA: Diagnosis present

## 2016-12-25 DIAGNOSIS — Z87891 Personal history of nicotine dependence: Secondary | ICD-10-CM | POA: Diagnosis not present

## 2016-12-25 DIAGNOSIS — Z7982 Long term (current) use of aspirin: Secondary | ICD-10-CM | POA: Diagnosis not present

## 2016-12-25 DIAGNOSIS — Z8546 Personal history of malignant neoplasm of prostate: Secondary | ICD-10-CM | POA: Diagnosis not present

## 2016-12-25 DIAGNOSIS — Z8249 Family history of ischemic heart disease and other diseases of the circulatory system: Secondary | ICD-10-CM | POA: Diagnosis not present

## 2016-12-25 DIAGNOSIS — Z87442 Personal history of urinary calculi: Secondary | ICD-10-CM | POA: Diagnosis not present

## 2016-12-25 DIAGNOSIS — K449 Diaphragmatic hernia without obstruction or gangrene: Secondary | ICD-10-CM

## 2016-12-25 DIAGNOSIS — Z981 Arthrodesis status: Secondary | ICD-10-CM | POA: Diagnosis not present

## 2016-12-25 DIAGNOSIS — G4733 Obstructive sleep apnea (adult) (pediatric): Secondary | ICD-10-CM | POA: Diagnosis present

## 2016-12-25 DIAGNOSIS — K440 Diaphragmatic hernia with obstruction, without gangrene: Secondary | ICD-10-CM | POA: Diagnosis present

## 2016-12-25 DIAGNOSIS — K439 Ventral hernia without obstruction or gangrene: Secondary | ICD-10-CM | POA: Diagnosis present

## 2016-12-25 DIAGNOSIS — Z888 Allergy status to other drugs, medicaments and biological substances status: Secondary | ICD-10-CM | POA: Diagnosis not present

## 2016-12-25 LAB — BASIC METABOLIC PANEL
Anion gap: 10 (ref 5–15)
BUN: 15 mg/dL (ref 6–20)
CALCIUM: 9 mg/dL (ref 8.9–10.3)
CHLORIDE: 106 mmol/L (ref 101–111)
CO2: 24 mmol/L (ref 22–32)
Creatinine, Ser: 0.82 mg/dL (ref 0.61–1.24)
GFR calc Af Amer: >60 mL/min (ref >60)
GLUCOSE: 170 mg/dL — AB (ref 65–99)
POTASSIUM: 4.3 mmol/L (ref 3.5–5.1)
Sodium: 140 mmol/L (ref 135–145)

## 2016-12-25 LAB — CBC
HCT: 39.6 % (ref 39.0–52.0)
Hemoglobin: 12.5 g/dL — ABNORMAL LOW (ref 13.0–17.0)
MCH: 28.8 pg (ref 26.0–34.0)
MCHC: 31.6 g/dL (ref 30.0–36.0)
MCV: 91.2 fL (ref 78.0–100.0)
Platelets: 253 10*3/uL (ref 150–400)
RBC: 4.34 MIL/uL (ref 4.22–5.81)
RDW: 14.6 % (ref 11.5–15.5)
WBC: 6 10*3/uL (ref 4.0–10.5)

## 2016-12-25 MED ORDER — HYDROMORPHONE HCL 1 MG/ML IJ SOLN
0.5000 mg | Freq: Once | INTRAMUSCULAR | Status: AC
  Administered 2016-12-25: 0.5 mg via INTRAVENOUS
  Filled 2016-12-25: qty 1

## 2016-12-25 MED ORDER — ENOXAPARIN SODIUM 40 MG/0.4ML ~~LOC~~ SOLN
40.0000 mg | SUBCUTANEOUS | Status: DC
  Filled 2016-12-25: qty 0.4

## 2016-12-25 MED ORDER — HYDROMORPHONE HCL 2 MG/ML IJ SOLN
1.0000 mg | INTRAMUSCULAR | Status: DC | PRN
Start: 2016-12-25 — End: 2016-12-26
  Administered 2016-12-25: 1 mg via INTRAVENOUS
  Administered 2016-12-25: 2 mg via INTRAVENOUS
  Filled 2016-12-25 (×2): qty 1

## 2016-12-25 MED ORDER — HYDROMORPHONE HCL 1 MG/ML IJ SOLN: 1.0000 mg | INTRAMUSCULAR | Status: DC | PRN

## 2016-12-25 MED ORDER — SULFASALAZINE 500 MG PO TABS
2000.0000 mg | ORAL_TABLET | Freq: Two times a day (BID) | ORAL | Status: DC
  Administered 2016-12-25 – 2016-12-26 (×2): 2000 mg via ORAL
  Filled 2016-12-25 (×3): qty 4

## 2016-12-25 MED ORDER — PANTOPRAZOLE SODIUM 40 MG IV SOLR
40.0000 mg | Freq: Every day | INTRAVENOUS | Status: DC
  Administered 2016-12-25: 40 mg via INTRAVENOUS
  Filled 2016-12-25: qty 40

## 2016-12-25 MED ORDER — METOPROLOL TARTRATE 25 MG PO TABS: 25.0000 mg | ORAL_TABLET | Freq: Two times a day (BID) | ORAL | Status: DC

## 2016-12-25 MED ORDER — ONDANSETRON HCL 4 MG/2ML IJ SOLN
4.0000 mg | Freq: Four times a day (QID) | INTRAMUSCULAR | Status: DC | PRN
  Administered 2016-12-25: 4 mg via INTRAVENOUS
  Filled 2016-12-25: qty 2

## 2016-12-25 MED ORDER — ORAL CARE MOUTH RINSE
15.0000 mL | Freq: Two times a day (BID) | OROMUCOSAL | Status: DC
  Administered 2016-12-25 (×2): 15 mL via OROMUCOSAL

## 2016-12-25 MED ORDER — ONDANSETRON 4 MG PO TBDP: 4.0000 mg | ORAL_TABLET | Freq: Four times a day (QID) | ORAL | Status: DC | PRN

## 2016-12-25 MED ORDER — IRBESARTAN 300 MG PO TABS
300.0000 mg | ORAL_TABLET | Freq: Every day | ORAL | Status: DC
  Administered 2016-12-25 – 2016-12-26 (×2): 300 mg via ORAL
  Filled 2016-12-25 (×2): qty 1

## 2016-12-25 MED ORDER — METOPROLOL TARTRATE 5 MG/5ML IV SOLN
5.0000 mg | Freq: Four times a day (QID) | INTRAVENOUS | Status: DC
  Administered 2016-12-25 – 2016-12-26 (×5): 5 mg via INTRAVENOUS
  Filled 2016-12-25 (×5): qty 5

## 2016-12-25 NOTE — Progress Notes (Signed)
Pt just vomited approximately 300 cc.  Sitting up on the side of the bed because he states it is more comfortable that way.  Wife and daughter at the bedside.

## 2016-12-25 NOTE — Progress Notes (Signed)
Subjective: Pt with some n/v this AM. Feels great.    Objective: Vital signs in last 24 hours: Temp:  [97.3 F (36.3 C)-97.8 F (36.6 C)] 97.7 F (36.5 C) (03/18 1053) Pulse Rate:  [78-110] 78 (03/18 1053) Resp:  [9-32] 18 (03/18 1053) BP: (124-174)/(70-106) 140/79 (03/18 1053) SpO2:  [88 %-100 %] 95 % (03/18 1053) Weight:  [89.9 kg (198 lb 4.8 oz)] 89.9 kg (198 lb 4.8 oz) (03/18 0402) Last BM Date: 12/24/16  Intake/Output from previous day: 03/17 0701 - 03/18 0700 In: 1779.2 [I.V.:1279.2; IV Piggyback:500] Out: 0  Intake/Output this shift: Total I/O In: 0  Out: 300 [Emesis/NG output:300]  General appearance: alert and cooperative GI: soft, non-tender; bowel sounds normal; no masses,  no organomegaly and min incarcerated ventral hernia  Lab Results:   Recent Labs  12/24/16 2104 12/25/16 0537  WBC 7.9 6.0  HGB 12.9* 12.5*  HCT 39.4 39.6  PLT 239 253   BMET  Recent Labs  12/24/16 2104 12/25/16 0537  NA 134* 140  K 3.8 4.3  CL 100* 106  CO2 23 24  GLUCOSE 123* 170*  BUN 17 15  CREATININE 0.81 0.82  CALCIUM 9.0 9.0   PT/INR No results for input(s): LABPROT, INR in the last 72 hours. ABG No results for input(s): PHART, HCO3 in the last 72 hours.  Invalid input(s): PCO2, PO2  Studies/Results: Ct Angio Chest/abd/pel For Dissection W And/or Wo Contrast  Result Date: 12/25/2016 CLINICAL DATA:  76 year old male with mid abdominal pain. Evaluate for dissection. EXAM: CT ANGIOGRAPHY CHEST, ABDOMEN AND PELVIS TECHNIQUE: Multidetector CT imaging through the chest, abdomen and pelvis was performed using the standard protocol during bolus administration of intravenous contrast. Multiplanar reconstructed images and MIPs were obtained and reviewed to evaluate the vascular anatomy. CONTRAST:  100 cc Isovue 370 COMPARISON:  Chest radiograph dated 12/14/2004 FINDINGS: Evaluation is limited due to streak artifact caused by patient's arms. CTA CHEST FINDINGS  Cardiovascular: There is no cardiomegaly or pericardial effusion. Coronary vascular calcification primarily involving the LAD. The thoracic aorta appears unremarkable. No aneurysmal dilatation or evidence of dissection. The origins of the great vessels of the aortic arch appear patent. The central pulmonary arteries appear patent as well. Mediastinum/Nodes: There is no hilar or mediastinal adenopathy. There is mild focal patulous appearance of the upper esophagus containing air which may be related to reflux or bursal. The diverticulitis not excluded. Upper GI may provide better evaluation of the esophagus. Small bilateral low attenuating thyroid nodules noted. Lungs/Pleura: The lungs are clear. There is no pleural effusion or pneumothorax. The central airways are patent. Musculoskeletal: The chest wall soft tissues appear unremarkable. Lower cervical anterior fusion hardware noted. There is mild degenerative changes of the thoracic spine. No acute fracture. Old healed left rib fractures noted. Review of the MIP images confirms the above findings. CTA ABDOMEN AND PELVIS FINDINGS VASCULAR Aorta: Mild aortoiliac atherosclerotic disease. No aneurysmal dilatation or dissection. Celiac: Patent without evidence of aneurysm, dissection, vasculitis or significant stenosis. SMA: Patent without evidence of aneurysm, dissection, vasculitis or significant stenosis. Renals: Both renal arteries are patent without evidence of aneurysm, dissection, vasculitis, fibromuscular dysplasia or significant stenosis. IMA: Patent without evidence of aneurysm, dissection, vasculitis or significant stenosis. Inflow: Patent without evidence of aneurysm, dissection, vasculitis or significant stenosis. Veins: No obvious venous abnormality within the limitations of this arterial phase study. Review of the MIP images confirms the above findings. NON-VASCULAR No intra-abdominal free air or free fluid. Hepatobiliary: Probable mild fatty infiltration  of  the liver. No intrahepatic biliary ductal dilatation. The gallbladder is unremarkable. Pancreas: Unremarkable. No pancreatic ductal dilatation or surrounding inflammatory changes. Spleen: Normal in size without focal abnormality. Adrenals/Urinary Tract: A 2 cm indeterminate left adrenal nodule, most likely an adenoma. MRI may provide better characterization. The right adrenal gland appears unremarkable. There is a 6 mm nonobstructing right renal inferior pole stone. Subcentimeter scattered bilateral renal hypodense lesions are too small to characterize but likely represent cysts. There is no hydronephrosis on either side. The visualized ureters and urinary bladder appear unremarkable. Stomach/Bowel: There is a large hiatal hernia containing the majority of the stomach with organo-axial type in gastric volvulus. The stomach is distended with gastric content. There is no gastric wall thickening or definite evidence of inflammatory changes. The degree of gastric outlet obstruction is not entirely excluded. Clinical correlation is recommended. There is no small bowel obstruction or active inflammation. There is sigmoid diverticulosis without active inflammatory changes. The appendix is not visualized with certainty. No inflammatory changes identified in the right lower quadrant. Lymphatic: No adenopathy. Reproductive: The prostate gland is not visualized, likely surgically absent. Other: Midline vertical anterior pelvic wall incisional scar. Small fat containing supraumbilical hernia without associated inflammatory changes. Musculoskeletal: Degenerative changes of the spine. No acute fracture. Review of the MIP images confirms the above findings. IMPRESSION: 1. No aortic aneurysm or dissection. No CT evidence of central pulmonary artery embolus. 2. Large hiatal hernia containing the medullary of the stomach with gastric volvulus. There is distention of the herniated portion of the stomach. Correlation with clinical  exam is recommended to evaluate for the possibility of gastric outlet obstruction. There is however no associated inflammatory changes in the herniated portion of the stomach. No evidence of gastric wall emphysema. 3. Sigmoid diverticulosis without active inflammatory changes. 4. **An incidental finding of potential clinical significance has been found. A 2 cm left adrenal nodule, indeterminate, likely an adenoma. MRI may provide better characterisation.** Electronically Signed   By: Anner Crete M.D.   On: 12/25/2016 00:06    Anti-infectives: Anti-infectives    None      Assessment/Plan: Large hiatal hernia  Ventral hernia 1. Will start FLD and assess his tolerance.  I d/w the pt if pain begins may need NPO and potentially HHR +/- concurrent VHR.   2.  amb as tol   LOS: 0 days    Rosario Jacks., [facility] 12/25/2016

## 2016-12-25 NOTE — ED Notes (Signed)
O2 sats noted to be 88% on RA.  Pt placed on 2L of O2 w/ an improvement of O2 sat to 98%.

## 2016-12-25 NOTE — Progress Notes (Signed)
Pulse has come down nicely to 80, NSR.  Pt tolerating full liquid diet and states after he vomited earlier this morning, his pain was better.  Family at bedside.

## 2016-12-25 NOTE — Progress Notes (Signed)
Patient transferred from ED via stretcher with RN. Patient awake, oriented and able to move himself over to the bed. VSS at this time, pain decreased per patient and he is comfortable. Wife and daughter at bedside, will continue to monitor.

## 2016-12-25 NOTE — Progress Notes (Signed)
Watching pulse rate with central monitoring, gave lopressor IV, will continue to monitor.  Rate 104 at present.

## 2016-12-25 NOTE — H&P (Signed)
Daniel Dalton. is an 76 y.o. male.   Chief Complaint: Abdominal pain with history of large hiatal hernia HPI: Patient with known hiatal hernia.  Came in to day with several week of worsening pain, not in his chest, mostly in his abdomen.  It is near a site where he has a palpable ventral hernia.  CT scan shows a large hiatal, possibly paraesophageal hernia, but no signs of ischemia or obstruction.  Patient wants to drink fluids.  Past Medical History:  Diagnosis Date  . Arrhythmia   . Arthritis   . Colitis   . Family history of anesthesia complication    mother post- op NV  . Heart murmur   . History of kidney stones   . Hypertension   . Paroxysmal atrial fibrillation (HCC)    history of  . Prostate cancer ([place])    in remission  . Sleep apnea    uses CPAP  . Umbilical hernia     Past Surgical History:  Procedure Laterality Date  . ANTERIOR CERVICAL DECOMP/DISCECTOMY FUSION N/A 11/21/2013   Procedure: Cervical four-five, Cervical five-six, Cervical six-seven anterior cervical decompression with fusion plating and bonegraft;  Surgeon: Hosie Spangle, MD;  Location: [facility];  Service: Neurosurgery;  Laterality: N/A;  Cervical four-five, Cervical five-six, Cervical six-seven anterior cervical decompression with fusion plating and bonegraft  . BACK SURGERY    . LEG SURGERY Right   . NASAL SINUS SURGERY    . PROSTATECTOMY      Family History  Problem Relation Age of Onset  . Parkinsonism Father 26    deceased  . Dementia Mother 33    deceased  . Anesthesia problems Mother   . Hypertension Brother 68    alive  . Hypertension Sister 75    alive  . Hypothyroidism Sister    Social History:  reports that he quit smoking about 47 years ago. His smoking use included Cigarettes. He has a 7.50 pack-year smoking history. He has quit using smokeless tobacco. His smokeless tobacco use included Chew. He reports that he does not drink alcohol or use drugs.  Allergies:    Allergies  Allergen Reactions  . Fluorescein Rash     (Not in a hospital admission)  Results for orders placed or performed during the hospital encounter of 12/24/16 (from the past 48 hour(s))  Lipase, blood     Status: Abnormal   Collection Time: 12/24/16  9:04 PM  Result Value Ref Range   Lipase 58 (H) 11 - 51 U/L  Comprehensive metabolic panel     Status: Abnormal   Collection Time: 12/24/16  9:04 PM  Result Value Ref Range   Sodium 134 (L) 135 - 145 mmol/L   Potassium 3.8 3.5 - 5.1 mmol/L   Chloride 100 (L) 101 - 111 mmol/L   CO2 23 22 - 32 mmol/L   Glucose, Bld 123 (H) 65 - 99 mg/dL   BUN 17 6 - 20 mg/dL   Creatinine, Ser 0.81 0.61 - 1.24 mg/dL   Calcium 9.0 8.9 - 10.3 mg/dL   Total Protein 7.6 6.5 - 8.1 g/dL   Albumin 4.5 3.5 - 5.0 g/dL   AST 26 15 - 41 U/L   ALT 18 17 - 63 U/L   Alkaline Phosphatase 60 38 - 126 U/L   Total Bilirubin 0.6 0.3 - 1.2 mg/dL   GFR calc non Af Amer >60 >60 mL/min   GFR calc Af Amer >60 >60 mL/min  Comment: (NOTE) The eGFR has been calculated using the CKD EPI equation. This calculation has not been validated in all clinical situations. eGFR's persistently <60 mL/min signify possible Chronic Kidney Disease.    Anion gap 11 5 - 15  CBC     Status: Abnormal   Collection Time: 12/24/16  9:04 PM  Result Value Ref Range   WBC 7.9 4.0 - 10.5 K/uL   RBC 4.39 4.22 - 5.81 MIL/uL   Hemoglobin 12.9 (L) 13.0 - 17.0 g/dL   HCT 39.4 39.0 - 52.0 %   MCV 89.7 78.0 - 100.0 fL   MCH 29.4 26.0 - 34.0 pg   MCHC 32.7 30.0 - 36.0 g/dL   RDW 14.4 11.5 - 15.5 %   Platelets 239 150 - 400 K/uL  Urinalysis, Routine w reflex microscopic     Status: Abnormal   Collection Time: 12/24/16  9:05 PM  Result Value Ref Range   Color, Urine AMBER (A) YELLOW    Comment: BIOCHEMICALS MAY BE AFFECTED BY COLOR   APPearance CLEAR CLEAR   Specific Gravity, Urine 1.027 1.005 - 1.030   pH 5.0 5.0 - 8.0   Glucose, UA NEGATIVE NEGATIVE mg/dL   Hgb urine dipstick  NEGATIVE NEGATIVE   Bilirubin Urine NEGATIVE NEGATIVE   Ketones, ur 5 (A) NEGATIVE mg/dL   Protein, ur 100 (A) NEGATIVE mg/dL   Nitrite NEGATIVE NEGATIVE   Leukocytes, UA NEGATIVE NEGATIVE   RBC / HPF 0-5 0 - 5 RBC/hpf   WBC, UA 0-5 0 - 5 WBC/hpf   Bacteria, UA NONE SEEN NONE SEEN   Squamous Epithelial / LPF NONE SEEN NONE SEEN   Mucous PRESENT   I-Stat CG4 Lactic Acid, ED     Status: None   Collection Time: 12/24/16 10:28 PM  Result Value Ref Range   Lactic Acid, Venous 1.36 0.5 - 1.9 mmol/L   Ct Angio Chest/abd/pel For Dissection W And/or Wo Contrast  Result Date: 12/25/2016 CLINICAL DATA:  76 year old male with mid abdominal pain. Evaluate for dissection. EXAM: CT ANGIOGRAPHY CHEST, ABDOMEN AND PELVIS TECHNIQUE: Multidetector CT imaging through the chest, abdomen and pelvis was performed using the standard protocol during bolus administration of intravenous contrast. Multiplanar reconstructed images and MIPs were obtained and reviewed to evaluate the vascular anatomy. CONTRAST:  100 cc Isovue 370 COMPARISON:  Chest radiograph dated 12/14/2004 FINDINGS: Evaluation is limited due to streak artifact caused by patient's arms. CTA CHEST FINDINGS Cardiovascular: There is no cardiomegaly or pericardial effusion. Coronary vascular calcification primarily involving the LAD. The thoracic aorta appears unremarkable. No aneurysmal dilatation or evidence of dissection. The origins of the great vessels of the aortic arch appear patent. The central pulmonary arteries appear patent as well. Mediastinum/Nodes: There is no hilar or mediastinal adenopathy. There is mild focal patulous appearance of the upper esophagus containing air which may be related to reflux or bursal. The diverticulitis not excluded. Upper GI may provide better evaluation of the esophagus. Small bilateral low attenuating thyroid nodules noted. Lungs/Pleura: The lungs are clear. There is no pleural effusion or pneumothorax. The central  airways are patent. Musculoskeletal: The chest wall soft tissues appear unremarkable. Lower cervical anterior fusion hardware noted. There is mild degenerative changes of the thoracic spine. No acute fracture. Old healed left rib fractures noted. Review of the MIP images confirms the above findings. CTA ABDOMEN AND PELVIS FINDINGS VASCULAR Aorta: Mild aortoiliac atherosclerotic disease. No aneurysmal dilatation or dissection. Celiac: Patent without evidence of aneurysm, dissection, vasculitis or significant stenosis. SMA:  Patent without evidence of aneurysm, dissection, vasculitis or significant stenosis. Renals: Both renal arteries are patent without evidence of aneurysm, dissection, vasculitis, fibromuscular dysplasia or significant stenosis. IMA: Patent without evidence of aneurysm, dissection, vasculitis or significant stenosis. Inflow: Patent without evidence of aneurysm, dissection, vasculitis or significant stenosis. Veins: No obvious venous abnormality within the limitations of this arterial phase study. Review of the MIP images confirms the above findings. NON-VASCULAR No intra-abdominal free air or free fluid. Hepatobiliary: Probable mild fatty infiltration of the liver. No intrahepatic biliary ductal dilatation. The gallbladder is unremarkable. Pancreas: Unremarkable. No pancreatic ductal dilatation or surrounding inflammatory changes. Spleen: Normal in size without focal abnormality. Adrenals/Urinary Tract: A 2 cm indeterminate left adrenal nodule, most likely an adenoma. MRI may provide better characterization. The right adrenal gland appears unremarkable. There is a 6 mm nonobstructing right renal inferior pole stone. Subcentimeter scattered bilateral renal hypodense lesions are too small to characterize but likely represent cysts. There is no hydronephrosis on either side. The visualized ureters and urinary bladder appear unremarkable. Stomach/Bowel: There is a large hiatal hernia containing the  majority of the stomach with organo-axial type in gastric volvulus. The stomach is distended with gastric content. There is no gastric wall thickening or definite evidence of inflammatory changes. The degree of gastric outlet obstruction is not entirely excluded. Clinical correlation is recommended. There is no small bowel obstruction or active inflammation. There is sigmoid diverticulosis without active inflammatory changes. The appendix is not visualized with certainty. No inflammatory changes identified in the right lower quadrant. Lymphatic: No adenopathy. Reproductive: The prostate gland is not visualized, likely surgically absent. Other: Midline vertical anterior pelvic wall incisional scar. Small fat containing supraumbilical hernia without associated inflammatory changes. Musculoskeletal: Degenerative changes of the spine. No acute fracture. Review of the MIP images confirms the above findings. IMPRESSION: 1. No aortic aneurysm or dissection. No CT evidence of central pulmonary artery embolus. 2. Large hiatal hernia containing the medullary of the stomach with gastric volvulus. There is distention of the herniated portion of the stomach. Correlation with clinical exam is recommended to evaluate for the possibility of gastric outlet obstruction. There is however no associated inflammatory changes in the herniated portion of the stomach. No evidence of gastric wall emphysema. 3. Sigmoid diverticulosis without active inflammatory changes. 4. **An incidental finding of potential clinical significance has been found. A 2 cm left adrenal nodule, indeterminate, likely an adenoma. MRI may provide better characterisation.** Electronically Signed   By: Anner Crete M.D.   On: 12/25/2016 00:06    Review of Systems  Cardiovascular: Negative for chest pain.  Gastrointestinal: Positive for abdominal pain.    Blood pressure (!) 174/96, pulse (!) 110, temperature 97.3 F (36.3 C), temperature source Oral, resp.  rate (!) 21, SpO2 97 %. Physical Exam  Constitutional: He is oriented to person, place, and time. He appears well-developed and well-nourished.  HENT:  Head: Normocephalic and atraumatic.  Eyes: Conjunctivae and EOM are normal. Pupils are equal, round, and reactive to light.  Neck: Normal range of motion.  Questionable atrophy of the right neck  Cardiovascular: Normal rate, regular rhythm and normal heart sounds.   No atrial fibrillation currently  Respiratory: Effort normal and breath sounds normal.  GI: Soft. Bowel sounds are normal. There is tenderness (mild pain).    Musculoskeletal: Normal range of motion.  Neurological: He is alert and oriented to person, place, and time. He has normal reflexes.  Skin: Skin is warm and dry.  Psychiatric: He has a  normal mood and affect. His behavior is normal. Judgment and thought content normal.     Assessment/Plan Large hiatal hernia with pain. Ventral hernia with pain. No chest pain No lactic acidosis or leukocytosis.  I do not believe that this patient needs immediate surgery, and I am unsure as to whether or not his current symptoms are due to incarcerated hiatal hernia.  Will admit for pain control and possible surgery   Judeth Horn, MD 12/25/2016, 2:42 AM

## 2016-12-25 NOTE — ED Provider Notes (Signed)
Patient signed out to me to follow-up on CT scan. Patient experiencing intermittent postprandial upper abdominal pain over the last couple of months. Tonight he had severe pain that has been unrelenting. CT scan has been followed and shows evidence of hiatal hernia with passage of stomach through the hernia and possible volvulus. Patient has required multiple doses of analgesia with only partial relief of his pain. Patient has been seen by Dr. Hulen Skains, general surgery. Patient will be admitted to the hospital.   Orpah Greek, MD 12/25/16 [phone]

## 2016-12-26 MED ORDER — PANTOPRAZOLE SODIUM 40 MG PO TBEC: 40.0000 mg | DELAYED_RELEASE_TABLET | Freq: Every day | ORAL | Status: DC

## 2016-12-26 NOTE — Progress Notes (Signed)
Subjective: No more abd pain, + flatus  Objective: Vital signs in last 24 hours: Temp:  [97.7 F (36.5 C)-99.1 F (37.3 C)] 97.7 F (36.5 C) (03/19 0555) Pulse Rate:  [53-77] 53 (03/19 1203) Resp:  [17-19] 19 (03/19 0555) BP: (114-151)/(60-78) 135/73 (03/19 1203) SpO2:  [95 %-99 %] 99 % (03/19 1203) Last BM Date: 12/26/16  Intake/Output from previous day: 03/18 0701 - 03/19 0700 In: 2773 [P.O.:720; I.V.:2053] Out: 300 [Emesis/NG output:300] Intake/Output this shift: No intake/output data recorded.  General appearance: alert and cooperative Resp: clear to auscultation bilaterally Cardio: regular rate and rhythm GI: soft, midline hernia similar to admit note, NT, +BS  Lab Results:   Recent Labs  12/24/16 2104 12/25/16 0537  WBC 7.9 6.0  HGB 12.9* 12.5*  HCT 39.4 39.6  PLT 239 253   BMET  Recent Labs  12/24/16 2104 12/25/16 0537  NA 134* 140  K 3.8 4.3  CL 100* 106  CO2 23 24  GLUCOSE 123* 170*  BUN 17 15  CREATININE 0.81 0.82  CALCIUM 9.0 9.0   PT/INR No results for input(s): LABPROT, INR in the last 72 hours. ABG No results for input(s): PHART, HCO3 in the last 72 hours.  Invalid input(s): PCO2, PO2  Studies/Results: Ct Angio Chest/abd/pel For Dissection W And/or Wo Contrast  Result Date: 12/25/2016 CLINICAL DATA:  76 year old male with mid abdominal pain. Evaluate for dissection. EXAM: CT ANGIOGRAPHY CHEST, ABDOMEN AND PELVIS TECHNIQUE: Multidetector CT imaging through the chest, abdomen and pelvis was performed using the standard protocol during bolus administration of intravenous contrast. Multiplanar reconstructed images and MIPs were obtained and reviewed to evaluate the vascular anatomy. CONTRAST:  100 cc Isovue 370 COMPARISON:  Chest radiograph dated 12/14/2004 FINDINGS: Evaluation is limited due to streak artifact caused by patient's arms. CTA CHEST FINDINGS Cardiovascular: There is no cardiomegaly or pericardial effusion. Coronary vascular  calcification primarily involving the LAD. The thoracic aorta appears unremarkable. No aneurysmal dilatation or evidence of dissection. The origins of the great vessels of the aortic arch appear patent. The central pulmonary arteries appear patent as well. Mediastinum/Nodes: There is no hilar or mediastinal adenopathy. There is mild focal patulous appearance of the upper esophagus containing air which may be related to reflux or bursal. The diverticulitis not excluded. Upper GI may provide better evaluation of the esophagus. Small bilateral low attenuating thyroid nodules noted. Lungs/Pleura: The lungs are clear. There is no pleural effusion or pneumothorax. The central airways are patent. Musculoskeletal: The chest wall soft tissues appear unremarkable. Lower cervical anterior fusion hardware noted. There is mild degenerative changes of the thoracic spine. No acute fracture. Old healed left rib fractures noted. Review of the MIP images confirms the above findings. CTA ABDOMEN AND PELVIS FINDINGS VASCULAR Aorta: Mild aortoiliac atherosclerotic disease. No aneurysmal dilatation or dissection. Celiac: Patent without evidence of aneurysm, dissection, vasculitis or significant stenosis. SMA: Patent without evidence of aneurysm, dissection, vasculitis or significant stenosis. Renals: Both renal arteries are patent without evidence of aneurysm, dissection, vasculitis, fibromuscular dysplasia or significant stenosis. IMA: Patent without evidence of aneurysm, dissection, vasculitis or significant stenosis. Inflow: Patent without evidence of aneurysm, dissection, vasculitis or significant stenosis. Veins: No obvious venous abnormality within the limitations of this arterial phase study. Review of the MIP images confirms the above findings. NON-VASCULAR No intra-abdominal free air or free fluid. Hepatobiliary: Probable mild fatty infiltration of the liver. No intrahepatic biliary ductal dilatation. The gallbladder is  unremarkable. Pancreas: Unremarkable. No pancreatic ductal dilatation or surrounding inflammatory changes.  Spleen: Normal in size without focal abnormality. Adrenals/Urinary Tract: A 2 cm indeterminate left adrenal nodule, most likely an adenoma. MRI may provide better characterization. The right adrenal gland appears unremarkable. There is a 6 mm nonobstructing right renal inferior pole stone. Subcentimeter scattered bilateral renal hypodense lesions are too small to characterize but likely represent cysts. There is no hydronephrosis on either side. The visualized ureters and urinary bladder appear unremarkable. Stomach/Bowel: There is a large hiatal hernia containing the majority of the stomach with organo-axial type in gastric volvulus. The stomach is distended with gastric content. There is no gastric wall thickening or definite evidence of inflammatory changes. The degree of gastric outlet obstruction is not entirely excluded. Clinical correlation is recommended. There is no small bowel obstruction or active inflammation. There is sigmoid diverticulosis without active inflammatory changes. The appendix is not visualized with certainty. No inflammatory changes identified in the right lower quadrant. Lymphatic: No adenopathy. Reproductive: The prostate gland is not visualized, likely surgically absent. Other: Midline vertical anterior pelvic wall incisional scar. Small fat containing supraumbilical hernia without associated inflammatory changes. Musculoskeletal: Degenerative changes of the spine. No acute fracture. Review of the MIP images confirms the above findings. IMPRESSION: 1. No aortic aneurysm or dissection. No CT evidence of central pulmonary artery embolus. 2. Large hiatal hernia containing the medullary of the stomach with gastric volvulus. There is distention of the herniated portion of the stomach. Correlation with clinical exam is recommended to evaluate for the possibility of gastric outlet  obstruction. There is however no associated inflammatory changes in the herniated portion of the stomach. No evidence of gastric wall emphysema. 3. Sigmoid diverticulosis without active inflammatory changes. 4. **An incidental finding of potential clinical significance has been found. A 2 cm left adrenal nodule, indeterminate, likely an adenoma. MRI may provide better characterisation.** Electronically Signed   By: Anner Crete M.D.   On: 12/25/2016 00:06    Anti-infectives: Anti-infectives    None      Assessment/Plan: Large hiatal hernia, ventral hernia - try soft diet now and if tolerates, likely D/C this afternoon. Plan F/U in the office by Dr. Rosendo Gros or Dr. Kieth Brightly  LOS: 1 day    Georganna Skeans E 12/26/2016

## 2016-12-26 NOTE — Progress Notes (Signed)
Telemetry tech reports bradycardia and junctional rhythm, will hold metoprolol per md order parameters

## 2016-12-26 NOTE — Progress Notes (Signed)
Discharge instructions reviewed with pt.  Pt verbalized understanding and had no questions.  Pt discharged in stable condition with wife.  Eliezer Bottom [place]

## 2016-12-30 NOTE — Discharge Summary (Signed)
[facility] Discharge Summary   Patient ID: Daniel Dalton. MRN: [id number] DOB/AGE: 1941-03-20 76 y.o.  Admit date: 12/24/2016 Discharge date: 12/30/2016  Discharge Diagnosis Hiatal hernia Ventral hernia  Abdominal pain  Consultants None   Imaging: CT ANGIO CHEST/ABD/PELV W/ -  1. No aortic aneurysm or dissection. No CT evidence of central pulmonary artery embolus. 2. Large hiatal hernia containing the medullary of the stomach with gastric volvulus. There is distention of the herniated portion of the stomach. Correlation with clinical exam is recommended to evaluate for the possibility of gastric outlet obstruction. There is however no associated inflammatory changes in the herniated portion of the stomach. No evidence of gastric wall emphysema. 3. Sigmoid diverticulosis without active inflammatory changes. 4. **An incidental finding of potential clinical significance has been found. A 2 cm left adrenal nodule, indeterminate, likely an adenoma. MRI may provide better characterisation.**  Procedures None   Hospital Course:  76 year-old patient with a known hiatal hernia who presented to the emergency department with a several week history of worsening pain, not in his chest, mostly in his abdomen.  It is near a site where he has a palpable ventral hernia.  CT scan shows a large hiatal, possibly paraesophageal hernia, but no signs of ischemia or obstruction. Patient was admitted for pain control and possible surgery. On hospital day one the patient was tolerating a full liquid diet without exacerbation of his abdominal pain and appeared to be improving clinically. On hospital day#2 the patient report complete resolution of his abdominal pain and denied nausea vomiting. He was discharged home after tolerating a soft diet with plans for outpatient follow up and elective repair of hiatal hernia.     Allergies as of 12/26/2016      Reactions   Fluorescein Rash       Medication List    TAKE these medications   aspirin EC 81 MG tablet Take 1 tablet (81 mg total) by mouth daily.   irbesartan 300 MG tablet Commonly known as:  AVAPRO Take 300 mg by mouth daily.   metoprolol tartrate 25 MG tablet Commonly known as:  LOPRESSOR Take 1 tablet (25 mg total) by mouth 2 (two) times daily.   sulfaSALAzine 500 MG tablet Commonly known as:  AZULFIDINE Take 2,000 mg by mouth 2 (two) times daily.        Follow-up Information    Reyes Ivan, MD. Schedule an appointment as soon as possible for a visit.   Specialty:  General Surgery Why:  for follow up Contact information: [address] [place] [address] [phone]           Signed: Obie Dredge, [facility] 12/30/2016, 1:55 PM Pager: [phone] Consults: [phone] Mon-Fri 7:00 am-4:30 pm Sat-Sun 7:00 am-11:30 am

## 2017-04-18 ENCOUNTER — Other Ambulatory Visit: Payer: Self-pay | Admitting: Internal Medicine

## 2017-04-18 DIAGNOSIS — K51919 Ulcerative colitis, unspecified with unspecified complications: Secondary | ICD-10-CM

## 2017-04-18 DIAGNOSIS — K625 Hemorrhage of anus and rectum: Secondary | ICD-10-CM

## 2017-04-18 DIAGNOSIS — R109 Unspecified abdominal pain: Secondary | ICD-10-CM

## 2017-04-19 ENCOUNTER — Encounter: Payer: Self-pay | Admitting: Internal Medicine

## 2017-04-20 ENCOUNTER — Ambulatory Visit
Admission: RE | Admit: 2017-04-20 | Discharge: 2017-04-20 | Disposition: A | Discharge status: Home / Self Care | Payer: Medicare Other | Source: Ambulatory Visit | Attending: Internal Medicine | Admitting: Internal Medicine

## 2017-04-20 DIAGNOSIS — R109 Unspecified abdominal pain: Secondary | ICD-10-CM

## 2017-04-20 DIAGNOSIS — K51919 Ulcerative colitis, unspecified with unspecified complications: Secondary | ICD-10-CM

## 2017-04-20 DIAGNOSIS — K625 Hemorrhage of anus and rectum: Secondary | ICD-10-CM

## 2017-04-20 MED ORDER — IOPAMIDOL (ISOVUE-300) INJECTION 61%
100.0000 mL | Freq: Once | INTRAVENOUS | Status: AC | PRN
  Administered 2017-04-20: 100 mL via INTRAVENOUS

## 2017-05-09 ENCOUNTER — Other Ambulatory Visit: Payer: Self-pay | Admitting: Internal Medicine

## 2017-05-09 DIAGNOSIS — K51811 Other ulcerative colitis with rectal bleeding: Secondary | ICD-10-CM

## 2017-05-09 DIAGNOSIS — K625 Hemorrhage of anus and rectum: Secondary | ICD-10-CM

## 2017-05-09 DIAGNOSIS — R109 Unspecified abdominal pain: Secondary | ICD-10-CM

## 2017-05-11 ENCOUNTER — Ambulatory Visit
Admission: RE | Admit: 2017-05-11 | Discharge: 2017-05-11 | Disposition: A | Discharge status: Home / Self Care | Payer: Medicare Other | Source: Ambulatory Visit | Attending: Internal Medicine | Admitting: Internal Medicine

## 2017-05-11 DIAGNOSIS — K51811 Other ulcerative colitis with rectal bleeding: Secondary | ICD-10-CM

## 2017-05-11 DIAGNOSIS — R109 Unspecified abdominal pain: Secondary | ICD-10-CM

## 2017-05-11 DIAGNOSIS — K625 Hemorrhage of anus and rectum: Secondary | ICD-10-CM

## 2017-05-11 MED ORDER — IOPAMIDOL (ISOVUE-300) INJECTION 61%
100.0000 mL | Freq: Once | INTRAVENOUS | Status: AC | PRN
  Administered 2017-05-11: 100 mL via INTRAVENOUS

## 2017-05-23 ENCOUNTER — Ambulatory Visit: Payer: Medicare Other | Admitting: Physician Assistant

## 2017-06-07 ENCOUNTER — Ambulatory Visit: Payer: Medicare Other | Admitting: Internal Medicine

## 2017-06-22 ENCOUNTER — Other Ambulatory Visit: Payer: Self-pay | Admitting: Nurse Practitioner

## 2018-06-26 ENCOUNTER — Other Ambulatory Visit: Payer: Self-pay | Admitting: Nurse Practitioner

## 2018-07-31 ENCOUNTER — Encounter: Payer: Self-pay | Admitting: Nurse Practitioner

## 2018-08-20 NOTE — Progress Notes (Signed)
Cardiology Office Note   Date:  08/21/2018   ID:  Daniel Dalton., DOB December 31, 1940, MRN [id number]  PCP:  Daniel Solian, MD  Cardiologist:  Daniel Merle, NP EP: None  Chief Complaint  Patient presents with  . Follow-up    atrial fibrillation      History of Present Illness: Daniel Dalton. is a 77 y.o. male with a PMH of HTN, paroxysmal atrial fibrillation (refused anticoagulation in the past), UC, and OSA, who presents for routine follow up.   He was last seen by Daniel Merle, NP in 2017 and was felt to be doing well. He was maintaining NSR and again refused anticoagulation despite CHA2DS2-VASc Score of 3 (HTN and age >23).   He presents alone today for routine follow-up of his atrial fibrillation. He has no complaints today. He reported having an anxiety with onset of atrial fibrillation but has not felt that sensation recently. He is fairly active, doing calesthetics and walking a mile every day. Denies chest pain, SOB, orthopnea, PND, LE edema, dizziness, lightheadedness, or syncope. He has easy bruising and prolonged bleeding when he cuts himself but no hematochezia, melena, or hematochezia.     Past Medical History:  Diagnosis Date  . Arrhythmia   . Arthritis   . Colitis   . Family history of anesthesia complication    mother post- op NV  . Heart murmur   . History of kidney stones   . Hypertension   . Paroxysmal atrial fibrillation (HCC)    history of  . Prostate cancer ([place])    in remission  . Sleep apnea    uses CPAP  . Umbilical hernia     Past Surgical History:  Procedure Laterality Date  . ANTERIOR CERVICAL DECOMP/DISCECTOMY FUSION N/A 11/21/2013   Procedure: Cervical four-five, Cervical five-six, Cervical six-seven anterior cervical decompression with fusion plating and bonegraft;  Surgeon: Daniel Spangle, MD;  Location: [facility];  Service: Neurosurgery;  Laterality: N/A;  Cervical four-five, Cervical five-six, Cervical six-seven  anterior cervical decompression with fusion plating and bonegraft  . BACK SURGERY    . LEG SURGERY Right   . NASAL SINUS SURGERY    . PROSTATECTOMY       Current Outpatient Medications  Medication Sig Dispense Refill  . aspirin EC 81 MG tablet Take 1 tablet (81 mg total) by mouth daily.    . metoprolol tartrate (LOPRESSOR) 25 MG tablet Take 1 tablet (25 mg total) by mouth 2 (two) times daily. Please keep upcoming appt in November for future refills. Thank you 180 tablet 0  . sulfaSALAzine (AZULFIDINE) 500 MG tablet Take 2,000 mg by mouth 2 (two) times daily.     Marland Kitchen telmisartan (MICARDIS) 80 MG tablet Take 80 mg by mouth daily.      No current facility-administered medications for this visit.     Allergies:   Fluorescein    Social History:  The patient  reports that he quit smoking about 48 years ago. His smoking use included cigarettes. He has a 7.50 pack-year smoking history. He has quit using smokeless tobacco.  His smokeless tobacco use included chew. He reports that he does not drink alcohol or use drugs.   Family History:  The patient's family history includes Anesthesia problems in his mother; Dementia (age of onset: 34) in his mother; Hypertension (age of onset: 48) in his sister; Hypertension (age of onset: 54) in his brother; Hypothyroidism in his sister; Parkinsonism (age of onset: 64)  in his father.    ROS:  Please see the history of present illness.   Otherwise, review of systems are positive for none.   All other systems are reviewed and negative.    PHYSICAL EXAM: VS:  BP 138/82 (BP Location: Left Arm, Patient Position: Sitting, Cuff Size: Normal)   Pulse 61   Ht 5\' 10"  (1.778 m)   Wt 193 lb (87.5 kg)   BMI 27.69 kg/m  , BMI Body mass index is 27.69 kg/m. GEN: Well nourished, well developed, sitting on the exam table in no acute distress HEENT: sclera anicteric Neck: no JVD, carotid bruits, or masses Cardiac: RRR; no murmurs, rubs, or gallops, no edema    Respiratory:  clear to auscultation bilaterally, normal work of breathing GI: soft, nontender, nondistended, +BS MS: no deformity or atrophy Skin: warm and dry, no rash Neuro:  Strength and sensation are intact Psych: euthymic mood, full affect   EKG:  EKG is ordered today. The ekg ordered today demonstrates NSR no STE/D, no TWI.   Recent Labs: No results found for requested labs within last 8760 hours.    Lipid Panel No results found for: CHOL, TRIG, HDL, CHOLHDL, VLDL, LDLCALC, LDLDIRECT    Wt Readings from Last 3 Encounters:  08/21/18 193 lb (87.5 kg)  12/25/16 198 lb 4.8 oz (89.9 kg)  08/30/16 195 lb 6.4 oz (88.6 kg)      Other studies Reviewed: Additional studies/ records that were reviewed today include:   Echocardiogram 02/2013: Study Conclusions  - Left ventricle: The cavity size was normal. Wall thicknesswas normal. Systolic function was normal. The estimatedejection fraction was in the range of 55% to 60%. Wallmotion was normal; there were no regional wall motionabnormalities. Features are consistent with a pseudonormalleft ventricular filling pattern, with concomitantabnormal relaxation and increased filling pressure (grade2 diastolic dysfunction). - Aortic valve: There was no stenosis. - Mitral valve: Mild regurgitation. - Left atrium: The atrium was mildly dilated. - Right ventricle: The cavity size was normal. Systolic function was normal. - Right atrium: The atrium was mildly dilated. - Tricuspid valve: Moderate regurgitation. Peak RV-RA gradient: 55mm Hg (S). - Pulmonary arteries: PA systolic pressure 46-65 mmHg. - Systemic veins: IVC measured 1.9 cm with normal respirophasic variation, suggesting RA pressure 6-10 mmHg.  Impressions: - Normal LV size and systolic function, EF 99-35%. Moderatediastolic dysfunction. Normal RV size and systolicfunction. Moderate tricuspid regurgitation, mild mitralregurgitation. Mild pulmonary  hypertension.    ASSESSMENT AND PLAN:  1. Paroxysmal atrial fibrillation: maintaining NSR without complaints of palpitations/anxiety sensation experienced with prior episodes. Again discussed stroke risk, however patient still refusing anticoagulation despite CHA2DS2-VASc Score of 3 (HTN and age >47).  - Continue aspirin - Continue metoprolol for rate control  2. HTN: BP stable.  - Continue metoprolol and telmisartan - Continue active lifestyle and DASH diet  3. Diastolic dysfunction: last echo in 2014 with G2DD, EF 55-60%. No history of volume overload. No complaints of LE edema, orthopnea, or PND.  - Continue routine monitoring - Continue low salt diet  4. OSA: compliant with CPAP - Continue CPAP   Current medicines are reviewed at length with the patient today.  The patient does not have concerns regarding medicines.  The following changes have been made:  no change  Labs/ tests ordered today include: None  Orders Placed This Encounter  Procedures  . EKG 12-Lead     Disposition:   FU with Daniel Merle, NP in 1 year.  Signed, Abigail Butts, PA-C  08/21/2018 8:34 AM

## 2018-08-21 ENCOUNTER — Ambulatory Visit: Payer: Medicare Other | Admitting: Medical

## 2018-08-21 ENCOUNTER — Encounter: Payer: Self-pay | Admitting: Medical

## 2018-08-21 VITALS — BP 138/82 | HR 61 | Ht 70.0 in | Wt 193.0 lb

## 2018-08-21 DIAGNOSIS — G4733 Obstructive sleep apnea (adult) (pediatric): Secondary | ICD-10-CM

## 2018-08-21 DIAGNOSIS — I480 Paroxysmal atrial fibrillation: Secondary | ICD-10-CM

## 2018-08-21 DIAGNOSIS — I159 Secondary hypertension, unspecified: Secondary | ICD-10-CM | POA: Diagnosis not present

## 2018-08-21 NOTE — Patient Instructions (Addendum)
We will be checking the following labs today - None   Medication Instructions:    Continue with your current medicines.     Testing/Procedures To Be Arranged:  N/A  Follow-Up:   See Truitt Merle, NP in 1 year    Other Special Instructions:   You would benefit from being on a blood thinner for your atrial fibrillation to prevent strokes in the future. If you change your mind, please notify the office and we will arrange.     If you need a refill on your cardiac medications before your next appointment, please call your pharmacy.   Call the [place] office at [phone] if you have any questions, problems or concerns.      DASH Eating Plan DASH stands for "Dietary Approaches to Stop Hypertension." The DASH eating plan is a healthy eating plan that has been shown to reduce high blood pressure (hypertension). It may also reduce your risk for type 2 diabetes, heart disease, and stroke. The DASH eating plan may also help with weight loss. What are tips for following this plan? General guidelines  Avoid eating more than 2,300 mg (milligrams) of salt (sodium) a day. If you have hypertension, you may need to reduce your sodium intake to 1,500 mg a day.  Limit alcohol intake to no more than 1 drink a day for nonpregnant women and 2 drinks a day for men. One drink equals 12 oz of beer, 5 oz of wine, or 1 oz of hard liquor.  Work with your health care provider to maintain a healthy body weight or to lose weight. Ask what an ideal weight is for you.  Get at least 30 minutes of exercise that causes your heart to beat faster (aerobic exercise) most days of the week. Activities may include walking, swimming, or biking.  Work with your health care provider or diet and nutrition specialist (dietitian) to adjust your eating plan to your individual calorie needs. Reading food labels  Check food labels for the amount of sodium per serving. Choose foods  with less than 5 percent of the Daily Value of sodium. Generally, foods with less than 300 mg of sodium per serving fit into this eating plan.  To find whole grains, look for the word "whole" as the first word in the ingredient list. Shopping  Buy products labeled as "low-sodium" or "no salt added."  Buy fresh foods. Avoid canned foods and premade or frozen meals. Cooking  Avoid adding salt when cooking. Use salt-free seasonings or herbs instead of table salt or sea salt. Check with your health care provider or pharmacist before using salt substitutes.  Do not fry foods. Cook foods using healthy methods such as baking, boiling, grilling, and broiling instead.  Cook with heart-healthy oils, such as olive, canola, soybean, or sunflower oil. Meal planning   Eat a balanced diet that includes: ? 5 or more servings of fruits and vegetables each day. At each meal, try to fill half of your plate with fruits and vegetables. ? Up to 6-8 servings of whole grains each day. ? Less than 6 oz of lean meat, poultry, or fish each day. A 3-oz serving of meat is about the same size as a deck of cards. One egg equals 1 oz. ? 2 servings of low-fat dairy each day. ? A serving of nuts, seeds, or beans 5 times each week. ? Heart-healthy fats. Healthy fats called Omega-3 fatty acids are found in foods such as flaxseeds  and coldwater fish, like sardines, salmon, and mackerel.  Limit how much you eat of the following: ? Canned or prepackaged foods. ? Food that is high in trans fat, such as fried foods. ? Food that is high in saturated fat, such as fatty meat. ? Sweets, desserts, sugary drinks, and other foods with added sugar. ? Full-fat dairy products.  Do not salt foods before eating.  Try to eat at least 2 vegetarian meals each week.  Eat more home-cooked food and less restaurant, buffet, and fast food.  When eating at a restaurant, ask that your food be prepared with less salt or no salt, if  possible. What foods are recommended? The items listed may not be a complete list. Talk with your dietitian about what dietary choices are best for you. Grains Whole-grain or whole-wheat bread. Whole-grain or whole-wheat pasta. Brown rice. Modena Morrow. Bulgur. Whole-grain and low-sodium cereals. Pita bread. Low-fat, low-sodium crackers. Whole-wheat flour tortillas. Vegetables Fresh or frozen vegetables (raw, steamed, roasted, or grilled). Low-sodium or reduced-sodium tomato and vegetable juice. Low-sodium or reduced-sodium tomato sauce and tomato paste. Low-sodium or reduced-sodium canned vegetables. Fruits All fresh, dried, or frozen fruit. Canned fruit in natural juice (without added sugar). Meat and other protein foods Skinless chicken or [place]. Ground chicken or [place]. Pork with fat trimmed off. Fish and seafood. Egg whites. Dried beans, peas, or lentils. Unsalted nuts, nut butters, and seeds. Unsalted canned beans. Lean cuts of beef with fat trimmed off. Low-sodium, lean deli meat. Dairy Low-fat (1%) or fat-free (skim) milk. Fat-free, low-fat, or reduced-fat cheeses. Nonfat, low-sodium ricotta or cottage cheese. Low-fat or nonfat yogurt. Low-fat, low-sodium cheese. Fats and oils Soft margarine without trans fats. Vegetable oil. Low-fat, reduced-fat, or light mayonnaise and salad dressings (reduced-sodium). Canola, safflower, olive, soybean, and sunflower oils. Avocado. Seasoning and other foods Herbs. Spices. Seasoning mixes without salt. Unsalted popcorn and pretzels. Fat-free sweets. What foods are not recommended? The items listed may not be a complete list. Talk with your dietitian about what dietary choices are best for you. Grains Baked goods made with fat, such as croissants, muffins, or some breads. Dry pasta or rice meal packs. Vegetables Creamed or fried vegetables. Vegetables in a cheese sauce. Regular canned vegetables (not low-sodium or reduced-sodium). Regular canned  tomato sauce and paste (not low-sodium or reduced-sodium). Regular tomato and vegetable juice (not low-sodium or reduced-sodium). Angie Fava. Olives. Fruits Canned fruit in a light or heavy syrup. Fried fruit. Fruit in cream or butter sauce. Meat and other protein foods Fatty cuts of meat. Ribs. Fried meat. Berniece Salines. Sausage. Bologna and other processed lunch meats. Salami. Fatback. Hotdogs. Bratwurst. Salted nuts and seeds. Canned beans with added salt. Canned or smoked fish. Whole eggs or egg yolks. Chicken or [place] with skin. Dairy Whole or 2% milk, cream, and half-and-half. Whole or full-fat cream cheese. Whole-fat or sweetened yogurt. Full-fat cheese. Nondairy creamers. Whipped toppings. Processed cheese and cheese spreads. Fats and oils Butter. Stick margarine. Lard. Shortening. Ghee. Bacon fat. Tropical oils, such as coconut, palm kernel, or palm oil. Seasoning and other foods Salted popcorn and pretzels. Onion salt, garlic salt, seasoned salt, table salt, and sea salt. Worcestershire sauce. Tartar sauce. Barbecue sauce. Teriyaki sauce. Soy sauce, including reduced-sodium. Steak sauce. Canned and packaged gravies. Fish sauce. Oyster sauce. Cocktail sauce. Horseradish that you find on the shelf. Ketchup. Mustard. Meat flavorings and tenderizers. Bouillon cubes. Hot sauce and Tabasco sauce. Premade or packaged marinades. Premade or packaged taco seasonings. Relishes. Regular salad dressings. Where to  find more information:  National Heart, Lung, and Blood Institute: https://wilson-eaton.com/  American Heart Association: www.heart.org Summary  The DASH eating plan is a healthy eating plan that has been shown to reduce high blood pressure (hypertension). It may also reduce your risk for type 2 diabetes, heart disease, and stroke.  With the DASH eating plan, you should limit salt (sodium) intake to 2,300 mg a day. If you have hypertension, you may need to reduce your sodium intake to 1,500 mg a day.  When  on the DASH eating plan, aim to eat more fresh fruits and vegetables, whole grains, lean proteins, low-fat dairy, and heart-healthy fats.  Work with your health care provider or diet and nutrition specialist (dietitian) to adjust your eating plan to your individual calorie needs. This information is not intended to replace advice given to you by your health care provider. Make sure you discuss any questions you have with your health care provider. Document Released: 09/15/2011 Document Revised: 09/19/2016 Document Reviewed: 09/19/2016 Elsevier Interactive Patient Education  [employer].

## 2018-09-25 ENCOUNTER — Other Ambulatory Visit: Payer: Self-pay | Admitting: Nurse Practitioner

## 2018-11-06 ENCOUNTER — Encounter (HOSPITAL_COMMUNITY): Payer: Self-pay | Admitting: Family Medicine

## 2018-11-06 DIAGNOSIS — M1610 Unilateral primary osteoarthritis, unspecified hip: Secondary | ICD-10-CM | POA: Diagnosis present

## 2018-11-06 DIAGNOSIS — E876 Hypokalemia: Secondary | ICD-10-CM | POA: Diagnosis present

## 2018-11-06 DIAGNOSIS — Z79899 Other long term (current) drug therapy: Secondary | ICD-10-CM

## 2018-11-06 DIAGNOSIS — K311 Adult hypertrophic pyloric stenosis: Secondary | ICD-10-CM | POA: Diagnosis present

## 2018-11-06 DIAGNOSIS — Z820 Family history of epilepsy and other diseases of the nervous system: Secondary | ICD-10-CM

## 2018-11-06 DIAGNOSIS — I471 Supraventricular tachycardia: Secondary | ICD-10-CM | POA: Diagnosis present

## 2018-11-06 DIAGNOSIS — I480 Paroxysmal atrial fibrillation: Secondary | ICD-10-CM | POA: Diagnosis present

## 2018-11-06 DIAGNOSIS — R002 Palpitations: Secondary | ICD-10-CM | POA: Diagnosis present

## 2018-11-06 DIAGNOSIS — Z9079 Acquired absence of other genital organ(s): Secondary | ICD-10-CM

## 2018-11-06 DIAGNOSIS — I2729 Other secondary pulmonary hypertension: Secondary | ICD-10-CM | POA: Diagnosis present

## 2018-11-06 DIAGNOSIS — E872 Acidosis: Secondary | ICD-10-CM | POA: Diagnosis present

## 2018-11-06 DIAGNOSIS — R571 Hypovolemic shock: Secondary | ICD-10-CM | POA: Diagnosis present

## 2018-11-06 DIAGNOSIS — Z8546 Personal history of malignant neoplasm of prostate: Secondary | ICD-10-CM

## 2018-11-06 DIAGNOSIS — K440 Diaphragmatic hernia with obstruction, without gangrene: Principal | ICD-10-CM | POA: Diagnosis present

## 2018-11-06 DIAGNOSIS — E785 Hyperlipidemia, unspecified: Secondary | ICD-10-CM | POA: Diagnosis present

## 2018-11-06 DIAGNOSIS — G92 Toxic encephalopathy: Secondary | ICD-10-CM | POA: Diagnosis not present

## 2018-11-06 DIAGNOSIS — K449 Diaphragmatic hernia without obstruction or gangrene: Secondary | ICD-10-CM | POA: Diagnosis not present

## 2018-11-06 DIAGNOSIS — K5730 Diverticulosis of large intestine without perforation or abscess without bleeding: Secondary | ICD-10-CM | POA: Diagnosis present

## 2018-11-06 DIAGNOSIS — I272 Pulmonary hypertension, unspecified: Secondary | ICD-10-CM | POA: Diagnosis present

## 2018-11-06 DIAGNOSIS — Z981 Arthrodesis status: Secondary | ICD-10-CM

## 2018-11-06 DIAGNOSIS — J9811 Atelectasis: Secondary | ICD-10-CM | POA: Diagnosis present

## 2018-11-06 DIAGNOSIS — K859 Acute pancreatitis without necrosis or infection, unspecified: Secondary | ICD-10-CM | POA: Diagnosis present

## 2018-11-06 DIAGNOSIS — M47816 Spondylosis without myelopathy or radiculopathy, lumbar region: Secondary | ICD-10-CM | POA: Diagnosis present

## 2018-11-06 DIAGNOSIS — I10 Essential (primary) hypertension: Secondary | ICD-10-CM | POA: Diagnosis present

## 2018-11-06 DIAGNOSIS — K920 Hematemesis: Secondary | ICD-10-CM | POA: Diagnosis present

## 2018-11-06 DIAGNOSIS — K2970 Gastritis, unspecified, without bleeding: Secondary | ICD-10-CM | POA: Diagnosis present

## 2018-11-06 DIAGNOSIS — Z7901 Long term (current) use of anticoagulants: Secondary | ICD-10-CM

## 2018-11-06 DIAGNOSIS — R4702 Dysphasia: Secondary | ICD-10-CM | POA: Diagnosis present

## 2018-11-06 DIAGNOSIS — R1310 Dysphagia, unspecified: Secondary | ICD-10-CM | POA: Diagnosis present

## 2018-11-06 DIAGNOSIS — K562 Volvulus: Secondary | ICD-10-CM | POA: Diagnosis present

## 2018-11-06 DIAGNOSIS — G4733 Obstructive sleep apnea (adult) (pediatric): Secondary | ICD-10-CM | POA: Diagnosis present

## 2018-11-06 DIAGNOSIS — K5190 Ulcerative colitis, unspecified, without complications: Secondary | ICD-10-CM | POA: Diagnosis present

## 2018-11-06 DIAGNOSIS — T426X5A Adverse effect of other antiepileptic and sedative-hypnotic drugs, initial encounter: Secondary | ICD-10-CM | POA: Diagnosis not present

## 2018-11-06 DIAGNOSIS — J9601 Acute respiratory failure with hypoxia: Secondary | ICD-10-CM | POA: Diagnosis not present

## 2018-11-06 DIAGNOSIS — Z87442 Personal history of urinary calculi: Secondary | ICD-10-CM

## 2018-11-06 DIAGNOSIS — I081 Rheumatic disorders of both mitral and tricuspid valves: Secondary | ICD-10-CM | POA: Diagnosis present

## 2018-11-06 DIAGNOSIS — Z8249 Family history of ischemic heart disease and other diseases of the circulatory system: Secondary | ICD-10-CM

## 2018-11-06 LAB — URINALYSIS, ROUTINE W REFLEX MICROSCOPIC
BACTERIA UA: NONE SEEN
BILIRUBIN URINE: NEGATIVE
Glucose, UA: NEGATIVE mg/dL
HGB URINE DIPSTICK: NEGATIVE
KETONES UR: 20 mg/dL — AB
LEUKOCYTES UA: NEGATIVE
Nitrite: NEGATIVE
PROTEIN: 100 mg/dL — AB
SPECIFIC GRAVITY, URINE: 1.024 (ref 1.005–1.030)
pH: 5 (ref 5.0–8.0)

## 2018-11-06 LAB — CBC
HEMATOCRIT: 38.9 % — AB (ref 39.0–52.0)
Hemoglobin: 12.5 g/dL — ABNORMAL LOW (ref 13.0–17.0)
MCH: 28.2 pg (ref 26.0–34.0)
MCHC: 32.1 g/dL (ref 30.0–36.0)
MCV: 87.6 fL (ref 80.0–100.0)
NRBC: 0 % (ref 0.0–0.2)
Platelets: 296 10*3/uL (ref 150–400)
RBC: 4.44 MIL/uL (ref 4.22–5.81)
RDW: 14.1 % (ref 11.5–15.5)
WBC: 8.5 10*3/uL (ref 4.0–10.5)

## 2018-11-06 LAB — COMPREHENSIVE METABOLIC PANEL
ALBUMIN: 4.7 g/dL (ref 3.5–5.0)
ALK PHOS: 76 U/L (ref 38–126)
ALT: 14 U/L (ref 0–44)
AST: 19 U/L (ref 15–41)
Anion gap: 11 (ref 5–15)
BUN: 20 mg/dL (ref 8–23)
CALCIUM: 9.6 mg/dL (ref 8.9–10.3)
CHLORIDE: 105 mmol/L (ref 98–111)
CO2: 24 mmol/L (ref 22–32)
Creatinine, Ser: 0.82 mg/dL (ref 0.61–1.24)
GFR calc non Af Amer: >60 mL/min (ref >60)
GLUCOSE: 130 mg/dL — AB (ref 70–99)
Potassium: 4.3 mmol/L (ref 3.5–5.1)
SODIUM: 140 mmol/L (ref 135–145)
Total Bilirubin: 0.9 mg/dL (ref 0.3–1.2)
Total Protein: 8.1 g/dL (ref 6.5–8.1)

## 2018-11-06 LAB — LIPASE, BLOOD: LIPASE: 37 U/L (ref 11–51)

## 2018-11-06 MED ORDER — SODIUM CHLORIDE 0.9% FLUSH
3.0000 mL | Freq: Once | INTRAVENOUS | Status: AC
  Administered 2018-11-07: 3 mL via INTRAVENOUS

## 2018-11-06 NOTE — ED Notes (Signed)
Pt asked to see MD or get something for pain.

## 2018-11-07 ENCOUNTER — Encounter (HOSPITAL_COMMUNITY): Payer: Self-pay

## 2018-11-07 ENCOUNTER — Inpatient Hospital Stay (HOSPITAL_COMMUNITY)
Admission: EM | Admit: 2018-11-07 | Discharge: 2018-11-12 | DRG: 326 | Disposition: A | Discharge status: Home / Self Care | Payer: Medicare Other | Attending: Internal Medicine | Admitting: Internal Medicine

## 2018-11-07 ENCOUNTER — Emergency Department (HOSPITAL_COMMUNITY): Payer: Medicare Other

## 2018-11-07 ENCOUNTER — Inpatient Hospital Stay (HOSPITAL_COMMUNITY): Payer: Medicare Other | Admitting: Certified Registered Nurse Anesthetist

## 2018-11-07 ENCOUNTER — Inpatient Hospital Stay (HOSPITAL_COMMUNITY): Payer: Medicare Other

## 2018-11-07 ENCOUNTER — Encounter (HOSPITAL_COMMUNITY)
Admission: EM | Disposition: A | Discharge status: Home / Self Care | Payer: Self-pay | Source: Home / Self Care | Attending: Internal Medicine

## 2018-11-07 ENCOUNTER — Other Ambulatory Visit: Payer: Self-pay

## 2018-11-07 DIAGNOSIS — K311 Adult hypertrophic pyloric stenosis: Secondary | ICD-10-CM

## 2018-11-07 DIAGNOSIS — G4733 Obstructive sleep apnea (adult) (pediatric): Secondary | ICD-10-CM | POA: Diagnosis present

## 2018-11-07 DIAGNOSIS — K440 Diaphragmatic hernia with obstruction, without gangrene: Secondary | ICD-10-CM

## 2018-11-07 DIAGNOSIS — Z01810 Encounter for preprocedural cardiovascular examination: Secondary | ICD-10-CM

## 2018-11-07 DIAGNOSIS — G92 Toxic encephalopathy: Secondary | ICD-10-CM | POA: Diagnosis not present

## 2018-11-07 DIAGNOSIS — K920 Hematemesis: Secondary | ICD-10-CM

## 2018-11-07 DIAGNOSIS — J9601 Acute respiratory failure with hypoxia: Secondary | ICD-10-CM

## 2018-11-07 DIAGNOSIS — E7800 Pure hypercholesterolemia, unspecified: Secondary | ICD-10-CM | POA: Diagnosis not present

## 2018-11-07 DIAGNOSIS — I10 Essential (primary) hypertension: Secondary | ICD-10-CM | POA: Diagnosis present

## 2018-11-07 DIAGNOSIS — K449 Diaphragmatic hernia without obstruction or gangrene: Secondary | ICD-10-CM | POA: Diagnosis present

## 2018-11-07 DIAGNOSIS — I2781 Cor pulmonale (chronic): Secondary | ICD-10-CM | POA: Diagnosis not present

## 2018-11-07 DIAGNOSIS — K5190 Ulcerative colitis, unspecified, without complications: Secondary | ICD-10-CM | POA: Diagnosis present

## 2018-11-07 DIAGNOSIS — K922 Gastrointestinal hemorrhage, unspecified: Secondary | ICD-10-CM

## 2018-11-07 DIAGNOSIS — K562 Volvulus: Secondary | ICD-10-CM

## 2018-11-07 DIAGNOSIS — R571 Hypovolemic shock: Secondary | ICD-10-CM | POA: Diagnosis present

## 2018-11-07 DIAGNOSIS — J9811 Atelectasis: Secondary | ICD-10-CM | POA: Diagnosis present

## 2018-11-07 DIAGNOSIS — I4891 Unspecified atrial fibrillation: Secondary | ICD-10-CM | POA: Diagnosis not present

## 2018-11-07 DIAGNOSIS — I272 Pulmonary hypertension, unspecified: Secondary | ICD-10-CM | POA: Diagnosis present

## 2018-11-07 DIAGNOSIS — I480 Paroxysmal atrial fibrillation: Secondary | ICD-10-CM

## 2018-11-07 DIAGNOSIS — I471 Supraventricular tachycardia: Secondary | ICD-10-CM

## 2018-11-07 DIAGNOSIS — Z01818 Encounter for other preprocedural examination: Secondary | ICD-10-CM

## 2018-11-07 DIAGNOSIS — E785 Hyperlipidemia, unspecified: Secondary | ICD-10-CM | POA: Diagnosis present

## 2018-11-07 DIAGNOSIS — K3189 Other diseases of stomach and duodenum: Secondary | ICD-10-CM

## 2018-11-07 DIAGNOSIS — E876 Hypokalemia: Secondary | ICD-10-CM | POA: Diagnosis present

## 2018-11-07 DIAGNOSIS — K56691 Other complete intestinal obstruction: Secondary | ICD-10-CM | POA: Diagnosis not present

## 2018-11-07 DIAGNOSIS — K859 Acute pancreatitis without necrosis or infection, unspecified: Secondary | ICD-10-CM | POA: Diagnosis present

## 2018-11-07 DIAGNOSIS — I081 Rheumatic disorders of both mitral and tricuspid valves: Secondary | ICD-10-CM | POA: Diagnosis present

## 2018-11-07 DIAGNOSIS — I2729 Other secondary pulmonary hypertension: Secondary | ICD-10-CM | POA: Diagnosis present

## 2018-11-07 DIAGNOSIS — Z9889 Other specified postprocedural states: Secondary | ICD-10-CM

## 2018-11-07 DIAGNOSIS — K5730 Diverticulosis of large intestine without perforation or abscess without bleeding: Secondary | ICD-10-CM | POA: Diagnosis present

## 2018-11-07 DIAGNOSIS — J9690 Respiratory failure, unspecified, unspecified whether with hypoxia or hypercapnia: Secondary | ICD-10-CM

## 2018-11-07 DIAGNOSIS — K56609 Unspecified intestinal obstruction, unspecified as to partial versus complete obstruction: Secondary | ICD-10-CM | POA: Diagnosis present

## 2018-11-07 DIAGNOSIS — E872 Acidosis: Secondary | ICD-10-CM | POA: Diagnosis present

## 2018-11-07 DIAGNOSIS — K2970 Gastritis, unspecified, without bleeding: Secondary | ICD-10-CM | POA: Diagnosis present

## 2018-11-07 HISTORY — DX: Adult hypertrophic pyloric stenosis: K31.1

## 2018-11-07 HISTORY — DX: Diaphragmatic hernia with obstruction, without gangrene: K44.0

## 2018-11-07 HISTORY — DX: Other diseases of stomach and duodenum: K31.89

## 2018-11-07 HISTORY — DX: Other specified postprocedural states: Z98.890

## 2018-11-07 HISTORY — DX: Acute respiratory failure with hypoxia: J96.01

## 2018-11-07 HISTORY — DX: Hematemesis: K92.0

## 2018-11-07 LAB — CBC
HCT: 31.8 % — ABNORMAL LOW (ref 39.0–52.0)
Hemoglobin: 9.9 g/dL — ABNORMAL LOW (ref 13.0–17.0)
MCH: 27.5 pg (ref 26.0–34.0)
MCHC: 31.1 g/dL (ref 30.0–36.0)
MCV: 88.3 fL (ref 80.0–100.0)
Platelets: 240 10*3/uL (ref 150–400)
RBC: 3.6 MIL/uL — AB (ref 4.22–5.81)
RDW: 14.2 % (ref 11.5–15.5)
WBC: 10 10*3/uL (ref 4.0–10.5)
nRBC: 0 % (ref 0.0–0.2)

## 2018-11-07 LAB — POCT I-STAT 7, (LYTES, BLD GAS, ICA,H+H)
Acid-base deficit: 6 mmol/L — ABNORMAL HIGH (ref 0.0–2.0)
Acid-base deficit: 6 mmol/L — ABNORMAL HIGH (ref 0.0–2.0)
Bicarbonate: 23.6 mmol/L (ref 20.0–28.0)
Bicarbonate: 22.3 mmol/L (ref 20.0–28.0)
Calcium, Ion: 1.25 mmol/L (ref 1.15–1.40)
Calcium, Ion: 1.21 mmol/L (ref 1.15–1.40)
HCT: 35 % — ABNORMAL LOW (ref 39.0–52.0)
HCT: 33 % — ABNORMAL LOW (ref 39.0–52.0)
Hemoglobin: 11.9 g/dL — ABNORMAL LOW (ref 13.0–17.0)
Hemoglobin: 11.2 g/dL — ABNORMAL LOW (ref 13.0–17.0)
O2 Saturation: 95 %
O2 Saturation: 95 %
PH ART: 7.223 — AB (ref 7.350–7.450)
Patient temperature: 36.2
Patient temperature: 36
Potassium: 3.8 mmol/L (ref 3.5–5.1)
Potassium: 3.9 mmol/L (ref 3.5–5.1)
Sodium: 139 mmol/L (ref 135–145)
Sodium: 139 mmol/L (ref 135–145)
TCO2: 26 mmol/L (ref 22–32)
TCO2: 24 mmol/L (ref 22–32)
pCO2 arterial: 64.6 mmHg — ABNORMAL HIGH (ref 32.0–48.0)
pCO2 arterial: 53.3 mmHg — ABNORMAL HIGH (ref 32.0–48.0)
pH, Arterial: 7.167 — CL (ref 7.350–7.450)
pO2, Arterial: 96 mmHg (ref 83.0–108.0)
pO2, Arterial: 88 mmHg (ref 83.0–108.0)

## 2018-11-07 LAB — BLOOD GAS, ARTERIAL
ACID-BASE EXCESS: 0.1 mmol/L (ref 0.0–2.0)
BICARBONATE: 22.9 mmol/L (ref 20.0–28.0)
Drawn by: 235321
FIO2: 100
MECHVT: 580 mL
O2 Saturation: 98.7 %
PEEP: 5 cmH2O
Patient temperature: 98.6
RATE: 24 resp/min
pCO2 arterial: 32.3 mmHg (ref 32.0–48.0)
pH, Arterial: 7.465 — ABNORMAL HIGH (ref 7.350–7.450)
pO2, Arterial: 402 mmHg — ABNORMAL HIGH (ref 83.0–108.0)

## 2018-11-07 LAB — TYPE AND SCREEN
ABO/RH(D): A POS
Antibody Screen: NEGATIVE

## 2018-11-07 LAB — LACTIC ACID, PLASMA
LACTIC ACID, VENOUS: 1.9 mmol/L (ref 0.5–1.9)
Lactic Acid, Venous: 1.2 mmol/L (ref 0.5–1.9)

## 2018-11-07 LAB — OCCULT BLOOD GASTRIC / DUODENUM (SPECIMEN CUP)
Occult Blood, Gastric: POSITIVE — AB
pH, Gastric: 4

## 2018-11-07 LAB — ECHOCARDIOGRAM COMPLETE

## 2018-11-07 LAB — TRIGLYCERIDES: Triglycerides: 21 mg/dL (ref <150)

## 2018-11-07 LAB — ABO/RH: ABO/RH(D): A POS

## 2018-11-07 LAB — PROCALCITONIN: Procalcitonin: <0.1 ng/mL

## 2018-11-07 LAB — PROTIME-INR
INR: 0.97
Prothrombin Time: 12.8 seconds (ref 11.4–15.2)

## 2018-11-07 SURGERY — FUNDOPLICATION, NISSEN, ROBOT-ASSISTED, LAPAROSCOPIC
Anesthesia: General | Site: Abdomen

## 2018-11-07 MED ORDER — HYDROCORTISONE 2.5 % RE CREA: 1.0000 "application " | TOPICAL_CREAM | Freq: Four times a day (QID) | RECTAL | Status: DC | PRN

## 2018-11-07 MED ORDER — ALBUMIN HUMAN 5 % IV SOLN
INTRAVENOUS | Status: AC
  Filled 2018-11-07: qty 500

## 2018-11-07 MED ORDER — ROCURONIUM BROMIDE 100 MG/10ML IV SOLN
INTRAVENOUS | Status: AC
  Filled 2018-11-07: qty 1

## 2018-11-07 MED ORDER — PROPOFOL 1000 MG/100ML IV EMUL
0.0000 ug/kg/min | INTRAVENOUS | Status: DC
  Administered 2018-11-07: 5 ug/kg/min via INTRAVENOUS
  Filled 2018-11-07: qty 100

## 2018-11-07 MED ORDER — OXYCODONE-ACETAMINOPHEN 5-325 MG PO TABS
1.0000 | ORAL_TABLET | ORAL | Status: DC | PRN
  Administered 2018-11-07: 1 via ORAL
  Filled 2018-11-07: qty 1

## 2018-11-07 MED ORDER — SODIUM CHLORIDE 0.9 % IV SOLN
INTRAVENOUS | Status: DC | PRN
  Administered 2018-11-07: 16:00:00 via INTRAVENOUS

## 2018-11-07 MED ORDER — ESMOLOL HCL 100 MG/10ML IV SOLN
INTRAVENOUS | Status: DC | PRN
  Administered 2018-11-07: 20 mg via INTRAVENOUS

## 2018-11-07 MED ORDER — ROCURONIUM BROMIDE 50 MG/5ML IV SOSY
PREFILLED_SYRINGE | INTRAVENOUS | Status: DC | PRN
  Administered 2018-11-07: 20 mg via INTRAVENOUS
  Administered 2018-11-07: 50 mg via INTRAVENOUS
  Administered 2018-11-07: 20 mg via INTRAVENOUS

## 2018-11-07 MED ORDER — MIDAZOLAM HCL 2 MG/2ML IJ SOLN: 1.0000 mg | INTRAMUSCULAR | Status: DC | PRN

## 2018-11-07 MED ORDER — ALUM & MAG HYDROXIDE-SIMETH 200-200-20 MG/5ML PO SUSP: 30.0000 mL | Freq: Four times a day (QID) | ORAL | Status: DC | PRN

## 2018-11-07 MED ORDER — ONDANSETRON HCL 4 MG PO TABS: 4.0000 mg | ORAL_TABLET | Freq: Four times a day (QID) | ORAL | Status: DC | PRN

## 2018-11-07 MED ORDER — ESMOLOL HCL 100 MG/10ML IV SOLN
INTRAVENOUS | Status: AC
  Filled 2018-11-07: qty 10

## 2018-11-07 MED ORDER — BUPIVACAINE LIPOSOME 1.3 % IJ SUSP
20.0000 mL | Freq: Once | INTRAMUSCULAR | Status: DC
  Filled 2018-11-07: qty 20

## 2018-11-07 MED ORDER — ONDANSETRON HCL 4 MG/2ML IJ SOLN
4.0000 mg | Freq: Once | INTRAMUSCULAR | Status: AC
  Administered 2018-11-07: 4 mg via INTRAVENOUS
  Filled 2018-11-07: qty 2

## 2018-11-07 MED ORDER — LIDOCAINE 2% (20 MG/ML) 5 ML SYRINGE
INTRAMUSCULAR | Status: AC
  Filled 2018-11-07: qty 5

## 2018-11-07 MED ORDER — ONDANSETRON HCL 4 MG/2ML IJ SOLN
INTRAMUSCULAR | Status: AC
  Administered 2018-11-07: 4 mg via INTRAVENOUS
  Filled 2018-11-07: qty 2

## 2018-11-07 MED ORDER — FENTANYL CITRATE (PF) 100 MCG/2ML IJ SOLN
INTRAMUSCULAR | Status: DC | PRN
  Administered 2018-11-07 (×3): 50 ug via INTRAVENOUS
  Administered 2018-11-07: 100 ug via INTRAVENOUS
  Administered 2018-11-07: 50 ug via INTRAVENOUS

## 2018-11-07 MED ORDER — SODIUM CHLORIDE (PF) 0.9 % IJ SOLN
INTRAMUSCULAR | Status: AC
  Filled 2018-11-07: qty 50

## 2018-11-07 MED ORDER — METOPROLOL TARTRATE 5 MG/5ML IV SOLN: 5.0000 mg | Freq: Four times a day (QID) | INTRAVENOUS | Status: DC | PRN

## 2018-11-07 MED ORDER — HYDROCORTISONE 1 % EX CREA: 1.0000 "application " | TOPICAL_CREAM | Freq: Three times a day (TID) | CUTANEOUS | Status: DC | PRN

## 2018-11-07 MED ORDER — METRONIDAZOLE IN NACL 5-0.79 MG/ML-% IV SOLN
500.0000 mg | Freq: Four times a day (QID) | INTRAVENOUS | Status: AC
  Administered 2018-11-07 – 2018-11-08 (×3): 500 mg via INTRAVENOUS
  Filled 2018-11-07 (×3): qty 100

## 2018-11-07 MED ORDER — HYDROMORPHONE HCL 1 MG/ML IJ SOLN: 0.5000 mg | INTRAMUSCULAR | Status: DC | PRN

## 2018-11-07 MED ORDER — CHLORHEXIDINE GLUCONATE 0.12% ORAL RINSE (MEDLINE KIT)
15.0000 mL | Freq: Two times a day (BID) | OROMUCOSAL | Status: DC
  Administered 2018-11-07 – 2018-11-11 (×5): 15 mL via OROMUCOSAL

## 2018-11-07 MED ORDER — PHENYLEPHRINE 40 MCG/ML (10ML) SYRINGE FOR IV PUSH (FOR BLOOD PRESSURE SUPPORT)
PREFILLED_SYRINGE | INTRAVENOUS | Status: AC
  Filled 2018-11-07: qty 10

## 2018-11-07 MED ORDER — HYDRALAZINE HCL 20 MG/ML IJ SOLN
10.0000 mg | Freq: Four times a day (QID) | INTRAMUSCULAR | Status: DC | PRN
  Administered 2018-11-07 – 2018-11-09 (×2): 10 mg via INTRAVENOUS
  Filled 2018-11-07 (×2): qty 1

## 2018-11-07 MED ORDER — DIPHENHYDRAMINE HCL 50 MG/ML IJ SOLN: 12.5000 mg | Freq: Four times a day (QID) | INTRAMUSCULAR | Status: DC | PRN

## 2018-11-07 MED ORDER — BUPIVACAINE-EPINEPHRINE 0.25% -1:200000 IJ SOLN
INTRAMUSCULAR | Status: DC | PRN
  Administered 2018-11-07: 60 mL

## 2018-11-07 MED ORDER — DEXTROSE-NACL 5-0.9 % IV SOLN
INTRAVENOUS | Status: DC
  Administered 2018-11-07 – 2018-11-10 (×3): via INTRAVENOUS

## 2018-11-07 MED ORDER — LACTATED RINGERS IV BOLUS
1000.0000 mL | Freq: Once | INTRAVENOUS | Status: AC
  Administered 2018-11-07: 1000 mL via INTRAVENOUS

## 2018-11-07 MED ORDER — LIDOCAINE 2% (20 MG/ML) 5 ML SYRINGE
INTRAMUSCULAR | Status: DC | PRN
  Administered 2018-11-07: 100 mg via INTRAVENOUS

## 2018-11-07 MED ORDER — FENTANYL CITRATE (PF) 250 MCG/5ML IJ SOLN
INTRAMUSCULAR | Status: AC
  Filled 2018-11-07: qty 5

## 2018-11-07 MED ORDER — HYDROMORPHONE HCL 1 MG/ML IJ SOLN
0.5000 mg | INTRAMUSCULAR | Status: DC | PRN
  Administered 2018-11-07 (×2): 0.5 mg via INTRAVENOUS
  Filled 2018-11-07 (×2): qty 1

## 2018-11-07 MED ORDER — LIDOCAINE 2% (20 MG/ML) 5 ML SYRINGE
INTRAMUSCULAR | Status: DC | PRN
  Administered 2018-11-07: 1 mg/kg/h via INTRAVENOUS

## 2018-11-07 MED ORDER — SUCCINYLCHOLINE CHLORIDE 200 MG/10ML IV SOSY
PREFILLED_SYRINGE | INTRAVENOUS | Status: DC | PRN
  Administered 2018-11-07: 80 mg via INTRAVENOUS

## 2018-11-07 MED ORDER — SODIUM CHLORIDE 0.9 % IV SOLN
8.0000 mg/h | INTRAVENOUS | Status: DC
  Administered 2018-11-07: 8 mg/h via INTRAVENOUS
  Filled 2018-11-07 (×4): qty 80

## 2018-11-07 MED ORDER — HYDROCODONE-ACETAMINOPHEN 5-325 MG PO TABS: 1.0000 | ORAL_TABLET | ORAL | Status: DC | PRN

## 2018-11-07 MED ORDER — LACTATED RINGERS IV BOLUS: 1000.0000 mL | Freq: Three times a day (TID) | INTRAVENOUS | Status: DC | PRN

## 2018-11-07 MED ORDER — BUPIVACAINE-EPINEPHRINE (PF) 0.25% -1:200000 IJ SOLN
INTRAMUSCULAR | Status: AC
  Filled 2018-11-07: qty 30

## 2018-11-07 MED ORDER — PHENYLEPHRINE 40 MCG/ML (10ML) SYRINGE FOR IV PUSH (FOR BLOOD PRESSURE SUPPORT)
PREFILLED_SYRINGE | INTRAVENOUS | Status: DC | PRN
  Administered 2018-11-07: 80 ug via INTRAVENOUS
  Administered 2018-11-07: 120 ug via INTRAVENOUS
  Administered 2018-11-07 (×2): 80 ug via INTRAVENOUS
  Administered 2018-11-07 (×2): 120 ug via INTRAVENOUS

## 2018-11-07 MED ORDER — PROPOFOL 10 MG/ML IV BOLUS
INTRAVENOUS | Status: DC | PRN
  Administered 2018-11-07: 100 mg via INTRAVENOUS
  Administered 2018-11-07 (×2): 50 mg via INTRAVENOUS

## 2018-11-07 MED ORDER — PHENYLEPHRINE 40 MCG/ML (10ML) SYRINGE FOR IV PUSH (FOR BLOOD PRESSURE SUPPORT)
PREFILLED_SYRINGE | INTRAVENOUS | Status: AC
  Filled 2018-11-07: qty 20

## 2018-11-07 MED ORDER — ALBUMIN HUMAN 5 % IV SOLN
INTRAVENOUS | Status: DC | PRN
  Administered 2018-11-07 (×2): via INTRAVENOUS

## 2018-11-07 MED ORDER — PROPOFOL 10 MG/ML IV BOLUS
INTRAVENOUS | Status: AC
  Filled 2018-11-07: qty 20

## 2018-11-07 MED ORDER — LACTATED RINGERS IR SOLN
Status: DC | PRN
  Administered 2018-11-07: 1000 mL

## 2018-11-07 MED ORDER — DEXAMETHASONE SODIUM PHOSPHATE 10 MG/ML IJ SOLN
INTRAMUSCULAR | Status: AC
  Filled 2018-11-07: qty 1

## 2018-11-07 MED ORDER — ACETAMINOPHEN 325 MG PO TABS: 650.0000 mg | ORAL_TABLET | Freq: Four times a day (QID) | ORAL | Status: DC | PRN

## 2018-11-07 MED ORDER — FENTANYL CITRATE (PF) 100 MCG/2ML IJ SOLN
INTRAMUSCULAR | Status: AC
  Filled 2018-11-07: qty 2

## 2018-11-07 MED ORDER — ONDANSETRON HCL 4 MG/2ML IJ SOLN: 4.0000 mg | Freq: Four times a day (QID) | INTRAMUSCULAR | Status: DC | PRN

## 2018-11-07 MED ORDER — LIDOCAINE 2% (20 MG/ML) 5 ML SYRINGE
INTRAMUSCULAR | Status: AC
  Filled 2018-11-07: qty 10

## 2018-11-07 MED ORDER — VASOPRESSIN 20 UNIT/ML IV SOLN
INTRAVENOUS | Status: DC | PRN
  Administered 2018-11-07: 1 [IU] via INTRAVENOUS

## 2018-11-07 MED ORDER — EPHEDRINE SULFATE-NACL 50-0.9 MG/10ML-% IV SOSY
PREFILLED_SYRINGE | INTRAVENOUS | Status: DC | PRN
  Administered 2018-11-07 (×3): 10 mg via INTRAVENOUS

## 2018-11-07 MED ORDER — GUAIFENESIN-DM 100-10 MG/5ML PO SYRP: 10.0000 mL | ORAL_SOLUTION | ORAL | Status: DC | PRN

## 2018-11-07 MED ORDER — HYDROMORPHONE HCL 1 MG/ML IJ SOLN
INTRAMUSCULAR | Status: AC
  Filled 2018-11-07: qty 1

## 2018-11-07 MED ORDER — MENTHOL 3 MG MT LOZG: 1.0000 | LOZENGE | OROMUCOSAL | Status: DC | PRN

## 2018-11-07 MED ORDER — HYDROMORPHONE HCL 1 MG/ML IJ SOLN
0.5000 mg | Freq: Once | INTRAMUSCULAR | Status: AC
  Administered 2018-11-07: 0.5 mg via INTRAVENOUS
  Filled 2018-11-07: qty 1

## 2018-11-07 MED ORDER — MIDAZOLAM HCL 2 MG/2ML IJ SOLN
INTRAMUSCULAR | Status: AC
  Administered 2018-11-07: 2 mg
  Filled 2018-11-07: qty 2

## 2018-11-07 MED ORDER — SODIUM CHLORIDE 0.9 % IV BOLUS
500.0000 mL | Freq: Once | INTRAVENOUS | Status: AC
  Administered 2018-11-07: 500 mL via INTRAVENOUS

## 2018-11-07 MED ORDER — MAGIC MOUTHWASH
15.0000 mL | Freq: Four times a day (QID) | ORAL | Status: DC | PRN
  Filled 2018-11-07: qty 15

## 2018-11-07 MED ORDER — SODIUM CHLORIDE 0.9 % IV SOLN
2.0000 g | INTRAVENOUS | Status: AC
  Administered 2018-11-07: 2 g via INTRAVENOUS
  Filled 2018-11-07: qty 20

## 2018-11-07 MED ORDER — PROCHLORPERAZINE EDISYLATE 10 MG/2ML IJ SOLN: 5.0000 mg | INTRAMUSCULAR | Status: DC | PRN

## 2018-11-07 MED ORDER — 0.9 % SODIUM CHLORIDE (POUR BTL) OPTIME
TOPICAL | Status: DC | PRN
  Administered 2018-11-07: 2000 mL

## 2018-11-07 MED ORDER — ONDANSETRON HCL 4 MG/2ML IJ SOLN
INTRAMUSCULAR | Status: AC
  Filled 2018-11-07: qty 2

## 2018-11-07 MED ORDER — DEXAMETHASONE SODIUM PHOSPHATE 10 MG/ML IJ SOLN
INTRAMUSCULAR | Status: DC | PRN
  Administered 2018-11-07: 7 mg via INTRAVENOUS

## 2018-11-07 MED ORDER — ONDANSETRON HCL 4 MG/2ML IJ SOLN
4.0000 mg | Freq: Once | INTRAMUSCULAR | Status: AC
  Administered 2018-11-07: 4 mg via INTRAVENOUS

## 2018-11-07 MED ORDER — ORAL CARE MOUTH RINSE
15.0000 mL | OROMUCOSAL | Status: DC
  Administered 2018-11-07 – 2018-11-08 (×7): 15 mL via OROMUCOSAL

## 2018-11-07 MED ORDER — LACTATED RINGERS IV SOLN
INTRAVENOUS | Status: DC
  Administered 2018-11-07: 1000 mL via INTRAVENOUS
  Administered 2018-11-07: 16:00:00 via INTRAVENOUS

## 2018-11-07 MED ORDER — PANTOPRAZOLE SODIUM 40 MG IV SOLR
40.0000 mg | Freq: Two times a day (BID) | INTRAVENOUS | Status: DC
Start: 2018-11-10 — End: 2018-11-12
  Administered 2018-11-10 – 2018-11-12 (×4): 40 mg via INTRAVENOUS
  Filled 2018-11-07 (×5): qty 40

## 2018-11-07 MED ORDER — PROPOFOL 1000 MG/100ML IV EMUL
INTRAVENOUS | Status: AC
  Filled 2018-11-07: qty 100

## 2018-11-07 MED ORDER — SODIUM CHLORIDE 0.9 % IV SOLN
8.0000 mg | Freq: Four times a day (QID) | INTRAVENOUS | Status: DC | PRN
  Filled 2018-11-07: qty 4

## 2018-11-07 MED ORDER — BISACODYL 10 MG RE SUPP: 10.0000 mg | Freq: Two times a day (BID) | RECTAL | Status: DC | PRN

## 2018-11-07 MED ORDER — PHENYLEPHRINE HCL 10 MG/ML IJ SOLN
INTRAMUSCULAR | Status: AC
  Filled 2018-11-07: qty 2

## 2018-11-07 MED ORDER — FENTANYL 2500MCG IN NS 250ML (10MCG/ML) PREMIX INFUSION
0.0000 ug/h | INTRAVENOUS | Status: DC
  Administered 2018-11-07: 25 ug/h via INTRAVENOUS
  Administered 2018-11-07: 200 ug/h via INTRAVENOUS
  Filled 2018-11-07: qty 250

## 2018-11-07 MED ORDER — SODIUM CHLORIDE 0.9 % IV SOLN
INTRAVENOUS | Status: DC | PRN
  Administered 2018-11-07: 50 ug/min via INTRAVENOUS

## 2018-11-07 MED ORDER — PHENOL 1.4 % MT LIQD: 1.0000 | OROMUCOSAL | Status: DC | PRN

## 2018-11-07 MED ORDER — HYDROMORPHONE HCL 1 MG/ML IJ SOLN
0.5000 mg | Freq: Once | INTRAMUSCULAR | Status: AC
  Administered 2018-11-07: 0.5 mg via INTRAVENOUS

## 2018-11-07 MED ORDER — ACETAMINOPHEN 650 MG RE SUPP: 650.0000 mg | Freq: Four times a day (QID) | RECTAL | Status: DC | PRN

## 2018-11-07 MED ORDER — ONDANSETRON HCL 4 MG/2ML IJ SOLN
INTRAMUSCULAR | Status: DC | PRN
  Administered 2018-11-07: 4 mg via INTRAVENOUS

## 2018-11-07 MED ORDER — MORPHINE SULFATE (PF) 4 MG/ML IV SOLN
4.0000 mg | Freq: Once | INTRAVENOUS | Status: AC
Start: 2018-11-07 — End: 2018-11-07
  Administered 2018-11-07: 4 mg via INTRAVENOUS
  Filled 2018-11-07: qty 1

## 2018-11-07 MED ORDER — PANTOPRAZOLE SODIUM 40 MG IV SOLR
80.0000 mg | Freq: Once | INTRAVENOUS | Status: AC
  Administered 2018-11-07: 80 mg via INTRAVENOUS
  Filled 2018-11-07: qty 80

## 2018-11-07 MED ORDER — METOPROLOL TARTRATE 5 MG/5ML IV SOLN
5.0000 mg | Freq: Three times a day (TID) | INTRAVENOUS | Status: DC
  Administered 2018-11-07: 5 mg via INTRAVENOUS
  Filled 2018-11-07: qty 5

## 2018-11-07 MED ORDER — METRONIDAZOLE IN NACL 5-0.79 MG/ML-% IV SOLN
500.0000 mg | INTRAVENOUS | Status: AC
  Administered 2018-11-07: 500 mg via INTRAVENOUS
  Filled 2018-11-07: qty 100

## 2018-11-07 MED ORDER — LIP MEDEX EX OINT
1.0000 "application " | TOPICAL_OINTMENT | Freq: Two times a day (BID) | CUTANEOUS | Status: DC
  Administered 2018-11-07 – 2018-11-12 (×10): 1 via TOPICAL
  Filled 2018-11-07 (×2): qty 7

## 2018-11-07 MED ORDER — IOPAMIDOL (ISOVUE-300) INJECTION 61%
100.0000 mL | Freq: Once | INTRAVENOUS | Status: AC | PRN
  Administered 2018-11-07: 100 mL via INTRAVENOUS

## 2018-11-07 MED ORDER — IOPAMIDOL (ISOVUE-300) INJECTION 61%
INTRAVENOUS | Status: AC
  Filled 2018-11-07: qty 100

## 2018-11-07 SURGICAL SUPPLY — 72 items
APPLIER CLIP 5 13 M/L LIGAMAX5 (MISCELLANEOUS)
APPLIER CLIP ROT 10 11.4 M/L (STAPLE)
APR CLP MED LRG 11.4X10 (STAPLE)
APR CLP MED LRG 5 ANG JAW (MISCELLANEOUS)
BLADE SURG SZ11 CARB STEEL (BLADE) ×3 IMPLANT
CHLORAPREP W/TINT 26ML (MISCELLANEOUS) ×3 IMPLANT
CLIP APPLIE 5 13 M/L LIGAMAX5 (MISCELLANEOUS) IMPLANT
CLIP APPLIE ROT 10 11.4 M/L (STAPLE) IMPLANT
COVER SURGICAL LIGHT HANDLE (MISCELLANEOUS) ×3 IMPLANT
COVER TIP SHEARS 8 DVNC (MISCELLANEOUS) IMPLANT
COVER TIP SHEARS 8MM DA VINCI (MISCELLANEOUS) ×2
COVER WAND RF STERILE (DRAPES) ×3 IMPLANT
DRAIN CHANNEL 19F RND (DRAIN) ×2 IMPLANT
DRAIN PENROSE 18X1/2 LTX STRL (DRAIN) IMPLANT
DRAPE ARM DVNC X/XI (DISPOSABLE) ×4 IMPLANT
DRAPE COLUMN DVNC XI (DISPOSABLE) ×1 IMPLANT
DRAPE DA VINCI XI ARM (DISPOSABLE) ×8
DRAPE DA VINCI XI COLUMN (DISPOSABLE) ×2
DRAPE WARM FLUID 44X44 (DRAPE) ×3 IMPLANT
DRSG TEGADERM 2-3/8X2-3/4 SM (GAUZE/BANDAGES/DRESSINGS) ×15 IMPLANT
ELECT REM PT RETURN 15FT ADLT (MISCELLANEOUS) ×3 IMPLANT
ENDOLOOP SUT PDS II  0 18 (SUTURE)
ENDOLOOP SUT PDS II 0 18 (SUTURE) IMPLANT
EVACUATOR SILICONE 100CC (DRAIN) ×2 IMPLANT
FELT TEFLON 4 X1 (Mesh General) ×2 IMPLANT
GAUZE SPONGE 2X2 8PLY STRL LF (GAUZE/BANDAGES/DRESSINGS) ×1 IMPLANT
GLOVE BIOGEL PI IND STRL 6 (GLOVE) IMPLANT
GLOVE BIOGEL PI IND STRL 6.5 (GLOVE) IMPLANT
GLOVE BIOGEL PI INDICATOR 6 (GLOVE) ×2
GLOVE BIOGEL PI INDICATOR 6.5 (GLOVE) ×2
GLOVE ECLIPSE 6.5 STRL STRAW (GLOVE) ×2 IMPLANT
GLOVE ECLIPSE 8.0 STRL XLNG CF (GLOVE) ×6 IMPLANT
GLOVE INDICATOR 8.0 STRL GRN (GLOVE) ×6 IMPLANT
GLOVE SURG SS PI 6.0 STRL IVOR (GLOVE) ×2 IMPLANT
GOWN STRL REUS W/ TWL LRG LVL3 (GOWN DISPOSABLE) IMPLANT
GOWN STRL REUS W/TWL LRG LVL3 (GOWN DISPOSABLE) ×3
GOWN STRL REUS W/TWL XL LVL3 (GOWN DISPOSABLE) ×11 IMPLANT
IRRIG SUCT STRYKERFLOW 2 WTIP (MISCELLANEOUS) ×3
IRRIGATION SUCT STRKRFLW 2 WTP (MISCELLANEOUS) ×1 IMPLANT
KIT BASIN OR (CUSTOM PROCEDURE TRAY) ×3 IMPLANT
KIT PRC NS LF DISP ENDO (KITS) IMPLANT
KIT PROCEDURE OLYMPUS (KITS) ×3
NEEDLE HYPO 22GX1.5 SAFETY (NEEDLE) ×3 IMPLANT
PACK CARDIOVASCULAR III (CUSTOM PROCEDURE TRAY) ×3 IMPLANT
PAD POSITIONING PINK XL (MISCELLANEOUS) ×3 IMPLANT
SCISSORS LAP 5X45 EPIX DISP (ENDOMECHANICALS) IMPLANT
SEAL CANN UNIV 5-8 DVNC XI (MISCELLANEOUS) ×4 IMPLANT
SEAL XI 5MM-8MM UNIVERSAL (MISCELLANEOUS) ×8
SEALER VESSEL DA VINCI XI (MISCELLANEOUS) ×2
SEALER VESSEL EXT DVNC XI (MISCELLANEOUS) ×1 IMPLANT
SOLUTION ELECTROLUBE (MISCELLANEOUS) ×3 IMPLANT
SPONGE GAUZE 2X2 STER 10/PKG (GAUZE/BANDAGES/DRESSINGS) ×2
SPONGE LAP 18X18 RF (DISPOSABLE) ×3 IMPLANT
SUT ETHIBOND 0 36 GRN (SUTURE) ×8 IMPLANT
SUT ETHIBOND NAB CT1 #1 30IN (SUTURE) ×6 IMPLANT
SUT MNCRL AB 4-0 PS2 18 (SUTURE) ×5 IMPLANT
SUT PROLENE 2 0 SH DA (SUTURE) IMPLANT
SUT V-LOC BARB 180 2/0GR6 GS22 (SUTURE)
SUTURE V-LC BRB 180 2/0GR6GS22 (SUTURE) IMPLANT
SYR 10ML LL (SYRINGE) ×3 IMPLANT
SYR 20CC LL (SYRINGE) ×3 IMPLANT
SYRINGE IRR TOOMEY STRL 70CC (SYRINGE) ×2 IMPLANT
TIP INNERVISION DETACH 40FR (MISCELLANEOUS) IMPLANT
TIP INNERVISION DETACH 50FR (MISCELLANEOUS) IMPLANT
TIP INNERVISION DETACH 56FR (MISCELLANEOUS) IMPLANT
TIPS INNERVISION DETACH 40FR (MISCELLANEOUS)
TOWEL OR 17X26 10 PK STRL BLUE (TOWEL DISPOSABLE) ×3 IMPLANT
TOWEL OR NON WOVEN STRL DISP B (DISPOSABLE) ×3 IMPLANT
TRAY FOLEY MTR SLVR 16FR STAT (SET/KITS/TRAYS/PACK) ×2 IMPLANT
TROCAR ADV FIXATION 5X100MM (TROCAR) ×3 IMPLANT
TUBING ENDO SMARTCAP (MISCELLANEOUS) ×2 IMPLANT
TUBING INSUFFLATION 10FT LAP (TUBING) ×3 IMPLANT

## 2018-11-07 NOTE — Progress Notes (Signed)
  Echocardiogram 2D Echocardiogram has been performed.  Daniel Dalton L Androw 11/07/2018, 12:24 PM

## 2018-11-07 NOTE — Progress Notes (Signed)
Patient placed on vent at [place]; ABG order in place.

## 2018-11-07 NOTE — Anesthesia Procedure Notes (Addendum)
Procedure Name: Intubation Date/Time: 11/07/2018 2:42 PM Performed by: West Pugh, CRNA Pre-anesthesia Checklist: Patient identified, Emergency Drugs available, Suction available, Patient being monitored and Timeout performed Patient Re-evaluated:Patient Re-evaluated prior to induction Oxygen Delivery Method: Circle system utilized Preoxygenation: Pre-oxygenation with 100% oxygen Induction Type: IV induction, Rapid sequence and Cricoid Pressure applied Laryngoscope Size: 3 and Glidescope Grade View: Grade I Tube type: Subglottic suction tube Tube size: 7.5 mm Number of attempts: 1 Airway Equipment and Method: Video-laryngoscopy and Rigid stylet Placement Confirmation: ETT inserted through vocal cords under direct vision,  positive ETCO2,  CO2 detector and breath sounds checked- equal and bilateral Secured at: 23 cm Tube secured with: Tape Dental Injury: Teeth and Oropharynx as per pre-operative assessment

## 2018-11-07 NOTE — Consult Note (Signed)
NAME:  Daniel Dalton., MRN:  [id number], DOB:  06-Feb-1941, LOS: 0 ADMISSION DATE:  11/07/2018, CONSULTATION DATE: 11/07/2018 REFERRING MD: Dr. Johney Maine, CCS, CHIEF COMPLAINT: Postop respiratory failure  Brief History   77 with paroxysmal A. fib, hypertension, sleep apnea and ulcerative colitis on sulfasalazine, known large hiatal hernia.  Admitted with abdominal pain, hematemesis, poor p.o. intake.  Continue to have pain after suction decompression.  Went for laparoscopic/robotic repair 1/29.  Noted to have a respiratory acidosis at the end of the case.  Remains intubated.  He had hypotension at induction, suspected hypovolemia due to his GI losses.  To the ICU for further care  History of present illness   78 year old former smoker (remote) with atrial fibrillation, ulcerative colitis, hypertension, obstructive sleep apnea on CPAP, known hiatal hernia with volvulus.  Admitted with abdominal pain, hematemesis, poor p.o. intake.  Taken to the OR for laparotomy, found to have edematous boggy stomach with gastric outlet obstruction.  Improved after the volvulus was reduced, hiatal hernia repaired, posterior fundoplication.  Evolved shock in the operating room, phenylephrine initiated with stabilization.  Found to have respiratory acidosis at the end of the case, return to the ICU intubated  Significant Hospital Events   To the ICU intubated 1/29  Consults:  General surgery Cardiology  Procedures:  1/29 hiatal hernia repair, mediastinal dissection, anterior and posterior gastropexy and posterior fundoplication under general anesthesia  Significant Diagnostic Tests:  CT chest abdomen pelvis 1/29 >> hiatal hernia with majority the stomach in the chest cavity, distended with fluid that suggests partial gastric outlet obstruction, organoaxial volvulus (chronic)  Micro Data:  None  Antimicrobials:  Ceftriaxone 1/29 x 1 Flagyl 1/29  >> 1/30  Interim history/subjective:    Objective   Blood  pressure (!) 157/97, pulse (!) 120, temperature 98.4 F (36.9 C), temperature source Axillary, resp. rate 18, height 5' 10"  (1.778 m), weight 86.2 kg, SpO2 92 %.        Intake/Output Summary (Last 24 hours) at 11/07/2018 1832 Last data filed at 11/07/2018 1748 Gross per 24 hour  Intake 3600 ml  Output 650 ml  Net 2950 ml   Filed Weights   11/06/18 2148 11/07/18 1323  Weight: 86.2 kg 86.2 kg    Examination: General: ill appearing man, ventilated HENT: ET tube in place, oropharynx clear, pupils equal Lungs: Coarse bilaterally, no wheezing Cardiovascular: Tachycardic, irregular, no murmur Abdomen: Port sites dressed, clean.  Mediastinal drain Extremities: No edema Neuro: Starting to wake up, agitated, moving in the bed, moving all extremities, not following commands  Resolved Hospital Problem list     Assessment & Plan:  Acute respiratory failure with respiratory acidosis PRVC 8 cc/kg, wean FiO2 Follow ABG Chest x-ray now VAP prevention orders  Shock, presumed hypovolemic.  No evidence to support infection currently.  He did receive perioperative antibiotics Wean phenylephrine, consider IVF bolus given presumed hypovolemia due to emesis, poor PO preoperatively Send cultures if patient spikes a fever, develops leukocytosis  Laparoscopic hiatal hernia repair NGT to suction N.p.o. Change protonix gtt to bid, discussed with Dr. Johney Maine Plans per CCS  Ulcerative colitis Sulfasalazine on hold  Atrial fibrillation Hold scheduled metoprolol until we confirm hemodynamic stability, likely restart when blood pressure stabilizing  Hypertension Telmisartan on hold  Encephalopathy, need for sedation PAD protocol: Sedation fent gtt + prn versed   Best practice:  Diet: N.p.o. Pain/Anxiety/Delirium protocol (if indicated): Fentanyl drip, Versed as needed VAP protocol (if indicated): Ordered DVT prophylaxis: SCD GI prophylaxis: PPI  q12h Glucose control: NA Mobility:  Bedrest Code Status: Full code Family Communication: None present at bedside.  Family was updated by Dr. Johney Maine postop on 1/29 Disposition: ICU  Labs   CBC: Recent Labs  Lab 11/06/18 2159  WBC 8.5  HGB 12.5*  HCT 38.9*  MCV 87.6  PLT 165    Basic Metabolic Panel: Recent Labs  Lab 11/06/18 2159  NA 140  K 4.3  CL 105  CO2 24  GLUCOSE 130*  BUN 20  CREATININE 0.82  CALCIUM 9.6   GFR: Estimated Creatinine Clearance: 75.4 mL/min (by C-G formula based on SCr of 0.82 mg/dL). Recent Labs  Lab 11/06/18 2159 11/07/18 1132  WBC 8.5  --   LATICACIDVEN  --  1.2    Liver Function Tests: Recent Labs  Lab 11/06/18 2159  AST 19  ALT 14  ALKPHOS 76  BILITOT 0.9  PROT 8.1  ALBUMIN 4.7   Recent Labs  Lab 11/06/18 2159  LIPASE 37   No results for input(s): AMMONIA in the last 168 hours.  ABG No results found for: PHART, PCO2ART, PO2ART, HCO3, TCO2, ACIDBASEDEF, O2SAT   Coagulation Profile: Recent Labs  Lab 11/07/18 0517  INR 0.97    Cardiac Enzymes: No results for input(s): CKTOTAL, CKMB, CKMBINDEX, TROPONINI in the last 168 hours.  HbA1C: No results found for: HGBA1C  CBG: No results for input(s): GLUCAP in the last 168 hours.  Review of Systems:   Unable to obtain  Past Medical History  He,  has a past medical history of Arrhythmia, Arthritis, Colitis, Family history of anesthesia complication, Heart murmur, History of kidney stones, Hypertension, Paroxysmal atrial fibrillation ([place]), Prostate cancer ([place]), Sleep apnea, and Umbilical hernia.   Surgical History    Past Surgical History:  Procedure Laterality Date  . ANTERIOR CERVICAL DECOMP/DISCECTOMY FUSION N/A 11/21/2013   Procedure: Cervical four-five, Cervical five-six, Cervical six-seven anterior cervical decompression with fusion plating and bonegraft;  Surgeon: Hosie Spangle, MD;  Location: [facility];  Service: Neurosurgery;  Laterality: N/A;  Cervical four-five, Cervical five-six,  Cervical six-seven anterior cervical decompression with fusion plating and bonegraft  . BACK SURGERY    . LEG SURGERY Right   . NASAL SINUS SURGERY    . PROSTATECTOMY       Social History   reports that he quit smoking about 49 years ago. His smoking use included cigarettes. He has a 7.50 pack-year smoking history. He has quit using smokeless tobacco.  His smokeless tobacco use included chew. He reports that he does not drink alcohol or use drugs.   Family History   His family history includes Anesthesia problems in his mother; Dementia (age of onset: 33) in his mother; Hypertension (age of onset: 68) in his sister; Hypertension (age of onset: 39) in his brother; Hypothyroidism in his sister; Parkinsonism (age of onset: 38) in his father.   Allergies Allergies  Allergen Reactions  . Fluorescein Rash     Home Medications  Prior to Admission medications   Medication Sig Start Date End Date Taking? Authorizing Provider  aspirin EC 81 MG tablet Take 1 tablet (81 mg total) by mouth daily. 02/13/13  Yes Larey Dresser, MD  metoprolol tartrate (LOPRESSOR) 25 MG tablet Take 1 tablet (25 mg total) by mouth 2 (two) times daily. (BETA BLOCKER) 09/25/18  Yes Burtis Junes, NP  sulfaSALAzine (AZULFIDINE) 500 MG tablet Take 2,000 mg by mouth 2 (two) times daily.    Yes [provider]  telmisartan (  MICARDIS) 80 MG tablet Take 80 mg by mouth daily.  08/07/18  Yes [provider]     Critical care time: 40 minutes     Baltazar Apo, MD, PhD 11/07/2018, 6:56 PM [facility] [phone] or if no answer [phone]

## 2018-11-07 NOTE — Anesthesia Postprocedure Evaluation (Signed)
Anesthesia Post Note  Patient: Kaian Fahs.  Procedure(s) Performed: XI ROBOTIC ASSISTED REDUCTION AND REPAIR OF HIATAL HERNIA. TOUPET (PARTIAL POSTERIOR FUNDOPLICATION) , EGD (N/A Abdomen)     Patient location during evaluation: ICU Anesthesia Type: General Level of consciousness: sedated and patient remains intubated per anesthesia plan Pain management: pain level controlled Vital Signs Assessment: post-procedure vital signs reviewed and stable Respiratory status: patient remains intubated per anesthesia plan Cardiovascular status: stable Postop Assessment: no apparent nausea or vomiting Anesthetic complications: no    Last Vitals:  Vitals:   11/07/18 1313 11/07/18 1838  BP: (!) 157/97   Pulse: (!) 120   Resp: 18   Temp: 36.9 C   SpO2: 92% 100%                  Audry Pili

## 2018-11-07 NOTE — Transfer of Care (Signed)
Immediate Anesthesia Transfer of Care Note  Patient: Daniel Dalton.  Procedure(s) Performed: XI ROBOTIC ASSISTED REDUCTION AND REPAIR OF HIATAL HERNIA. TOUPET (PARTIAL POSTERIOR FUNDOPLICATION) , EGD (N/A Abdomen)  Patient Location: PACU  Anesthesia Type:General  Level of Consciousness: Patient remains intubated per anesthesia plan  Airway & Oxygen Therapy: Patient remains intubated per anesthesia plan  Post-op Assessment: Report given to RN and Post -op Vital signs reviewed and stable  Post vital signs: Reviewed and stable  Last Vitals:  Vitals Value Taken Time  BP    Temp    Pulse 51 11/07/2018  6:44 PM  Resp 22 11/07/2018  6:44 PM  SpO2 76 % 11/07/2018  6:44 PM  Vitals shown include unvalidated device data.  Last Pain:  Vitals:   11/07/18 1323  TempSrc:   PainSc: 9       Patients Stated Pain Goal: 4 (44/92/01 0071)  Complications: No apparent anesthesia complications

## 2018-11-07 NOTE — H&P (View-Only) (Signed)
Daniel Dalton. is an 78 y.o. male.   Chief Complaint: abdominal pain Consult request Dr. Rudolpho Sevin  HPI: Patient is a 78 year old male who presented to the ED with abdominal pain.  He has a history of prior hiatal hernia left inguinal hernia repairs.  He complains of generalized diffuse pain that was severe nature.  He was last hospitalized on 12/25/2016, with a large hiatal hernia.  He had several weeks of worsening pain prior to that  admission.  CT showed a large hiatal hernia possibly a paraesophageal hernia but no signs of ischemia or obstruction.  He was admitted and placed bowel rest and symptoms resolved.  He was discharged 48 hours later and was instructed to follow-up with Dr. Ralene Ok.  Since discharge in 2018 he has had two episodes of pain that did not last very long.  Pain always comes after eating, and last less than 30 minutes before resolving.  Last p.m. he started having pain after eating around 6 PM.  Pain was 9 out of 10, somewhat better now, after pain medicines, but still has some pain with the pain medication.  Work-up in the ED shows he is afebrile but tachycardic.  Blood pressure is elevated.  CMP on admission was essentially normal except for a glucose of 130, WBC 8.5, hemoglobin 12.5, hematocrit 38.9, INR 0.97.  Urinalysis is negative.  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  Occult gastric was positive.  CT of the chest done this a.m. shows stable appearing hiatal hernia dating back to 2018 the majority stomach lies within the chest cavity stomach remains distended with fluid and may be related to partial gastric outlet obstruction.  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  The majority of the stomach is distended the gastric antrum is at the gastro esophageal junction and the gastroesophageal junction as above field-of-view partial obstruction may be present in the due to compression of the  downward herniated gastric body and gastric volvulus is also possible.  He has a history hypertension, proximal atrial fibrillation,(he is refused anticoagulation) history of ulcerative colitis hx of prostate cancer, and obstructive sleep apnea.   We are asked to see.      Past Medical History:  Diagnosis Date  . Arrhythmia   . Arthritis   . Colitis   . Family history of anesthesia complication    mother post- op NV  . Heart murmur   . History of kidney stones   . Hypertension   . Paroxysmal atrial fibrillation (HCC)    history of  . Prostate cancer ([place])    in remission  . Sleep apnea    uses CPAP  . Umbilical hernia          Past Surgical History:  Procedure Laterality Date  . ANTERIOR CERVICAL DECOMP/DISCECTOMY FUSION N/A 11/21/2013   Procedure: Cervical four-five, Cervical five-six, Cervical six-seven anterior cervical decompression with fusion plating and bonegraft;  Surgeon: Hosie Spangle, MD;  Location: [facility];  Service: Neurosurgery;  Laterality: N/A;  Cervical four-five, Cervical five-six, Cervical six-seven anterior cervical decompression with fusion plating and bonegraft  . BACK SURGERY    . LEG SURGERY Right   . NASAL SINUS SURGERY    . PROSTATECTOMY           Family History  Problem Relation Age of Onset  . Parkinsonism Father 75       deceased  . Dementia Mother 26  deceased  . Anesthesia problems Mother   . Hypertension Brother 42       alive  . Hypertension Sister 31       alive  . Hypothyroidism Sister    Social History:  reports that he quit smoking about 49 years ago. His smoking use included cigarettes. He has a 7.50 pack-year smoking history. He has quit using smokeless tobacco.  His smokeless tobacco use included chew. He reports that he does not drink alcohol or use drugs.  Allergies:      Allergies  Allergen Reactions  . Fluorescein Rash           Prior to Admission medications    Medication Sig Start Date End Date Taking? Authorizing Provider  aspirin EC 81 MG tablet Take 1 tablet (81 mg total) by mouth daily. 02/13/13  Yes Larey Dresser, MD  metoprolol tartrate (LOPRESSOR) 25 MG tablet Take 1 tablet (25 mg total) by mouth 2 (two) times daily. (BETA BLOCKER) 09/25/18  Yes Burtis Junes, NP  sulfaSALAzine (AZULFIDINE) 500 MG tablet Take 2,000 mg by mouth 2 (two) times daily.    Yes [provider]  telmisartan (MICARDIS) 80 MG tablet Take 80 mg by mouth daily.  08/07/18  Yes [provider]      LabResultsLast48Hours        Results for orders placed or performed during the hospital encounter of 11/07/18 (from the past 48 hour(s))  Lipase, blood     Status: None   Collection Time: 11/06/18  9:59 PM  Result Value Ref Range   Lipase 37 11 - 51 U/L    Comment: Performed at [facility], [place] [address], [place], [place] [address]  Comprehensive metabolic panel     Status: Abnormal   Collection Time: 11/06/18  9:59 PM  Result Value Ref Range   Sodium 140 135 - 145 mmol/L   Potassium 4.3 3.5 - 5.1 mmol/L   Chloride 105 98 - 111 mmol/L   CO2 24 22 - 32 mmol/L   Glucose, Bld 130 (H) 70 - 99 mg/dL   BUN 20 8 - 23 mg/dL   Creatinine, Ser 0.82 0.61 - 1.24 mg/dL   Calcium 9.6 8.9 - 10.3 mg/dL   Total Protein 8.1 6.5 - 8.1 g/dL   Albumin 4.7 3.5 - 5.0 g/dL   AST 19 15 - 41 U/L   ALT 14 0 - 44 U/L   Alkaline Phosphatase 76 38 - 126 U/L   Total Bilirubin 0.9 0.3 - 1.2 mg/dL   GFR calc non Af Amer >60 >60 mL/min   GFR calc Af Amer >60 >60 mL/min   Anion gap 11 5 - 15    Comment: Performed at [facility], [place] [address], [place],  [address]  CBC     Status: Abnormal   Collection Time: 11/06/18  9:59 PM  Result Value Ref Range   WBC 8.5 4.0 - 10.5 K/uL   RBC 4.44 4.22 - 5.81 MIL/uL   Hemoglobin 12.5 (L) 13.0 - 17.0 g/dL   HCT 38.9 (L) 39.0 - 52.0 %   MCV  87.6 80.0 - 100.0 fL   MCH 28.2 26.0 - 34.0 pg   MCHC 32.1 30.0 - 36.0 g/dL   RDW 14.1 11.5 - 15.5 %   Platelets 296 150 - 400 K/uL   nRBC 0.0 0.0 - 0.2 %    Comment: Performed at [facility], [place] [address] [place], [place]  [address]  Urinalysis, Routine w reflex microscopic     Status: Abnormal   Collection Time: 11/06/18 11:07 PM  Result Value Ref Range   Color, Urine AMBER (A) YELLOW    Comment: BIOCHEMICALS MAY BE AFFECTED BY COLOR   APPearance CLEAR CLEAR   Specific Gravity, Urine 1.024 1.005 - 1.030   pH 5.0 5.0 - 8.0   Glucose, UA NEGATIVE NEGATIVE mg/dL   Hgb urine dipstick NEGATIVE NEGATIVE   Bilirubin Urine NEGATIVE NEGATIVE   Ketones, ur 20 (A) NEGATIVE mg/dL   Protein, ur 100 (A) NEGATIVE mg/dL   Nitrite NEGATIVE NEGATIVE   Leukocytes, UA NEGATIVE NEGATIVE   RBC / HPF 0-5 0 - 5 RBC/hpf   WBC, UA 0-5 0 - 5 WBC/hpf   Bacteria, UA NONE SEEN NONE SEEN   Squamous Epithelial / LPF 0-5 0 - 5   Mucus PRESENT    Hyaline Casts, UA PRESENT     Comment: Performed at [facility], [place] [address] [place], [place] [address]  Occult bld gastric/duodenum (cup to lab)     Status: Abnormal   Collection Time: 11/07/18  4:44 AM  Result Value Ref Range   pH, Gastric 4    Occult Blood, Gastric POSITIVE (A) NEGATIVE    Comment: Performed at [facility], [place] [address] [place], [place] [address]  Type and screen     Status: None   Collection Time: 11/07/18  5:17 AM  Result Value Ref Range   ABO/RH(D) A POS    Antibody Screen NEG    Sample Expiration      11/10/2018 Performed at [facility], [place] [address] [place], [place] [address]   Protime-INR     Status: None   Collection Time: 11/07/18  5:17 AM  Result Value Ref Range   Prothrombin Time 12.8 11.4 - 15.2 seconds   INR 0.97     Comment: Performed at [facility], [place]  [address], [place], [place] [address]  ABO/Rh     Status: None   Collection Time: 11/07/18  5:17 AM  Result Value Ref Range   ABO/RH(D)      A POS Performed at [facility], [place] [address], [place], [place] [address]       ImagingResults(Last48hours)  Ct Abdomen Pelvis W Contrast  Result Date: 11/07/2018 CLINICAL DATA:  78 y/o M; generalized abdominal pain, nausea, and vomiting. EXAM: CT ABDOMEN AND PELVIS WITH CONTRAST TECHNIQUE: Multidetector CT imaging of the abdomen and pelvis was performed using the standard protocol following bolus administration of intravenous contrast. CONTRAST:  11m ISOVUE-300 IOPAMIDOL (ISOVUE-300) INJECTION 61% COMPARISON:  05/11/2017 CT abdomen and pelvis. FINDINGS: Lower chest: No acute abnormality. Hepatobiliary: No focal liver abnormality is seen. No gallstones, gallbladder wall thickening, or biliary dilatation. Pancreas: Unremarkable. No pancreatic ductal dilatation or surrounding inflammatory changes. Spleen: Normal in size without focal abnormality. Adrenals/Urinary Tract: Stable left adrenal nodule when measured in the coronal plane and 23 mm and on the prior study in similar fashion (series 4, image 63). Right kidney lower pole 5 mm nonobstructing stone. Multiple small stable renal cyst. No hydronephrosis or ureter stone. Normal bladder. Stomach/Bowel: Large hiatal hernia containing the majority of the stomach. Stomach is distended and fluid-filled with a portion of the gastric body below the diaphragm. The gastric antrum is at the gastroesophageal junction and the gastroesophageal junction is above the field of view. Appendix appears normal. No evidence of bowel wall thickening, distention, or  inflammatory changes. Sigmoid diverticulosis without findings of acute diverticulitis. Vascular/Lymphatic: Aortic atherosclerosis. No enlarged abdominal or pelvic lymph nodes. Reproductive: Prostate is unremarkable. Other: No abdominal wall  hernia or abnormality. No abdominopelvic ascites. Musculoskeletal: No fracture is seen. Multilevel degenerative changes of the lumbar spine and mild-to-moderate bilateral hip osteoarthrosis. IMPRESSION: 1. Incompletely visualized large hiatal hernia containing the majority of the stomach which is distended. The gastric antrum is at the gastroesophageal junction and the gastroesophageal junction is above the field of view. Partial obstruction may be present either due to compression of the downward herniated gastric body and gastric volvulus is also possible. 2. Right kidney lower pole nonobstructing stone. 3. Sigmoid diverticulosis without findings of acute diverticulitis. Electronically Signed   By: Kristine Garbe M.D.   On: 11/07/2018 05:45     Review of Systems  Constitutional: Negative.   HENT: Negative.   Eyes: Negative.   Respiratory: Negative.        He has sleep apnea but cannot use the CPAP machine and has a dental device to help with this.  Cardiovascular: Negative.        He has a history of atrial fibrillation but says on the medications he has been stable and no arrhythmias.  Gastrointestinal: Positive for abdominal pain, constipation (He has recently had some issues with constipation.), nausea and vomiting. Negative for blood in stool, heartburn and melena.       Nausea vomiting abdominal pain started around 6 PM last evening and was acute.  This is the worst pain he has had with this.  Genitourinary: Negative.   Musculoskeletal: Negative.   Skin: Negative.   Neurological: Negative.   Endo/Heme/Allergies: Negative.   Psychiatric/Behavioral: Negative.     Blood pressure (!) 166/100, pulse (!) 111, temperature 97.7 F (36.5 C), temperature source Oral, resp. rate 19, height 5' 10"  (1.778 m), weight 86.2 kg, SpO2 96 %. Physical Exam  Constitutional: He is oriented to person, place, and time. He appears well-developed and well-nourished. No distress.  HENT:  Head:  Normocephalic and atraumatic.  Mouth/Throat: No oropharyngeal exudate.  Mouth is dry  Eyes: Right eye exhibits no discharge. Left eye exhibits no discharge. No scleral icterus.  Neck: Normal range of motion. Neck supple. No JVD present. No tracheal deviation present. No thyromegaly present.  Cardiovascular: Regular rhythm, normal heart sounds and intact distal pulses.  No murmur heard. Tachycardic, telemetry shows sinus tach rate is around 115.  Blood pressure is 164/100.  Respiratory: Effort normal and breath sounds normal. No respiratory distress. He has no wheezes. He has no rales. He exhibits no tenderness.  GI: Soft. He exhibits no distension and no mass. There is abdominal tenderness. There is no rebound and no guarding.  He has tenderness of pain in the mid abdominal portion of his abdomen.  He has a well-healed midline scar from prior surgery which he says is the prostatectomy. Still has a bit of a left femoral hernia  Musculoskeletal:        General: Edema (Trace on the right lower extremity) present. No tenderness.  Lymphadenopathy:    He has no cervical adenopathy.  Neurological: He is alert and oriented to person, place, and time. No cranial nerve deficit.  Skin: Skin is warm and dry. No rash noted. He is not diaphoretic. No erythema. No pallor.  Psychiatric: He has a normal mood and affect. His behavior is normal. Judgment and thought content normal.     Assessment/Plan Abdominal pain with hiatal hernia; majority of the  stomach is in the chest with organoaxial volvulus, stable from last exam Hx ulcerative colitis PAF -not on anticoagulation Hypertension Hx prostate cancer with prostatectomy Hx anterior cervical discectomy Sleep apnea -dental device  Plan: Plan to admit the patient to Medicine.  Place an NG for decompression of the stomach.  IV hydration. During his last admission he improved within 48 hours of admission to the ED.  We will check a lactate and observe  and follow with you.  Heatherly Stenner, PA-C 11/07/2018, 7:09 AM

## 2018-11-07 NOTE — ED Notes (Signed)
Surgery at bedside. Per Surgeon, pull NG tube back about 1-2 inches. NG tube pulled back. Pt tolerated well.

## 2018-11-07 NOTE — Anesthesia Preprocedure Evaluation (Addendum)
Anesthesia Evaluation  Patient identified by MRN, date of birth, ID band Patient awake    Reviewed: Allergy & Precautions, NPO status , Patient's Chart, lab work & pertinent test results  Airway Mallampati: III  TM Distance: <3 FB Neck ROM: Full   Comment: Ng tube present Dental no notable dental hx. (+) Teeth Intact, Dental Advisory Given   Pulmonary sleep apnea and Continuous Positive Airway Pressure Ventilation , former smoker,    Pulmonary exam normal breath sounds clear to auscultation       Cardiovascular hypertension, Pt. on medications and Pt. on home beta blockers Normal cardiovascular exam+ dysrhythmias Atrial Fibrillation  Rhythm:Regular Rate:Normal  11/06/2018 EKG ST R 115   Prelim echo: Ef 60-65%    Neuro/Psych negative neurological ROS     GI/Hepatic Hx of Ulcerative colitis    Endo/Other    Renal/GU    Hx of Prostate Ca    Musculoskeletal   Abdominal   Peds  Hematology Hgb 12.5  T&S   Anesthesia Other Findings   Reproductive/Obstetrics                            Anesthesia Physical Anesthesia Plan  ASA: IV and emergent  Anesthesia Plan: General   Post-op Pain Management:    Induction: Intravenous, Cricoid pressure planned and Rapid sequence  PONV Risk Score and Plan: 3 and Treatment may vary due to age or medical condition, Ondansetron and Dexamethasone  Airway Management Planned: Oral ETT  Additional Equipment: Arterial line  Intra-op Plan:   Post-operative Plan: Extubation in OR and Possible Post-op intubation/ventilation  Informed Consent: I have reviewed the patients History and Physical, chart, labs and discussed the procedure including the risks, benefits and alternatives for the proposed anesthesia with the patient or authorized representative who has indicated his/her understanding and acceptance.     Dental advisory given  Plan Discussed with:  CRNA  Anesthesia Plan Comments:        Anesthesia Quick Evaluation

## 2018-11-07 NOTE — ED Provider Notes (Signed)
TIME SEEN: 3:27 AM  CHIEF COMPLAINT: Abdominal pain  HPI: Patient is a 78 year old male with history of paroxysmal atrial fibrillation, hypertension, previous hiatal hernia, left inguinal hernia repair who presents to the emergency department diffuse generalized abdominal pain that started yesterday.  Patient states pain is severe in nature.  No nausea, vomiting or diarrhea.  No fever.  No dysuria or hematuria.  ROS: See HPI Constitutional: no fever  Eyes: no drainage  ENT: no runny nose   Cardiovascular:  no chest pain  Resp: no SOB  GI: no vomiting GU: no dysuria Integumentary: no rash  Allergy: no hives  Musculoskeletal: no leg swelling  Neurological: no slurred speech ROS otherwise negative  PAST MEDICAL HISTORY/PAST SURGICAL HISTORY:  Past Medical History:  Diagnosis Date  . Arrhythmia   . Arthritis   . Colitis   . Family history of anesthesia complication    mother post- op NV  . Heart murmur   . History of kidney stones   . Hypertension   . Paroxysmal atrial fibrillation (HCC)    history of  . Prostate cancer ([place])    in remission  . Sleep apnea    uses CPAP  . Umbilical hernia     MEDICATIONS:  Prior to Admission medications   Medication Sig Start Date End Date Taking? Authorizing Provider  aspirin EC 81 MG tablet Take 1 tablet (81 mg total) by mouth daily. 02/13/13   Larey Dresser, MD  metoprolol tartrate (LOPRESSOR) 25 MG tablet Take 1 tablet (25 mg total) by mouth 2 (two) times daily. (BETA BLOCKER) 09/25/18   Burtis Junes, NP  sulfaSALAzine (AZULFIDINE) 500 MG tablet Take 2,000 mg by mouth 2 (two) times daily.     [provider]  telmisartan (MICARDIS) 80 MG tablet Take 80 mg by mouth daily.  08/07/18   [provider]    ALLERGIES:  Allergies  Allergen Reactions  . Fluorescein Rash    SOCIAL HISTORY:  Social History   Tobacco Use  . Smoking status: Former Smoker    Packs/day: 0.50    Years: 15.00    Pack years: 7.50    Types: Cigarettes    Last attempt to quit: 10/10/1969    Years since quitting: 49.1  . Smokeless tobacco: Former Systems developer    Types: Chew  Substance Use Topics  . Alcohol use: No    FAMILY HISTORY: Family History  Problem Relation Age of Onset  . Parkinsonism Father 51       deceased  . Dementia Mother 64       deceased  . Anesthesia problems Mother   . Hypertension Brother 32       alive  . Hypertension Sister 21       alive  . Hypothyroidism Sister     EXAM: BP (!) 180/91   Pulse 98   Temp 97.7 F (36.5 C) (Oral)   Resp 18   Ht 5' 10"  (1.778 m)   Wt 86.2 kg   SpO2 94%   BMI 27.26 kg/m  CONSTITUTIONAL: Alert and oriented and responds appropriately to questions.  Elderly, appears uncomfortable HEAD: Normocephalic EYES: Conjunctivae clear, pupils appear equal, EOMI ENT: normal nose; moist mucous membranes NECK: Supple, no meningismus, no nuchal rigidity, no LAD  CARD: RRR; S1 and S2 appreciated; no murmurs, no clicks, no rubs, no gallops RESP: Normal chest excursion without splinting or tachypnea; breath sounds clear and equal bilaterally; no wheezes, no rhonchi, no rales, no hypoxia or respiratory  distress, speaking full sentences ABD/GI: Normal bowel sounds; non-distended; soft, tender diffusely throughout the abdomen, no rebound, no guarding, no peritoneal signs, no hepatosplenomegaly BACK:  The back appears normal and is non-tender to palpation, there is no CVA tenderness EXT: Normal ROM in all joints; non-tender to palpation; no edema; normal capillary refill; no cyanosis, no calf tenderness or swelling    SKIN: Normal color for age and race; warm; no rash NEURO: Moves all extremities equally PSYCH: The patient's mood and manner are appropriate. Grooming and personal hygiene are appropriate.  MEDICAL DECISION MAKING: Patient here with abdominal pain.  Differential includes bowel obstruction, colitis, diverticulitis, GERD, worsening hiatal hernia, appendicitis,  cholecystitis.  Labs, urine reassuring.  Will give pain and nausea medicine.  Will obtain CT of the abdomen pelvis.  ED PROGRESS: Patient began to vomit dark brown vomit that is occult positive.  Will start IV Protonix.  He states he is on aspirin but no anticoagulants.  Has had history of hiatal hernia with volvulus and gastric outlet obstruction.  CT scan is pending.   6:00 AM  Pt's CT scan shows an incompletely visualized large hiatal hernia containing majority of the stomach which is distended.  Gastric antrum is at the gastroesophageal junction and the gastroesophageal junction is above the field-of-view.  Partial obstruction may be present either due to compression of the downward herniated gastric body and gastric volvulus is also possible.  It appears he had similar presentation in 2018 and was admitted to the general surgery service.  At that time symptoms resolved on their own prior to needing surgical intervention.  Will discuss with general surgery again today.  Given he is also having upper GI bleed, patient may be more appropriate to be admitted to the medicine service.  Will discuss with hospitalist on-call as well.  His gastroenterologist is Dr. Watt Climes.  6:10 AM  D/w Dr. Donne Hazel with general surgery.  They will see patient in consultation.  He feels that patient is likely sloughing his mucosa which is causing his upper GI bleed.  Recommends on holding off on NG tube at this time.  He agrees with medicine admission.  Recommends obtaining a CT of the chest without contrast to further evaluate the hiatal hernia.   6:21 AM Discussed patient's case with hospitalist, Dr. Hal Hope.  I have recommended admission and patient (and family if present) agree with this plan. Admitting physician will place admission orders.   I reviewed all nursing notes, vitals, pertinent previous records, EKGs, lab and urine results, imaging (as available).  6:30 AM Pt's HR now in the 110s-130s.  He is  hypertensive.  Reports his pain has been well controlled with Dilaudid.  No further vomiting blood in the ED.  EKG shows sinus tachycardia.  Hospitalist and general surgery aware.  Patient appears more comfortable but vital signs worsening.  Patient and family at bedside updated with plan.    CRITICAL CARE Performed by: Pryor Curia   Total critical care time: 65 minutes  Critical care time was exclusive of separately billable procedures and treating other patients.  Critical care was necessary to treat or prevent imminent or life-threatening deterioration.  Critical care was time spent personally by me on the following activities: development of treatment plan with patient and/or surrogate as well as nursing, discussions with consultants, evaluation of patient's response to treatment, examination of patient, obtaining history from patient or surrogate, ordering and performing treatments and interventions, ordering and review of laboratory studies, ordering and review of radiographic  studies, pulse oximetry and re-evaluation of patient's condition.    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

## 2018-11-07 NOTE — ED Notes (Signed)
General surgery paged at this time to update on status of Chest xray

## 2018-11-07 NOTE — Discharge Instructions (Signed)
EATING AFTER YOUR ESOPHAGEAL SURGERY (Stomach Fundoplication, Hiatal Hernia repair, Achalasia surgery, etc)  ######################################################################  EAT Start with a pureed / full liquid diet (see below) Gradually transition to a high fiber diet with a fiber supplement over the next month after discharge.    WALK Walk an hour a day.  Control your pain to do that.    CONTROL PAIN Control pain so that you can walk, sleep, tolerate sneezing/coughing, go up/down stairs.  HAVE A BOWEL MOVEMENT DAILY Keep your bowels regular to avoid problems.  OK to try a laxative to override constipation.  OK to use an antidairrheal to slow down diarrhea.  Call if not better after 2 tries  CALL IF YOU HAVE PROBLEMS/CONCERNS Call if you are still struggling despite following these instructions. Call if you have concerns not answered by these instructions  ######################################################################   After your esophageal surgery, expect some sticking with swallowing over the next 1-2 months.    If food sticks when you eat, it is called "dysphagia".  This is due to swelling around your esophagus at the wrap & hiatal diaphragm repair.  It will gradually ease off over the next few months.  To help you through this temporary phase, we start you out on a pureed (blenderized) diet.  Your first meal in the hospital was thin liquids.  You should have been given a pureed diet by the time you left the hospital.  We ask patients to stay on a pureed diet for the first 2-3 weeks to avoid anything getting "stuck" near your recent surgery.  Don't be alarmed if your ability to swallow doesn't progress according to this plan.  Everyone is different and some diets can advance more or less quickly.     Some BASIC RULES to follow are:  Maintain an upright position whenever eating or drinking.  Take small bites - just a teaspoon size bite at a time.  Eat slowly.   It may also help to eat only one food at a time.  Consider nibbling through smaller, more frequent meals & avoid the urge to eat BIG meals  Do not push through feelings of fullness, nausea, or bloatedness  Do not mix solid foods and liquids in the same mouthful  Try not to "wash foods down" with large gulps of liquids.  Avoid carbonated (bubbly/fizzy) drinks.    Avoid foods that make you feel gassy or bloated.  Start with bland foods first.  Wait on trying greasy, fried, or spicy meals until you are tolerating more bland solids well.  Understand that it will be hard to burp and belch at first.  This gradually improves with time.  Expect to be more gassy/flatulent/bloated initially.  Walking will help your body manage it better.  Consider using medications for bloating that contain simethicone such as  Maalox or Gas-X   Eat in a relaxed atmosphere & minimize distractions.  Avoid talking while eating.    Do not use straws.  Following each meal, sit in an upright position (90 degree angle) for 60 to 90 minutes.  Going for a short walk can help as well  If food does stick, don't panic.  Try to relax and let the food pass on its own.  Sipping WARM LIQUID such as strong hot black tea can also help slide it down.   Be gradual in changes & use common sense:  -If you easily tolerating a certain "level" of foods, advance to the next level gradually -If you are  having trouble swallowing a particular food, then avoid it.   -If food is sticking when you advance your diet, go back to thinner previous diet (the lower LEVEL) for 1-2 days.  LEVEL 1 = PUREED DIET  Do for the first 2 WEEKS AFTER SURGERY  -Foods in this group are pureed or blenderized to a smooth, mashed potato-like consistency.  -If necessary, the pureed foods can keep their shape with the addition of a thickening agent.   -Meat should be pureed to a smooth, pasty consistency.  Hot broth or gravy may be added to the pureed  meat, approximately 1 oz. of liquid per 3 oz. serving of meat. -CAUTION:  If any foods do not puree into a smooth consistency, swallowing will be more difficult.  (For example, nuts or seeds sometimes do not blend well.)  Hot Foods Cold Foods  Pureed scrambled eggs and cheese Pureed cottage cheese  Baby cereals Thickened juices and nectars  Thinned cooked cereals (no lumps) Thickened milk or eggnog  Pureed [place] toast or pancakes Ensure  Mashed potatoes Ice cream  Pureed parsley, au gratin, scalloped potatoes, candied sweet potatoes Fruit or [place] ice, sherbet  Pureed buttered or alfredo noodles Plain yogurt  Pureed vegetables (no corn or peas) Instant breakfast  Pureed soups and creamed soups Smooth pudding, mousse, custard  Pureed scalloped apples Whipped gelatin  Gravies Sugar, syrup, honey, jelly  Sauces, cheese, tomato, barbecue, white, creamed Cream  Any baby food Creamer  Alcohol in moderation (not beer or champagne) Margarine  Coffee or tea Mayonnaise   Ketchup, mustard   Apple sauce   SAMPLE MENU:  PUREED DIET Breakfast Lunch Dinner   Orange juice, 1/2 cup  Cream of wheat, 1/2 cup  Pineapple juice, 1/2 cup  Pureed [place], barley soup, 3/4 cup  Pureed Hawaiian chicken, 3 oz   Scrambled eggs, mashed or blended with cheese, 1/2 cup  Tea or coffee, 1 cup   Whole milk, 1 cup   Non-dairy creamer, 2 Tbsp.  Mashed potatoes, 1/2 cup  Pureed cooled broccoli, 1/2 cup  Apple sauce, 1/2 cup  Coffee or tea  Mashed potatoes, 1/2 cup  Pureed spinach, 1/2 cup  Frozen yogurt, 1/2 cup  Tea or coffee      LEVEL 2 = SOFT DIET  After your first 2 weeks, you can advance to a soft diet.   Keep on this diet until everything goes down easily.  Hot Foods Cold Foods  White fish Cottage cheese  Stuffed fish Junior baby fruit  Baby food meals Semi thickened juices  Minced soft cooked, scrambled, poached eggs nectars  Souffle & omelets Ripe mashed bananas  Cooked  cereals Canned fruit, pineapple sauce, milk  potatoes Milkshake  Buttered or Alfredo noodles Custard  Cooked cooled vegetable Puddings, including tapioca  Sherbet Yogurt  Vegetable soup or alphabet soup Fruit ice, [place] ice  Gravies Whipped gelatin  Sugar, syrup, honey, jelly Junior baby desserts  Sauces:  Cheese, creamed, barbecue, tomato, white Cream  Coffee or tea Margarine   SAMPLE MENU:  LEVEL 2 Breakfast Lunch Dinner   Orange juice, 1/2 cup  Oatmeal, 1/2 cup  Scrambled eggs with cheese, 1/2 cup  Decaffeinated tea, 1 cup  Whole milk, 1 cup  Non-dairy creamer, 2 Tbsp  Pineapple juice, 1/2 cup  Minced beef, 3 oz  Gravy, 2 Tbsp  Mashed potatoes, 1/2 cup  Minced fresh broccoli, 1/2 cup  Applesauce, 1/2 cup  Coffee, 1 cup  [place], barley soup, 3/4 cup  Minced Hawaiian chicken, 3 oz  Mashed potatoes, 1/2 cup  Cooked spinach, 1/2 cup  Frozen yogurt, 1/2 cup  Non-dairy creamer, 2 Tbsp      LEVEL 3 = CHOPPED DIET  -After all the foods in level 2 (soft diet) are passing through well you should advance up to more chopped foods.  -It is still important to cut these foods into small pieces and eat slowly.  Hot Foods Cold Foods  Poultry Cottage cheese  Chopped Swedish meatballs Yogurt  Meat salads (ground or flaked meat) Milk  Flaked fish (tuna) Milkshakes  Poached or scrambled eggs Soft, cold, dry cereal  Souffles and omelets Fruit juices or nectars  Cooked cereals Chopped canned fruit  Chopped [place] toast or pancakes Canned fruit cocktail  Noodles or pasta (no rice) Pudding, mousse, custard  Cooked vegetables (no frozen peas, corn, or mixed vegetables) Green salad  Canned small sweet peas Ice cream  Creamed soup or vegetable soup Fruit ice, [place] ice  Pureed vegetable soup or alphabet soup Non-dairy creamer  Ground scalloped apples Margarine  Gravies Mayonnaise  Sauces:  Cheese, creamed, barbecue, tomato, white Ketchup  Coffee or tea Mustard    SAMPLE MENU:  LEVEL 3 Breakfast Lunch Dinner   Orange juice, 1/2 cup  Oatmeal, 1/2 cup  Scrambled eggs with cheese, 1/2 cup  Decaffeinated tea, 1 cup  Whole milk, 1 cup  Non-dairy creamer, 2 Tbsp  Ketchup, 1 Tbsp  Margarine, 1 tsp  Salt, 1/4 tsp  Sugar, 2 tsp  Pineapple juice, 1/2 cup  Ground beef, 3 oz  Gravy, 2 Tbsp  Mashed potatoes, 1/2 cup  Cooked spinach, 1/2 cup  Applesauce, 1/2 cup  Decaffeinated coffee  Whole milk  Non-dairy creamer, 2 Tbsp  Margarine, 1 tsp  Salt, 1/4 tsp  Pureed [place], barley soup, 3/4 cup  Barbecue chicken, 3 oz  Mashed potatoes, 1/2 cup  Ground fresh broccoli, 1/2 cup  Frozen yogurt, 1/2 cup  Decaffeinated tea, 1 cup  Non-dairy creamer, 2 Tbsp  Margarine, 1 tsp  Salt, 1/4 tsp  Sugar, 1 tsp    LEVEL 4:  REGULAR FOODS  -Foods in this group are soft, moist, regularly textured foods.   -This level includes meat and breads, which tend to be the hardest things to swallow.   -Eat very slowly, chew well and continue to avoid carbonated drinks. -most people are at this level in 4-6 weeks  Hot Foods Cold Foods  Baked fish or skinned Soft cheeses - cottage cheese  Souffles and omelets Cream cheese  Eggs Yogurt  Stuffed shells Milk  Spaghetti with meat sauce Milkshakes  Cooked cereal Cold dry cereals (no nuts, dried fruit, coconut)  [place] toast or pancakes Crackers  Buttered toast Fruit juices or nectars  Noodles or pasta (no rice) Canned fruit  Potatoes (all types) Ripe bananas  Soft, cooked vegetables (no corn, lima, or baked beans) Peeled, ripe, fresh fruit  Creamed soups or vegetable soup Cakes (no nuts, dried fruit, coconut)  Canned chicken noodle soup Plain doughnuts  Gravies Ice cream  Bacon dressing Pudding, mousse, custard  Sauces:  Cheese, creamed, barbecue, tomato, white Fruit ice, [place] ice, sherbet  Decaffeinated tea or coffee Whipped gelatin  Pork chops Regular gelatin   Canned fruited  gelatin molds   Sugar, syrup, honey, jam, jelly   Cream   Non-dairy   Margarine   Oil   Mayonnaise   Ketchup   Mustard   TROUBLESHOOTING IRREGULAR BOWELS  1) Avoid extremes of bowel  movements (no bad constipation/diarrhea)  2) Miralax 17gm mixed in [place]. water or juice-daily. May use BID as needed.  3) Gas-x,Phazyme, etc. as needed for gas & bloating.  4) Soft,bland diet. No spicy,greasy,fried foods.  5) Prilosec over-the-counter as needed  6) May hold gluten/wheat products from diet to see if symptoms improve.  7) May try probiotics (Align, Activa, etc) to help calm the bowels down  7) If symptoms become worse call back immediately.    If you have any questions please call our office at [place]: [phone].   LAPAROSCOPIC SURGERY: POST OP INSTRUCTIONS  ######################################################################  EAT Gradually transition to a high fiber diet with a fiber supplement over the next few weeks after discharge.  Start with a pureed / full liquid diet (see below)  WALK Walk an hour a day.  Control your pain to do that.    CONTROL PAIN Control pain so that you can walk, sleep, tolerate sneezing/coughing, go up/down stairs.  HAVE A BOWEL MOVEMENT DAILY Keep your bowels regular to avoid problems.  OK to try a laxative to override constipation.  OK to use an antidairrheal to slow down diarrhea.  Call if not better after 2 tries  CALL IF YOU HAVE PROBLEMS/CONCERNS Call if you are still struggling despite following these instructions. Call if you have concerns not answered by these instructions  ######################################################################    1. DIET: Follow a light bland diet the first 24 hours after arrival home, such as soup, liquids, crackers, etc.  Be sure to include lots of fluids daily.  Avoid fast food or heavy meals as your are more likely to get nauseated.  Eat a low fat the next few days after  surgery.    2. Take your usually prescribed home medications unless otherwise directed.  3. PAIN CONTROL: a. Pain is best controlled by a usual combination of three different methods TOGETHER: i. Ice/Heat ii. Over the counter pain medication iii. Prescription pain medication b. Most patients will experience some swelling and bruising around the incisions.  Ice packs or heating pads (30-60 minutes up to 6 times a day) will help. Use ice for the first few days to help decrease swelling and bruising, then switch to heat to help relax tight/sore spots and speed recovery.  Some people prefer to use ice alone, heat alone, alternating between ice & heat.  Experiment to what works for you.  Swelling and bruising can take several weeks to resolve.   c. It is helpful to take an over-the-counter pain medication regularly for the first few weeks.  Choose one of the following that works best for you: i. Naproxen (Aleve, etc)  Two 298m tabs twice a day ii. Ibuprofen (Advil, etc) Three 2027mtabs four times a day (every meal & bedtime) iii. Acetaminophen (Tylenol, etc) 500-65046mour times a day (every meal & bedtime) d. A  prescription for pain medication (such as oxycodone, hydrocodone, tramadol, gabapentin, methocarbamol, etc) should be given to you upon discharge.  Take your pain medication as prescribed.  i. If you are having problems/concerns with the prescription medicine (does not control pain, nausea, vomiting, rash, itching, etc), please call us [phone] see if we need to switch you to a different pain medicine that will work better for you and/or control your side effect better. ii. If you need a refill on your pain medication, please give us [place] hour notice.  contact your pharmacy.  They will contact our office to request authorization. Prescriptions will  not be filled after 5 pm or on week-ends  4. Avoid getting constipated.   a. Between the surgery and the pain medications, it is common to  experience some constipation.   b. Increasing fluid intake and taking a fiber supplement (such as Metamucil, Citrucel, FiberCon, MiraLax, etc) 1-2 times a day regularly will usually help prevent this problem from occurring.   c. A mild laxative (prune juice, Milk of Magnesia, MiraLax, etc) should be taken according to package directions if there are no bowel movements after 48 hours.   5. Watch out for diarrhea.   a. If you have many loose bowel movements, simplify your diet to bland foods & liquids for a few days.   b. Stop any stool softeners and decrease your fiber supplement.   c. Switching to mild anti-diarrheal medications (Kayopectate, Pepto Bismol) can help.   d. If this worsens or does not improve, please call us.  6. Wash / shower every day.  You may shower over the dressings as they are waterproof.  Continue to shower over incision(s) after the dressing is off.  7. Remove your waterproof bandages 5 days after surgery.  You may leave the incision open to air.  You may replace a dressing/Band-Aid to cover the incision for comfort if you wish.   8. ACTIVITIES as tolerated:   a. You may resume regular (light) daily activities beginning the next day--such as daily self-care, walking, climbing stairs--gradually increasing activities as tolerated.  If you can walk 30 minutes without difficulty, it is safe to try more intense activity such as jogging, treadmill, bicycling, low-impact aerobics, swimming, etc. b. Save the most intensive and strenuous activity for last such as sit-ups, heavy lifting, contact sports, etc  Refrain from any heavy lifting or straining until you are off narcotics for pain control.   c. DO NOT PUSH THROUGH PAIN.  Let pain be your guide: If it hurts to do something, don't do it.  Pain is your body warning you to avoid that activity for another week until the pain goes down. d. You may drive when you are no longer taking prescription pain medication, you can comfortably  wear a seatbelt, and you can safely maneuver your car and apply brakes. e. Dennis Bast may have sexual intercourse when it is comfortable.  9. FOLLOW UP in our office a. Please call [facility] at [phone] to set up an appointment to see your surgeon in the office for a follow-up appointment approximately 2-3 weeks after your surgery. b. Make sure that you call for this appointment the day you arrive home to insure a convenient appointment time.  10. IF YOU HAVE DISABILITY OR FAMILY LEAVE FORMS, BRING THEM TO THE OFFICE FOR PROCESSING.  DO NOT GIVE THEM TO YOUR DOCTOR.   WHEN TO CALL us [phone]: 1. Poor pain control 2. Reactions / problems with new medications (rash/itching, nausea, etc)  3. Fever over 101.5 F (38.5 C) 4. Inability to urinate 5. Nausea and/or vomiting 6. Worsening swelling or bruising 7. Continued bleeding from incision. 8. Increased pain, redness, or drainage from the incision   The clinic staff is available to answer your questions during regular business hours (8:30am-5pm).  Please dont hesitate to call and ask to speak to one of our nurses for clinical concerns.   If you have a medical emergency, go to the nearest emergency room or call 911.  A surgeon from [facility] is always on call at the hospitals   [facility]  [facility], [place], [place], [place], [place]  [address] ? MAIN: [phone] ? TOLL FREE: [phone] ?  FAX [phone] [email or web address]

## 2018-11-07 NOTE — Op Note (Addendum)
11/07/2018  5:56 PM  PATIENT:  Daniel Dalton.  78 y.o. 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 ROBOTIC reduction of paraesophageal hiatal hernia 2. Type II mediastinal dissection. 3. Primary repair of hiatal hernia over pledgets.  4. Anterior & posterior gastropexy. 5. Toupet (partial 240 degree posterior) fundoplication x 3.5 cm 6. EGD  SURGEON:  Adin Hector, MD  ASSIST:  Carlena Hurl, PA-C  ANESTHESIA:   local and general  EBL:  Total I/O In: 3664 [I.V.:2500; IV Piggyback:750] Out: 650 [Urine:650] EBL 61m   Delay start of Pharmacological VTE agent (>24hrs) due to surgical blood loss or risk of bleeding:  no  ANESTHESIA: 1. General anesthesia. 2. Local anesthetic in a field block around all port sites.  SPECIMEN:  Mediastinal hernia sac (not sent).  DRAINS:  A 19-French Blake drain goes from the right upper quadrant along the lesser curvature of the stomach into the mediastinum.  COUNTS:  YES  PLAN OF CARE: Admit to inpatient   PATIENT DISPOSITION:  ICU - intubated and hemodynamically stable.  INDICATION:   Elderly active male with chronic hiatal hernia.  Had episode of gastric partial obstruction due to the incarcerated hiatal hernia 2 years ago.  Improved with bowel rest and decompression.  Declined elective repair.  Had intermittent pains past few years.  However had a more severe episode with nausea vomiting and severe pain.  Progressively worsened.  Pain emergency room.  Larger hiatal hernia.   Stomach flipped into the chest.  Nasogastric tube placed still with pain and discomfort.  Concern for possible ischemia if not in these total obstruction.  Recommendation was made for urgent reduction and repair of hiatal hernia.  Mainly invasive options versus open option noted.    The anatomy & physiology of the foregut and anti-reflux mechanism was discussed.  The pathophysiology of hiatal herniation and GERD was discussed.  Natural history risks without surgery was discussed.   The patient's symptoms are not adequately controlled by medicines and other non-operative treatments.  I feel the risks of no intervention will lead to serious problems that outweigh the operative risks; therefore, I recommended surgery to reduce the hiatal hernia out of the chest and fundoplication to rebuild the anti-reflux valve and control reflux better.  Need for a thorough workup to rule out the differential diagnosis and plan treatment was explained.  I explained laparoscopic techniques with possible need for an open approach.  Risks such as bleeding, infection, abscess, leak, need for further treatment, heart attack, death, and other risks were discussed.   I noted a good likelihood this will help address the problem.  Goals of post-operative recovery were discussed as well.  Possibility that this will not correct all symptoms was explained.  Post-operative dysphagia, need for short-term liquid & pureed diet, inability to vomit, possibility of reherniation, possible need for medicines to help control symptoms in addition to surgery were discussed.  We will work to minimize complications.   Educational handouts further explaining the pathology, treatment options, and dysphagia diet was given as well.  Questions were answered.  The patient expresses understanding & wishes to proceed  with surgery.  OR FINDINGS:   LARGE paraesophageal hiatal hernia with 100% of the stomach and duodenal bulb in the mediastinum.  There was a 10  x 9 cm hiatal defect.  It is a primary repair over pledgets.   The patient has a 3.5 cm Toupet (partial posterior 144 degrees) fundoplication given the patient's advanced age, somewhat patulous esophagus, and inability to do manometry or further work-up in this emergent setting.  The patient has had anterior and posterior gastropexy.  DESCRIPTION:   Informed consent was confirmed.  The patient received IV antibiotics prior to incision.  The underwent general anesthesia without difficulty.  A Foley catheter sterilely placed.  The patient was positioned in split leg with arms tucked. The abdomen was prepped and draped in the sterile fashion.  Surgical time-out confirmed our plan.  I placed a 8 mm port in the left subcostal region using Varess entry technique with the patient in steep reverse Trendelenburg and left side up.  Entry was clean.  We induced carbon dioxide insufflation.  Camera inspection revealed no injury.  Under direct visualization, I placed extra ports.  I also placed a 5 mm port in the left subxiphoid region under direct visualization.  I removed that and placed an Omega-shaped rigid Nathanson liver retractor to lift the left lateral sector of the liver anteriorly to expose the esophageal hiatus.  This was secured to the bed using the iron man system.  The Xi robot was carefully docked and instruments placed and advanced under direct visualization.  We focused on dissection.  We grasped the anterior mediastinal sac at the apex of the crus.  I scored through that and got into the anterior mediastinum.  But in reducing the anterior mediastinal hernia sac down.  However visualization was challenged due to massive distention of the stomach.  I had anesthesia upsized to a larger nasogastric tube with some partial help.  Was able to eventually reduce the entire stomach and flip it from its mesenteroaxial orientation and reduce the stomach and duodenal bulb.  That help straighten it out.  Most  nasogastric tube is getting better decompressed.  Therefore could focus on further dissection.  I was able to free the mediastinal sac from its attachments to the pericardium and bilateral pleura using primarily focused gentle blunt dissection as well as focused vessel sealer dissection.  I transected phrenoesophageal attachments to the inner right crus, preserving a two centimeter cuff of mediastinal sac until I found the base of the crura.  I then came around anteriorly on the left side and freed up the phrenoesophageal attachments of the mediastinal sac on the medial part of the left crus on the superior half.  I did careful mediastinal dissection to free the mediastinal sac.  With that, we could relieve the suction cup affect of the hernia sac and help reduce the stomach back down into the abdomen, flipped back approriately.   We ligated the short gastrics along the lesser curvature of the stomach about a third the way down and then came up proximally over the fundus.  We released the attachments of the stomach to the retroperitoneum until we were able to connect with the prior dissection on the left crus.  We completed the release of phrenoesophageal attachments to the medial part of the left crus down to its base.  With this, we had circumferential mobilization.    We placed the stomach and esophagus on axial tension.  I then did a Type II mediastinal dissection  where I freed the esophagus from its attachments to the aorta, spine, pleura, and pericardium using primarily gentle blunt as well as focused ultrasonic dissection.  We saw the anterior & posterior vagus nerves intact.  We preserved it at all times.  I procedded to dissect about 25 cm proximally into the mediastinum.  I did get into the right pleura as it was densely adherent to the right posterior esophagus.  I opened up further to have a good nice opening to avoid any tension situation.  Had to do it on the left pleura as well.  With that I could  straighten out the esophagus and get 5 cm of intra-abdominal length of the esophagus at a best estimation.  I freed the anterior mediastinal sac off the esophagus & stomach.  We saw the anterior vagus nerve and freed the sac off of the vagus.  I dissected out & removed the fatty epiphrenic pads at the esophagogastric junction. With that, I could better define the esophagogastric junction.  I confirmed the the patient had 5 cm of intra-abdominal esophageal length off tension.  Next I robotically gently clamped off the jejunal at the ligament of Treitz.  Did EGD and was able to pass transorally into a mildly patulous esophagus.  Nasogastric tube was going down the stomach.  Lot of grayish fluid with large volume of food chunks consistent with chronic incarceration.  Mucosa was viable.  Stomach distended without any air bubbles consistent with a negative air leak test, arguing against any esophageal or gastric perforation or injury.  It was hard to get great visualization with the large volume of food and fluid within it, so I held off on doing any more aggressive endoscopy at this time.  There is no frank blood or bleeding.  No frank coffee grounds to my visualization.  Make sure that the nasogastric tube was advanced and stayed in the stomach as I gently brought the end NG tube back, sucking out excess gas and better decompressing the stomach.  I brought the fundus of the stomach posterior to the esophagus over to the right side.  The wrap was mobile with the classic shoe shine maneuver.  Wrap became together gently.  We reflected the stomach left laterally and closed the esophageal hiatus using 0 Ethibond stitch using horizontal mattress stitches with pledgets on both sides.  I did that x3 stitches.  The crura had good substance and they came together well without any tension.  Because the urgent emergent setting, I do not think it was a great idea to try do mesh reinforcement at this time.  I brought the  fundus of the stomach behind the esophagus and cardia to set up a fundoplication wrap.  I did a posterior gastropexy by taking of #1 Ethibond stitches to the posterior part of the right side of the wrap and thru the crural closure.  He had 3 interrupted sutures provided good posterior gastropexy.  I did anterior gastropexy's with the esophagus crura and most superior part of the right side of the stomach wrap.  Did a similar one on the left anterior aspect.  Because of the emergent situation, advanced age, patulous esophagus, lack of Miniter epic data; I decided to just a partial fundoplication like I would for an achalasia patient.  I did 2 more pairs of esophagogastric stitches.  Anterolateral esophagus to the anterior wrap on the right and left.  I did one extra stitch on the left side as well since the left  gastropexy had come to go on rather superiorly.  Provided with a 3.5 cm long partial posterior fundoplication.  I can easily pass a large broad grasper between the esophageal wall and anterior crura into the mediastinum, implying a gentle snug but not constrictive hiatal closure.  The wrap was soft and floppy.  I placed a drain as noted above.  I did irrigation and ensured hemostasis.  I saw no evidence of any leak or perforation or other abnormality.  I removed the [facility] liver retractor under direct visualization.  I evacuated carbon dioxide and removed the ports.  Drain site secured with 2-0 Prolene.  The skin was closed with Monocryl and sterile dressings applied.  Blood loss was minimal and technically went efficiently.  However, patient with significant oxygen & PEEP needs.  It was felt by anesthesia will be not safe to extubate the patient.  Therefore leaving intubated to go to the intensive care unit.  I called discussed with Dr. Baltazar Apo with [facility].  They will help follow the patient with [place].  Hopefully this in the lungs now not compressed by giant stomach hiatal  hernia, they will reexpand, he will stabilize, and he can be extubated soon; perhaps as soon as tomorrow next morning.  He had good exercise tolerance and had a decent echocardiogram.  However, he is of advanced age and this is urgent/emergent surgery.  We will see.  I am about to discuss it with the family as well.  Adin Hector, M.D., F.A.C.S. Gastrointestinal and Minimally Invasive Surgery [facility] [address] [place], [place] [address] [phone] Main / Paging

## 2018-11-07 NOTE — Progress Notes (Signed)
Patient intensive care unit.  Moving all extremities.  Nursing & RT in room.   Dr. Lamonte Sakai outside room evaluating patient.  I discussed operative findings, updated the patient's status, discussed probable steps to recovery, and gave postoperative recommendations to the patient's family.  Recommendations were made.  Questions were answered.  They expressed understanding & appreciation.  Adin Hector, MD, FACS, MASCRS Gastrointestinal and Minimally Invasive Surgery    [address] [place], [place] [address] [phone] Main / Paging [phone] Fax

## 2018-11-07 NOTE — ED Notes (Signed)
Call Nick(son) if pt is moved. [phone]

## 2018-11-07 NOTE — Anesthesia Procedure Notes (Signed)
Arterial Line Insertion Start/End1/29/2020 2:43 PM, 11/07/2018 2:45 PM Performed by: Barnet Glasgow, MD, anesthesiologist  Patient location: OR. Preanesthetic checklist: patient identified, IV checked, site marked, risks and benefits discussed, surgical consent, monitors and equipment checked, pre-op evaluation, timeout performed and anesthesia consent Right, radial was placed Catheter size: 20 G Hand hygiene performed  and maximum sterile barriers used  Allen's test indicative of satisfactory collateral circulation Attempts: 1 Procedure performed without using ultrasound guided technique. Following insertion, dressing applied and Biopatch. Post procedure assessment: normal and unchanged  Patient tolerated the procedure well with no immediate complications.

## 2018-11-07 NOTE — ED Notes (Signed)
Ival Pacer, son, [phone]

## 2018-11-07 NOTE — Consult Note (Signed)
Cardiology Consultation:   Patient ID: Daniel Dalton. MRN: [id number]; DOB: [birth year]  Admit date: 11/07/2018 Date of Consult: 11/07/2018  Primary Care Provider: Prince Solian, MD Primary Cardiologist: Truitt Merle, NP /Dr. Aundra Dubin Primary Electrophysiologist:  None    Patient Profile:   Daniel Dalton. is a 78 y.o. male with a hx of PAF, HTN, OSA, ulcerative colitis who is being seen today for the evaluation of pre-op eval at the request of Dr. Jerald Kief.  History of Present Illness:   Mr. Hillier has a hx of HTN, OSA, ulcerative colitis and PAF.   On last visit he had been maintaining SR and has refused anticoagulation with CHA2DS2-VASc Score of 3.  Last Echo 2014 with EF 55-60%, G2DD, no RWMA, mild MR, RA was mildly dilated  TR moderate regurg.  Mild pulmonary hypertension.  No recent episodes of diastolic HF.  OSA with CPAP.   I do not find cardiac cath or stress test.   Now admitted 11/07/17 with abd pain. He does have a hx of ulcerative colitis on sulfasalazine and hiatal hernia.   Mid abd pain.    EKG:  The EKG was personally reviewed and demonstrates:  ST at 115 with RSR' in V1 or V2, right VCD or RVH, no acute changes from 08/2018 except HR is faster.  Telemetry:  Telemetry was personally reviewed and demonstrates:  None  Na 140, K+ 4.3, BUN 20, Cr 0.82, LFTs nomral.  WBC 8.5, Hgb 12.5 plts 296  CT of chest with Diffuse aorticcalcifications are seen without aneurysmal dilatation. No cardiac enlargement is noted. Coronary calcifications are seen. Stable appearing hiatal hernia dating back to 2018. The majority of the stomach lies within the chest cavity. Stomach remains distended with fluid which may be related to a partial gastric outlet obstruction. Organoaxial volvulus is noted as well also stable from the prior exam.  CT of abd with contrast  1. Incompletely visualized large hiatal hernia containing the majority of the stomach which is distended. The gastric  antrum is at the gastroesophageal junction and the gastroesophageal junction is above the field of view. Partial obstruction may be present either due to compression of the downward herniated gastric body and gastric volvulus is also possible. 2. Right kidney lower pole nonobstructing stone. 3. Sigmoid diverticulosis without findings of acute diverticulitis.  11/07/18 CXR port Large hiatal hernia. NG tube noted with its tip coiled in the hiatal hernia.  Pt has been in usual state of good health- walks a mile every day with no chest pain or SOB.  He can climb 2 levels of stairs without chest pain or  SOB.  He has not had edema or any cardiac issues.  No awareness of any a fib.      Past Medical History:  Diagnosis Date  . Arrhythmia   . Arthritis   . Colitis   . Family history of anesthesia complication    mother post- op [place]  . Heart murmur   . History of kidney stones   . Hypertension   . Paroxysmal atrial fibrillation (HCC)    history of  . Prostate cancer ([place])    in remission  . Sleep apnea    uses CPAP  . Umbilical hernia     Past Surgical History:  Procedure Laterality Date  . ANTERIOR CERVICAL DECOMP/DISCECTOMY FUSION N/A 11/21/2013   Procedure: Cervical four-five, Cervical five-six, Cervical six-seven anterior cervical decompression with fusion plating and bonegraft;  Surgeon: Hosie Spangle, MD;  Location:  [facility];  Service: Neurosurgery;  Laterality: N/A;  Cervical four-five, Cervical five-six, Cervical six-seven anterior cervical decompression with fusion plating and bonegraft  . BACK SURGERY    . LEG SURGERY Right   . NASAL SINUS SURGERY    . PROSTATECTOMY       Home Medications:  Prior to Admission medications   Medication Sig Start Date End Date Taking? Authorizing Provider  aspirin EC 81 MG tablet Take 1 tablet (81 mg total) by mouth daily. 02/13/13  Yes Larey Dresser, MD  metoprolol tartrate (LOPRESSOR) 25 MG tablet Take 1 tablet (25 mg total)  by mouth 2 (two) times daily. (BETA BLOCKER) 09/25/18  Yes Burtis Junes, NP  sulfaSALAzine (AZULFIDINE) 500 MG tablet Take 2,000 mg by mouth 2 (two) times daily.    Yes [provider]  telmisartan (MICARDIS) 80 MG tablet Take 80 mg by mouth daily.  08/07/18  Yes [provider]    Inpatient Medications: Scheduled Meds: . HYDROmorphone      . iopamidol      . metoprolol tartrate  5 mg Intravenous Q8H  . [START ON 11/10/2018] pantoprazole  40 mg Intravenous Q12H   Continuous Infusions: . dextrose 5 % and 0.9% NaCl 100 mL/hr at 11/07/18 0850  . pantoprozole (PROTONIX) infusion 8 mg/hr (11/07/18 0605)   PRN Meds: hydrALAZINE, HYDROmorphone (DILAUDID) injection  Allergies:    Allergies  Allergen Reactions  . Fluorescein Rash    Social History:   Social History   Socioeconomic History  . Marital status: Married    Spouse name: Ebb Carelock  . Number of children: 2  . Years of education: Not on file  . Highest education level: Not on file  Occupational History  . Occupation: retired    Comment: IT sales professional: RETIRED  Social Needs  . Financial resource strain: Not on file  . Food insecurity:    Worry: Not on file    Inability: Not on file  . Transportation needs:    Medical: Not on file    Non-medical: Not on file  Tobacco Use  . Smoking status: Former Smoker    Packs/day: 0.50    Years: 15.00    Pack years: 7.50    Types: Cigarettes    Last attempt to quit: 10/10/1969    Years since quitting: 49.1  . Smokeless tobacco: Former Systems developer    Types: Chew  Substance and Sexual Activity  . Alcohol use: No  . Drug use: No  . Sexual activity: Not on file  Lifestyle  . Physical activity:    Days per week: Not on file    Minutes per session: Not on file  . Stress: Not on file  Relationships  . Social connections:    Talks on phone: Not on file    Gets together: Not on file    Attends religious service: Not on file     Active member of club or organization: Not on file    Attends meetings of clubs or organizations: Not on file    Relationship status: Not on file  . Intimate partner violence:    Fear of current or ex partner: Not on file    Emotionally abused: Not on file    Physically abused: Not on file    Forced sexual activity: Not on file  Other Topics Concern  . Not on file  Social History Narrative  . Not on file    Family History:  Family History  Problem Relation Age of Onset  . Parkinsonism Father 10       deceased  . Dementia Mother 8       deceased  . Anesthesia problems Mother   . Hypertension Brother 57       alive  . Hypertension Sister 64       alive  . Hypothyroidism Sister      ROS:  Please see the history of present illness.  General:no colds or fevers, no weight changes Skin:no rashes or ulcers HEENT:no blurred vision, no congestion CV:see HPI PUL:see HPI GI: severe abd pain GU:no hematuria, no dysuria MS:no joint pain, no claudication Neuro:no syncope, no lightheadedness Endo:no diabetes, no thyroid disease  All other ROS reviewed and negative.     Physical Exam/Data:   Vitals:   11/07/18 0800 11/07/18 0850 11/07/18 0856 11/07/18 0900  BP: (!) 161/97   (!) 166/110  Pulse: (!) 115 (!) 122 93 92  Resp: (!) 22 (!) 21 (!) 23 (!) 22  Temp:      TempSrc:      SpO2: 95% 94% 96% 95%  Weight:      Height:        Intake/Output Summary (Last 24 hours) at 11/07/2018 1004 Last data filed at 11/07/2018 0604 Gross per 24 hour  Intake 100 ml  Output -  Net 100 ml   Last 3 Weights 11/06/2018 08/21/2018 12/25/2016  Weight (lbs) 190 lb 193 lb 198 lb 4.8 oz  Weight (kg) 86.183 kg 87.544 kg 89.948 kg     Body mass index is 27.26 kg/m.  General:  Well nourished, well developed, in acute distress with abd pain HEENT: normal Lymph: no adenopathy Neck: no JVD Endocrine:  No thryomegaly Vascular: No carotid bruits; pedal pulses 2+ bilaterally   Cardiac:  normal  S1, S2; RRR; no murmur rub or click Lungs:  clear to auscultation bilaterally, no wheezing, rhonchi or rales  Abd: soft, +tenderness and increases with movement.   Ext: no edema Musculoskeletal:  No deformities, BUE and BLE strength normal and equal Skin: warm and dry  Neuro:  Alert and oriented X 3 MAE follows commands , no focal abnormalities noted Psych:  Normal affect    Relevant CV Studies: Echo 2014 Study Conclusions  - Left ventricle: The cavity size was normal. Wall thickness was normal. Systolic function was normal. The estimated ejection fraction was in the range of 55% to 60%. Wall motion was normal; there were no regional wall motion abnormalities. Features are consistent with a pseudonormal left ventricular filling pattern, with concomitant abnormal relaxation and increased filling pressure (grade 2 diastolic dysfunction). - Aortic valve: There was no stenosis. - Mitral valve: Mild regurgitation. - Left atrium: The atrium was mildly dilated. - Right ventricle: The cavity size was normal. Systolic function was normal. - Right atrium: The atrium was mildly dilated. - Tricuspid valve: Moderate regurgitation. Peak RV-RA gradient: 37m Hg (S). - Pulmonary arteries: PA systolic pressure 361-44mmHg. - Systemic veins: IVC measured 1.9 cm with normal respirophasic variation, suggesting RA pressure 6-10 mmHg. Impressions:  - Normal LV size and systolic function, EF 531-54% Moderate diastolic dysfunction. Normal RV size and systolic function. Moderate tricuspid regurgitation, mild mitral regurgitation. Mild pulmonary hypertension.  Laboratory Data:  Chemistry Recent Labs  Lab 11/06/18 2159  NA 140  K 4.3  CL 105  CO2 24  GLUCOSE 130*  BUN 20  CREATININE 0.82  CALCIUM 9.6  GFRNONAA >60  GFRAA >60  ANIONGAP  11    Recent Labs  Lab 11/06/18 2159  PROT 8.1  ALBUMIN 4.7  AST 19  ALT 14  ALKPHOS 76  BILITOT 0.9    Hematology Recent Labs  Lab 11/06/18 2159  WBC 8.5  RBC 4.44  HGB 12.5*  HCT 38.9*  MCV 87.6  MCH 28.2  MCHC 32.1  RDW 14.1  PLT 296   Cardiac EnzymesNo results for input(s): TROPONINI in the last 168 hours. No results for input(s): TROPIPOC in the last 168 hours.  BNPNo results for input(s): BNP, PROBNP in the last 168 hours.  DDimer No results for input(s): DDIMER in the last 168 hours.  Radiology/Studies:  Ct Chest Wo Contrast  Result Date: 11/07/2018 CLINICAL DATA:  Hematemesis and possible EXAM: CT CHEST WITHOUT CONTRAST TECHNIQUE: Multidetector CT imaging of the chest was performed following the standard protocol without IV contrast. COMPARISON:  CT abdomen from 11/07/2018 and CT chest abdomen and pelvis from 12/24/2016 FINDINGS: Cardiovascular: Limited due to lack of IV contrast. Diffuse aortic calcifications are seen without aneurysmal dilatation. No cardiac enlargement is noted. Coronary calcifications are seen. Mediastinum/Nodes: Thoracic inlet is within normal limits. No hilar or mediastinal adenopathy is identified. Lungs/Pleura: Lungs are well aerated with minimal left basilar atelectasis stable from the prior exam related to the patient's known hiatal hernia. No focal infiltrate or sizable parenchymal nodule is seen. Upper Abdomen: Large hiatal hernia is again identified with the majority of the stomach within the chest cavity. 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 Stomach remains distended with air and fluid. This may represent a degree of long-term outlet obstruction. Clinical correlation is recommended. Musculoskeletal: Postsurgical changes in the cervical spine are noted. Degenerative changes of the thoracic spine are seen. Old healed rib fractures are noted on the left. IMPRESSION: Stable appearing hiatal hernia dating back to 2018. The majority of the stomach lies within the chest cavity. Stomach remains distended with fluid which may be related to a  partial gastric outlet obstruction. Organoaxial volvulus is noted as well also stable from the prior exam. Aortic Atherosclerosis (ICD10-I70.0). Electronically Signed   By: Inez Catalina M.D.   On: 11/07/2018 07:20   Ct Abdomen Pelvis W Contrast  Result Date: 11/07/2018 CLINICAL DATA:  78 y/o M; generalized abdominal pain, nausea, and vomiting. EXAM: CT ABDOMEN AND PELVIS WITH CONTRAST TECHNIQUE: Multidetector CT imaging of the abdomen and pelvis was performed using the standard protocol following bolus administration of intravenous contrast. CONTRAST:  110m ISOVUE-300 IOPAMIDOL (ISOVUE-300) INJECTION 61% COMPARISON:  05/11/2017 CT abdomen and pelvis. FINDINGS: Lower chest: No acute abnormality. Hepatobiliary: No focal liver abnormality is seen. No gallstones, gallbladder wall thickening, or biliary dilatation. Pancreas: Unremarkable. No pancreatic ductal dilatation or surrounding inflammatory changes. Spleen: Normal in size without focal abnormality. Adrenals/Urinary Tract: Stable left adrenal nodule when measured in the coronal plane and 23 mm and on the prior study in similar fashion (series 4, image 63). Right kidney lower pole 5 mm nonobstructing stone. Multiple small stable renal cyst. No hydronephrosis or ureter stone. Normal bladder. Stomach/Bowel: Large hiatal hernia containing the majority of the stomach. Stomach is distended and fluid-filled with a portion of the gastric body below the diaphragm. The gastric antrum is at the gastroesophageal junction and the gastroesophageal junction is above the field of view. Appendix appears normal. No evidence of bowel wall thickening, distention, or inflammatory changes. Sigmoid diverticulosis without findings of acute diverticulitis. Vascular/Lymphatic: Aortic atherosclerosis. No enlarged abdominal or pelvic lymph nodes. Reproductive: Prostate is unremarkable. Other: No  abdominal wall hernia or abnormality. No abdominopelvic ascites. Musculoskeletal: No fracture  is seen. Multilevel degenerative changes of the lumbar spine and mild-to-moderate bilateral hip osteoarthrosis. IMPRESSION: 1. Incompletely visualized large hiatal hernia containing the majority of the stomach which is distended. The gastric antrum is at the gastroesophageal junction and the gastroesophageal junction is above the field of view. Partial obstruction may be present either due to compression of the downward herniated gastric body and gastric volvulus is also possible. 2. Right kidney lower pole nonobstructing stone. 3. Sigmoid diverticulosis without findings of acute diverticulitis. Electronically Signed   By: Kristine Garbe M.D.   On: 11/07/2018 05:45   Dg Chest Portable 1 View  Result Date: 11/07/2018 CLINICAL DATA:  Large hiatal hernia. History of atrial fibrillation. Abdominal pain. EXAM: PORTABLE CHEST 1 VIEW COMPARISON:  CT 11/07/2018. FINDINGS: Mediastinum and hilar structures normal. Large hiatal hernia. NG tube tip noted coiled in the hiatal hernia. Cardiomegaly with normal pulmonary vascularity. Mild basilar atelectasis. No pleural effusion or pneumothorax. No acute bony abnormality. Prior cervical spine fusion. IMPRESSION: 1. Large hiatal hernia. NG tube noted with its tip coiled in the hiatal hernia. 2. Cardiomegaly. No pulmonary venous congestion. Mild bibasilar atelectasis. Electronically Signed   By: Marcello Moores  Register   On: 11/07/2018 09:57    Assessment and Plan:   1. Pre-op eval with hx of PAF none recently and no anticoagulation secondary to pt's refusal. No known CAD though on CT of chest there was coronary calcifications but to be expected at his age, no angina or dyspnea and no awareness of irregular heart beat.prior to admit and has maintained his usual activity.  Normal EF on last echo.  No recent HF. 0.9% risk of major cardiac events. 2. PAF no symptoms of this recently and now in STach  3. HTN elevated currently with pain.   4. Chronic diastolic dysfunction  euvolemic on exam 5. OSA uses CPAP continue this admit.       For questions or updates, please contact [place] Please consult [email or web address] for contact info under     Signed, Cecilie Kicks, NP  11/07/2018 10:04 AM

## 2018-11-07 NOTE — Interval H&P Note (Signed)
History and Physical Interval Note:  11/07/2018 1:29 PM  Daniel Dalton.  has presented today for surgery, with the diagnosis of incarcerated hiatal hernia with volvulus  The various methods of treatment have been discussed with the patient and family. After consideration of risks, benefits and other options for treatment, the patient has consented to  Procedure(s): XI ROBOTIC [place] , EGD (N/A) as a surgical intervention .  The patient's history has been reviewed, patient examined, no change in status, stable for surgery.  I have reviewed the patient's chart and labs.  Questions were answered to the patient's satisfaction.    I have re-reviewed the the patient's records, history, medications, and allergies.  I have re-examined the patient.  I again discussed intraoperative plans and goals of post-operative recovery.  The patient agrees to proceed.  Daniel Dalton.  Daniel Dalton, Daniel Dalton [phone]  Patient Care Team: Prince Solian, MD as PCP - General (Internal Medicine) Burtis Junes, NP as PCP - Cardiology (Nurse Practitioner) Clarene Essex, MD as Consulting Physician (Gastroenterology) Michael Boston, MD as Consulting Physician (General Surgery)  Patient Active Problem List   Diagnosis Date Noted  . Bowel obstruction ([place]) 11/07/2018  . Gastric outlet obstruction 11/07/2018  . Gastric out let obstruction 11/07/2018  . Coffee ground emesis 11/07/2018  . Incarcerated hiatal hernia 11/07/2018  . 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 Hiatal hernia without gangrene and obstruction 12/25/2016  . HNP (herniated nucleus pulposus), cervical 11/21/2013  . Atrial fibrillation ([place]) 08/12/2013  . Murmur 02/05/2013  . Postop check 02/07/2012  . Sebaceous cyst 01/30/2012  . Atrial fibrillation with rapid ventricular response ([place]) 01/09/2011  . Obstructive sleep apnea 01/09/2011  . Hypertension 01/09/2011  .  Ulcerative colitis ([place]) 01/09/2011  . Hyperlipidemia 01/09/2011    Past Medical History:  Diagnosis Date  . Arrhythmia   . Arthritis   . Colitis   . Family history of anesthesia complication    mother post- op NV  . Heart murmur   . History of kidney stones   . Hypertension   . Paroxysmal atrial fibrillation (H[facility])    history of  . Prostate cancer ([place])    in remission  . Sleep apnea    uses CPAP  . Umbilical hernia     Past Surgical History:  Procedure Laterality Date  . ANTERIOR CERVICAL DECOMP/DISCECTOMY FUSION N/A 11/21/2013   Procedure: Cervical four-five, Cervical five-six, Cervical six-seven anterior cervical decompression with fusion plating and bonegraft;  Surgeon: Hosie Spangle, MD;  Location: [facility];  Service: Neurosurgery;  Laterality: N/A;  Cervical four-five, Cervical five-six, Cervical six-seven anterior cervical decompression with fusion plating and bonegraft  . BACK SURGERY    . LEG SURGERY Right   . NASAL SINUS SURGERY    . PROSTATECTOMY      Social History   Socioeconomic History  . Marital status: Married    Spouse name: Daniel Dalton  . Number of children: 2  . Years of education: Not on file  . Highest education level: Not on file  Occupational History  . Occupation: retired    Comment: IT sales professional: RETIRED  Social Needs  . Financial resource strain: Not on file  . Food insecurity:    Worry: Not on file    Inability: Not on file  . Transportation needs:    Medical: Not on file    Non-medical: Not on file  Tobacco Use  .  Smoking status: Former Smoker    Packs/day: 0.50    Years: 15.00    Pack years: 7.50    Types: Cigarettes    Last attempt to quit: 10/10/1969    Years since quitting: 49.1  . Smokeless tobacco: Former Systems developer    Types: Chew  Substance and Sexual Activity  . Alcohol use: No  . Drug use: No  . Sexual activity: Not on file  Lifestyle  . Physical activity:    Days per week: Not  on file    Minutes per session: Not on file  . Stress: Not on file  Relationships  . Social connections:    Talks on phone: Not on file    Gets together: Not on file    Attends religious service: Not on file    Active member of club or organization: Not on file    Attends meetings of clubs or organizations: Not on file    Relationship status: Not on file  . Intimate partner violence:    Fear of current or ex partner: Not on file    Emotionally abused: Not on file    Physically abused: Not on file    Forced sexual activity: Not on file  Other Topics Concern  . Not on file  Social History Narrative  . Not on file    Family History  Problem Relation Age of Onset  . Parkinsonism Father 73       deceased  . Dementia Mother 49       deceased  . Anesthesia problems Mother   . Hypertension Brother 32       alive  . Hypertension Sister 58       alive  . Hypothyroidism Sister     Medications Prior to Admission  Medication Sig Dispense Refill Last Dose  . aspirin EC 81 MG tablet Take 1 tablet (81 mg total) by mouth daily.   11/06/2018 at Unknown time  . metoprolol tartrate (LOPRESSOR) 25 MG tablet Take 1 tablet (25 mg total) by mouth 2 (two) times daily. (BETA BLOCKER) 180 tablet 3 11/06/2018 at 1800  . sulfaSALAzine (AZULFIDINE) 500 MG tablet Take 2,000 mg by mouth 2 (two) times daily.    11/06/2018 at Unknown time  . telmisartan (MICARDIS) 80 MG tablet Take 80 mg by mouth daily.    11/06/2018 at Unknown time    Current Facility-Administered Medications  Medication Dose Route Frequency Provider Last Rate Last Dose  . bupivacaine liposome (EXPAREL) 1.3 % injection 266 mg  [place] mL Infiltration Once Michael Boston, MD      . cefTRIAXone (ROCEPHIN) 2 g in sodium chloride 0.9 % 100 mL IVPB  2 g Intravenous On Call to OR Michael Boston, MD       And  . metroNIDAZOLE (FLAGYL) IVPB 500 mg  500 mg Intravenous On Call to OR Michael Boston, MD      . dextrose 5 %-0.9 % sodium chloride infusion    Intravenous Continuous Regalado, Belkys A, MD 100 mL/hr at 01/29/[place] 0850    . [MAR Hold] hydrALAZINE (APRESOLINE) injection 10 mg  10 mg Intravenous Q6H PRN Regalado, Belkys A, MD   10 mg at 01/29/[place] 1128  . HYDROmorphone (DILAUDID) 1 MG/ML injection           . [MAR Hold] HYDROmorphone (DILAUDID) injection 0.5 mg  0.5 mg Intravenous Q2H PRN Regalado, Belkys A, MD   0.5 mg at 01/29/[place] 1126  . iopamidol (ISOVUE-300) 61 % injection           . [  MAR Hold] metoprolol tartrate (LOPRESSOR) injection 5 mg  5 mg Intravenous Q8H Regalado, Belkys A, MD   5 mg at 01/29/[place] 0851  . pantoprazole (PROTONIX) 80 mg in sodium chloride 0.9 % 250 mL (0.32 mg/mL) infusion  8 mg/hr Intravenous Continuous Ward, Kristen N, DO 25 mL/hr at 01/29/[place] 0605 8 mg/hr at 01/29/[place] 0605  . [MAR Hold] pantoprazole (PROTONIX) injection 40 mg  40 mg Intravenous Q12H Ward, Kristen N, DO         Allergies  Allergen Reactions  . Fluorescein Rash    BP (!) 157/97   Pulse (!) 120   Temp 98.4 F (36.9 C) (Axillary)   Resp 18   Ht 5' 10"  (1.778 m)   Wt 86.2 kg   SpO2 92%   BMI 27.26 kg/m   Labs: Results for orders placed or performed during the hospital encounter of 01/29/[place] (from the past 48 hour(s))  Lipase, blood     Status: None   Collection Time: 01/28/[place]  9:59 PM  Result Value Ref Range   Lipase 37 11 - 51 U/L    Comment: Performed at [facility], [place] [address] [place], [place] [address]  Comprehensive metabolic panel     Status: Abnormal   Collection Time: 01/28/[place]  9:59 PM  Result Value Ref Range   Sodium 140 135 - 145 mmol/L   Potassium 4.3 3.5 - 5.1 mmol/L   Chloride 105 98 - 111 mmol/L   CO2 24 22 - 32 mmol/L   Glucose, Bld 130 (H) 70 - 99 mg/dL   BUN Dalton 8 - 23 mg/dL   Creatinine, Ser 0.82 0.61 - 1.24 mg/dL   Calcium 9.6 8.9 - 10.3 mg/dL   Total Protein 8.1 6.5 - 8.1 g/dL   Albumin 4.7 3.5 - 5.0 g/dL   AST 19 15 - 41 U/L   ALT 14 0 - 44 U/L   Alkaline Phosphatase 76 38 - 126 U/L    Total Bilirubin 0.9 0.3 - 1.2 mg/dL   GFR calc non Af Amer >60 >60 mL/min   GFR calc Af Amer >60 >60 mL/min   Anion gap 11 5 - 15    Comment: Performed at [facility], [place] [address] [place], [place] [address]  CBC     Status: Abnormal   Collection Time: 01/28/[place]ton  9:59 PM  Result Value Ref Range   WBC 8.5 4.0 - 10.5 K/uL   RBC 4.44 4.22 - 5.81 MIL/uL   Hemoglobin 12.5 (L) 13.0 - 17.0 g/dL   HCT 38.9 (L) 39.0 - 52.0 %   MCV 87.6 80.0 - 100.0 fL   MCH 28.2 26.0 - 34.0 pg   MCHC 32.1 30.0 - 36.0 g/dL   RDW 14.1 11.5 - 15.5 %   Platelets 296 150 - 400 K/uL   nRBC 0.0 0.0 - 0.2 %    Comment: Performed at [facility], [place] [address], [place],  [address]  Urinalysis, Routine w reflex microscopic     Status: Abnormal   Collection Time: 01/28/[place]ton 11:07 PM  Result Value Ref Range   Color, Urine AMBER (A) YELLOW    Comment: BIOCHEMICALS MAY BE AFFECTED BY COLOR   APPearance CLEAR CLEAR   Specific Gravity, Urine 1.024 1.005 - 1.030   pH 5.0 5.0 - 8.0   Glucose, UA NEGATIVE NEGATIVE mg/dL   Hgb urine dipstick NEGATIVE NEGATIVE   Bilirubin Urine NEGATIVE NEGATIVE   Ketones, ur Dalton (A) NEGATIVE mg/dL  Protein, ur 100 (A) NEGATIVE mg/dL   Nitrite NEGATIVE NEGATIVE   Leukocytes, UA NEGATIVE NEGATIVE   RBC / HPF 0-5 0 - 5 RBC/hpf   WBC, UA 0-5 0 - 5 WBC/hpf   Bacteria, UA NONE SEEN NONE SEEN   Squamous Epithelial / LPF 0-5 0 - 5   Mucus PRESENT    Hyaline Casts, UA PRESENT     Comment: Performed at [facility], [place] [address], [place], [place] [address]  Occult bld gastric/duodenum (cup to lab)     Status: Abnormal   Collection Time: 01/29/[place]ton  4:44 AM  Result Value Ref Range   pH, Gastric 4    Occult Blood, Gastric POSITIVE (A) NEGATIVE    Comment: Performed at [facility], [place] [address], [place], [place] [address]  Type and screen     Status: None   Collection Time: 01/29/[place]ton  5:17 AM  Result  Value Ref Range   ABO/RH(D) A POS    Antibody Screen NEG    Sample Expiration      11/10/2018 Performed at [facility], [place] [address] [place], [place] [address]   Protime-INR     Status: None   Collection Time: 01/29/[place]ton  5:17 AM  Result Value Ref Range   Prothrombin Time 12.8 11.4 - 15.2 seconds   INR 0.97     Comment: Performed at [facility], [place] [address] [place], [place] [address]  ABO/Rh     Status: None   Collection Time: 01/29/[place]  5:17 AM  Result Value Ref Range   ABO/RH(D)      A POS Performed at [facility], [place] [address], [place], [place] [address]   Lactic acid, plasma     Status: None   Collection Time: 01/29/[place]ton 11:32 AM  Result Value Ref Range   Lactic Acid, Venous 1.2 0.5 - 1.9 mmol/L    Comment: Performed at [facility], [place] [address] [place],  [address]    Imaging / Studies: Ct Chest Wo Contrast  Result Date: 11/07/2018 CLINICAL DATA:  Hematemesis and possible EXAM: CT CHEST WITHOUT CONTRAST TECHNIQUE: Multidetector CT imaging of the chest was performed following the standard protocol without IV contrast. COMPARISON:  CT abdomen from 11/07/2018 and CT chest abdomen and pelvis from 12/24/2016 FINDINGS: Cardiovascular: Limited due to lack of IV contrast. Diffuse aortic calcifications are seen without aneurysmal dilatation. No cardiac enlargement is noted. Coronary calcifications are seen. Mediastinum/Nodes: Thoracic inlet is within normal limits. No hilar or mediastinal adenopathy is identified. Lungs/Pleura: Lungs are well aerated with minimal left basilar atelectasis stable from the prior exam related to the patient's known hiatal hernia. No focal infiltrate or sizable parenchymal nodule is seen. Upper Abdomen: Large hiatal hernia is again identified with the majority of the stomach within the chest cavity. 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 Stomach remains  distended with air and fluid. This may represent a degree of long-term outlet obstruction. Clinical correlation is recommended. Musculoskeletal: Postsurgical changes in the cervical spine are noted. Degenerative changes of the thoracic spine are seen. Old healed rib fractures are noted on the left. IMPRESSION: Stable appearing hiatal hernia dating back to 2018. The majority of the stomach lies within the chest cavity. Stomach remains distended with fluid which may be related to a partial gastric outlet obstruction. Organoaxial volvulus is noted as well also stable from the prior exam. Aortic Atherosclerosis (ICD10-I70.0). Electronically Signed   By: Elta Guadeloupe  Lukens M.D.   On: 11/07/2018 07:[place]   Ct Abdomen Pelvis W Contrast  Result Date: 11/07/2018 CLINICAL DATA:  78 y/o M; generalized abdominal pain, nausea, and vomiting. EXAM: CT ABDOMEN AND PELVIS WITH CONTRAST TECHNIQUE: Multidetector CT imaging of the abdomen and pelvis was performed using the standard protocol following bolus administration of intravenous contrast. CONTRAST:  127m ISOVUE-300 IOPAMIDOL (ISOVUE-300) INJECTION 61% COMPARISON:  05/11/2017 CT abdomen and pelvis. FINDINGS: Lower chest: No acute abnormality. Hepatobiliary: No focal liver abnormality is seen. No gallstones, gallbladder wall thickening, or biliary dilatation. Pancreas: Unremarkable. No pancreatic ductal dilatation or surrounding inflammatory changes. Spleen: Normal in size without focal abnormality. Adrenals/Urinary Tract: Stable left adrenal nodule when measured in the coronal plane and 23 mm and on the prior study in similar fashion (series 4, image 63). Right kidney lower pole 5 mm nonobstructing stone. Multiple small stable renal cyst. No hydronephrosis or ureter stone. Normal bladder. Stomach/Bowel: Large hiatal hernia containing the majority of the stomach. Stomach is distended and fluid-filled with a portion of the gastric body below the diaphragm. The gastric antrum is at the  gastroesophageal junction and the gastroesophageal junction is above the field of view. Appendix appears normal. No evidence of bowel wall thickening, distention, or inflammatory changes. Sigmoid diverticulosis without findings of acute diverticulitis. Vascular/Lymphatic: Aortic atherosclerosis. No enlarged abdominal or pelvic lymph nodes. Reproductive: Prostate is unremarkable. Other: No abdominal wall hernia or abnormality. No abdominopelvic ascites. Musculoskeletal: No fracture is seen. Multilevel degenerative changes of the lumbar spine and mild-to-moderate bilateral hip osteoarthrosis. IMPRESSION: 1. Incompletely visualized large hiatal hernia containing the majority of the stomach which is distended. The gastric antrum is at the gastroesophageal junction and the gastroesophageal junction is above the field of view. Partial obstruction may be present either due to compression of the downward herniated gastric body and gastric volvulus is also possible. 2. Right kidney lower pole nonobstructing stone. 3. Sigmoid diverticulosis without findings of acute diverticulitis. Electronically Signed   By: LKristine GarbeM.D.   On: 11/07/2018 05:45   Dg Chest Portable 1 View  Result Date: 11/07/2018 CLINICAL DATA:  Large hiatal hernia. History of atrial fibrillation. Abdominal pain. EXAM: PORTABLE CHEST 1 VIEW COMPARISON:  CT 11/07/2018. FINDINGS: Mediastinum and hilar structures normal. Large hiatal hernia. NG tube tip noted coiled in the hiatal hernia. Cardiomegaly with normal pulmonary vascularity. Mild basilar atelectasis. No pleural effusion or pneumothorax. No acute bony abnormality. Prior cervical spine fusion. IMPRESSION: 1. Large hiatal hernia. NG tube noted with its tip coiled in the hiatal hernia. 2. Cardiomegaly. No pulmonary venous congestion. Mild bibasilar atelectasis. Electronically Signed   By: TMarcello Moores Register   On: 11/07/2018 09:57     .SAdin Hector M.D., F.A.C.S. [facility] [address] [place] [phone] / Paging  11/07/2018 1:29 PM     SAdin Hector

## 2018-11-07 NOTE — Consult Note (Signed)
Daniel H Krista Som. is an 78 y.o. male.   Chief Complaint: abdominal pain Consult request Dr. Rudolpho Sevin  HPI: Patient is a 78 year old male who presented to the ED with abdominal pain.  He has a history of prior hiatal hernia left inguinal hernia repairs.  He complains of generalized diffuse pain that was severe nature.  He was last hospitalized on 12/25/2016, with a large hiatal hernia.  He had several weeks of worsening pain prior to that  admission.  CT showed a large hiatal hernia possibly a paraesophageal hernia but no signs of ischemia or obstruction.  He was admitted and placed bowel rest and symptoms resolved.  He was discharged 48 hours later and was instructed to follow-up with Dr. Ralene Ok.  Since discharge in 2018 he has had two episodes of pain that did not last very long.  Pain always comes after eating, and last less than 30 minutes before resolving.  Last p.m. he started having pain after eating around 6 PM.  Pain was 9 out of 10, somewhat better now, after pain medicines, but still has some pain with the pain medication.  Work-up in the ED shows he is afebrile but tachycardic.  Blood pressure is elevated.  CMP on admission was essentially normal except for a glucose of 130, WBC 8.5, hemoglobin 12.5, hematocrit 38.9, INR 0.97.  Urinalysis is negative.  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  Occult gastric was positive.  CT of the chest done this a.m. shows stable appearing hiatal hernia dating back to 2018 the majority stomach lies within the chest cavity stomach remains distended with fluid and may be related to partial gastric outlet obstruction.  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  The majority of the stomach is distended the gastric antrum is at the gastro esophageal junction and the gastroesophageal junction as above field-of-view partial obstruction may be present in the due to compression of the  downward herniated gastric body and gastric volvulus is also possible.  He has a history hypertension, proximal atrial fibrillation,(he is refused anticoagulation) history of ulcerative colitis hx of prostate cancer, and obstructive sleep apnea.   We are asked to see.      Past Medical History:  Diagnosis Date  . Arrhythmia   . Arthritis   . Colitis   . Family history of anesthesia complication    mother post- op [place]  . Heart murmur   . History of kidney stones   . Hypertension   . Paroxysmal atrial fibrillation (HCC)    history of  . Prostate cancer ([place])    in remission  . Sleep apnea    uses CPAP  . Umbilical hernia          Past Surgical History:  Procedure Laterality Date  . ANTERIOR CERVICAL DECOMP/DISCECTOMY FUSION N/A 11/21/2013   Procedure: Cervical four-five, Cervical five-six, Cervical six-seven anterior cervical decompression with fusion plating and bonegraft;  Surgeon: Hosie Spangle, MD;  Location: [facility];  Service: Neurosurgery;  Laterality: N/A;  Cervical four-five, Cervical five-six, Cervical six-seven anterior cervical decompression with fusion plating and bonegraft  . BACK SURGERY    . LEG SURGERY Right   . NASAL SINUS SURGERY    . PROSTATECTOMY           Family History  Problem Relation Age of Onset  . Parkinsonism Father 102       deceased  . Dementia Mother 51  deceased  . Anesthesia problems Mother   . Hypertension Brother 7       alive  . Hypertension Sister 72       alive  . Hypothyroidism Sister    Social History:  reports that he quit smoking about 49 years ago. His smoking use included cigarettes. He has a 7.50 pack-year smoking history. He has quit using smokeless tobacco.  His smokeless tobacco use included chew. He reports that he does not drink alcohol or use drugs.  Allergies:      Allergies  Allergen Reactions  . Fluorescein Rash           Prior to Admission medications    Medication Sig Start Date End Date Taking? Authorizing Provider  aspirin EC 81 MG tablet Take 1 tablet (81 mg total) by mouth daily. 02/13/13  Yes Larey Dresser, MD  metoprolol tartrate (LOPRESSOR) 25 MG tablet Take 1 tablet (25 mg total) by mouth 2 (two) times daily. (BETA BLOCKER) 09/25/18  Yes Burtis Junes, NP  sulfaSALAzine (AZULFIDINE) 500 MG tablet Take 2,000 mg by mouth 2 (two) times daily.    Yes [provider]  telmisartan (MICARDIS) 80 MG tablet Take 80 mg by mouth daily.  08/07/18  Yes [provider]      LabResultsLast48Hours        Results for orders placed or performed during the hospital encounter of 11/07/18 (from the past 48 hour(s))  Lipase, blood     Status: None   Collection Time: 11/06/18  9:59 PM  Result Value Ref Range   Lipase 37 11 - 51 U/L    Comment: Performed at [facility], [place] [address] [place], [place] [address]  Comprehensive metabolic panel     Status: Abnormal   Collection Time: 11/06/18  9:59 PM  Result Value Ref Range   Sodium 140 135 - 145 mmol/L   Potassium 4.3 3.5 - 5.1 mmol/L   Chloride 105 98 - 111 mmol/L   CO2 24 22 - 32 mmol/L   Glucose, Bld 130 (H) 70 - 99 mg/dL   BUN 20 8 - 23 mg/dL   Creatinine, Ser 0.82 0.61 - 1.24 mg/dL   Calcium 9.6 8.9 - 10.3 mg/dL   Total Protein 8.1 6.5 - 8.1 g/dL   Albumin 4.7 3.5 - 5.0 g/dL   AST 19 15 - 41 U/L   ALT 14 0 - 44 U/L   Alkaline Phosphatase 76 38 - 126 U/L   Total Bilirubin 0.9 0.3 - 1.2 mg/dL   GFR calc non Af Amer >60 >60 mL/min   GFR calc Af Amer >60 >60 mL/min   Anion gap 11 5 - 15    Comment: Performed at [facility], [place] [address], [place], [place] [address]  CBC     Status: Abnormal   Collection Time: 11/06/18  9:59 PM  Result Value Ref Range   WBC 8.5 4.0 - 10.5 K/uL   RBC 4.44 4.22 - 5.81 MIL/uL   Hemoglobin 12.5 (L) 13.0 - 17.0 g/dL   HCT 38.9 (L) 39.0 - 52.0 %   MCV  87.6 80.0 - 100.0 fL   MCH 28.2 26.0 - 34.0 pg   MCHC 32.1 30.0 - 36.0 g/dL   RDW 14.1 11.5 - 15.5 %   Platelets 296 150 - 400 K/uL   nRBC 0.0 0.0 - 0.2 %    Comment: Performed at [facility], [place] [address] [place], [place]  [address]  Urinalysis, Routine w reflex microscopic     Status: Abnormal   Collection Time: 11/06/18 11:07 PM  Result Value Ref Range   Color, Urine AMBER (A) YELLOW    Comment: BIOCHEMICALS MAY BE AFFECTED BY COLOR   APPearance CLEAR CLEAR   Specific Gravity, Urine 1.024 1.005 - 1.030   pH 5.0 5.0 - 8.0   Glucose, UA NEGATIVE NEGATIVE mg/dL   Hgb urine dipstick NEGATIVE NEGATIVE   Bilirubin Urine NEGATIVE NEGATIVE   Ketones, ur 20 (A) NEGATIVE mg/dL   Protein, ur 100 (A) NEGATIVE mg/dL   Nitrite NEGATIVE NEGATIVE   Leukocytes, UA NEGATIVE NEGATIVE   RBC / HPF 0-5 0 - 5 RBC/hpf   WBC, UA 0-5 0 - 5 WBC/hpf   Bacteria, UA NONE SEEN NONE SEEN   Squamous Epithelial / LPF 0-5 0 - 5   Mucus PRESENT    Hyaline Casts, UA PRESENT     Comment: Performed at [facility], [place] [address], [place], [place] [address]  Occult bld gastric/duodenum (cup to lab)     Status: Abnormal   Collection Time: 11/07/18  4:44 AM  Result Value Ref Range   pH, Gastric 4    Occult Blood, Gastric POSITIVE (A) NEGATIVE    Comment: Performed at [facility], [place] [address] [place], [place] [address]  Type and screen     Status: None   Collection Time: 11/07/18  5:17 AM  Result Value Ref Range   ABO/RH(D) A POS    Antibody Screen NEG    Sample Expiration      11/10/2018 Performed at [facility], [place] [address] [place], [place] [address]   Protime-INR     Status: None   Collection Time: 11/07/18  5:17 AM  Result Value Ref Range   Prothrombin Time 12.8 11.4 - 15.2 seconds   INR 0.97     Comment: Performed at [facility], [place]  [address], [place], [place] [address]  ABO/Rh     Status: None   Collection Time: 11/07/18  5:17 AM  Result Value Ref Range   ABO/RH(D)      A POS Performed at [facility], [place] [address] [place], [place] [address]       ImagingResults(Last48hours)  Ct Abdomen Pelvis W Contrast  Result Date: 11/07/2018 CLINICAL DATA:  78 y/o M; generalized abdominal pain, nausea, and vomiting. EXAM: CT ABDOMEN AND PELVIS WITH CONTRAST TECHNIQUE: Multidetector CT imaging of the abdomen and pelvis was performed using the standard protocol following bolus administration of intravenous contrast. CONTRAST:  14m ISOVUE-300 IOPAMIDOL (ISOVUE-300) INJECTION 61% COMPARISON:  05/11/2017 CT abdomen and pelvis. FINDINGS: Lower chest: No acute abnormality. Hepatobiliary: No focal liver abnormality is seen. No gallstones, gallbladder wall thickening, or biliary dilatation. Pancreas: Unremarkable. No pancreatic ductal dilatation or surrounding inflammatory changes. Spleen: Normal in size without focal abnormality. Adrenals/Urinary Tract: Stable left adrenal nodule when measured in the coronal plane and 23 mm and on the prior study in similar fashion (series 4, image 63). Right kidney lower pole 5 mm nonobstructing stone. Multiple small stable renal cyst. No hydronephrosis or ureter stone. Normal bladder. Stomach/Bowel: Large hiatal hernia containing the majority of the stomach. Stomach is distended and fluid-filled with a portion of the gastric body below the diaphragm. The gastric antrum is at the gastroesophageal junction and the gastroesophageal junction is above the field of view. Appendix appears normal. No evidence of bowel wall thickening, distention, or  inflammatory changes. Sigmoid diverticulosis without findings of acute diverticulitis. Vascular/Lymphatic: Aortic atherosclerosis. No enlarged abdominal or pelvic lymph nodes. Reproductive: Prostate is unremarkable. Other: No abdominal wall  hernia or abnormality. No abdominopelvic ascites. Musculoskeletal: No fracture is seen. Multilevel degenerative changes of the lumbar spine and mild-to-moderate bilateral hip osteoarthrosis. IMPRESSION: 1. Incompletely visualized large hiatal hernia containing the majority of the stomach which is distended. The gastric antrum is at the gastroesophageal junction and the gastroesophageal junction is above the field of view. Partial obstruction may be present either due to compression of the downward herniated gastric body and gastric volvulus is also possible. 2. Right kidney lower pole nonobstructing stone. 3. Sigmoid diverticulosis without findings of acute diverticulitis. Electronically Signed   By: Kristine Garbe M.D.   On: 11/07/2018 05:45     Review of Systems  Constitutional: Negative.   HENT: Negative.   Eyes: Negative.   Respiratory: Negative.        He has sleep apnea but cannot use the CPAP machine and has a dental device to help with this.  Cardiovascular: Negative.        He has a history of atrial fibrillation but says on the medications he has been stable and no arrhythmias.  Gastrointestinal: Positive for abdominal pain, constipation (He has recently had some issues with constipation.), nausea and vomiting. Negative for blood in stool, heartburn and melena.       Nausea vomiting abdominal pain started around 6 PM last evening and was acute.  This is the worst pain he has had with this.  Genitourinary: Negative.   Musculoskeletal: Negative.   Skin: Negative.   Neurological: Negative.   Endo/Heme/Allergies: Negative.   Psychiatric/Behavioral: Negative.     Blood pressure (!) 166/100, pulse (!) 111, temperature 97.7 F (36.5 C), temperature source Oral, resp. rate 19, height 5' 10"  (1.778 m), weight 86.2 kg, SpO2 96 %. Physical Exam  Constitutional: He is oriented to person, place, and time. He appears well-developed and well-nourished. No distress.  HENT:  Head:  Normocephalic and atraumatic.  Mouth/Throat: No oropharyngeal exudate.  Mouth is dry  Eyes: Right eye exhibits no discharge. Left eye exhibits no discharge. No scleral icterus.  Neck: Normal range of motion. Neck supple. No JVD present. No tracheal deviation present. No thyromegaly present.  Cardiovascular: Regular rhythm, normal heart sounds and intact distal pulses.  No murmur heard. Tachycardic, telemetry shows sinus tach rate is around 115.  Blood pressure is 164/100.  Respiratory: Effort normal and breath sounds normal. No respiratory distress. He has no wheezes. He has no rales. He exhibits no tenderness.  GI: Soft. He exhibits no distension and no mass. There is abdominal tenderness. There is no rebound and no guarding.  He has tenderness of pain in the mid abdominal portion of his abdomen.  He has a well-healed midline scar from prior surgery which he says is the prostatectomy. Still has a bit of a left femoral hernia  Musculoskeletal:        General: Edema (Trace on the right lower extremity) present. No tenderness.  Lymphadenopathy:    He has no cervical adenopathy.  Neurological: He is alert and oriented to person, place, and time. No cranial nerve deficit.  Skin: Skin is warm and dry. No rash noted. He is not diaphoretic. No erythema. No pallor.  Psychiatric: He has a normal mood and affect. His behavior is normal. Judgment and thought content normal.     Assessment/Plan Abdominal pain with hiatal hernia; majority of the  stomach is in the chest with organoaxial volvulus, stable from last exam Hx ulcerative colitis PAF -not on anticoagulation Hypertension Hx prostate cancer with prostatectomy Hx anterior cervical discectomy Sleep apnea -dental device  Plan: Plan to admit the patient to Medicine.  Place an NG for decompression of the stomach.  IV hydration. During his last admission he improved within 48 hours of admission to the ED.  We will check a lactate and observe  and follow with you.  Camora Tremain, PA-C 11/07/2018, 7:09 AM

## 2018-11-07 NOTE — H&P (Addendum)
History and Physical  Daniel Dalton. HUD:[id number] DOB: June 26, 1941 DOA: 11/07/2018  PCP: Prince Solian, MD Patient coming from: Home.   I have personally briefly reviewed patient's old medical records in [place]   Chief Complaint: abdominal pain.   HPI: Daniel Dalton. is a 78 y.o. male with PMH significant for A fib on aspirin, Ulcerative colitis on Sulfasalazine, hiatal hernia who presents with abdominal pain, pain is generalized that started after supper the night prior to admission. He describe pain as severe, constant, is not sharp, 10/10. Pain is located mid abdomen. He relates pain is associated with nausea and vomiting. He has vomited twice, one time in the ED. He vomited coffee ground emesis in the ED.  He report pain medication has decrease intensity if pain.  Patient currently denies chest pain, shortness of breath, cough, melena.   Evaluation in the Ed; Normal electrolytes, and WBC. Hb at 12.  Patient was noted to be tachycardic heart rate 858, systolic blood pressure 850/277, INR 0.9, acute blood from gastric contact positive.  UA negative for infection.  Lipase 37.   CT abdomen; Incompletely visualized large hiatal hernia containing the majority of the stomach which is distended. The gastric antrum is at the gastroesophageal junction and the gastroesophageal junction is above the field of view. Partial obstruction may be present either due to compression of the downward herniated gastric body and gastric volvulus is also possible.  Ct chest; Stable appearing hiatal hernia dating back to 2018. The majority of the stomach lies within the chest cavity. Stomach remains distended with fluid which may be related to a partial gastric outlet obstruction. Organoaxial volvulus is noted as well also stable from the prior exam.  In the emergency department patient received IV fluids, he was a started on IV Protonix drip, IV Dilaudid for pain. Surgery has been  consulted.  Review of Systems: All systems reviewed and apart from history of presenting illness, are negative.  Past Medical History:  Diagnosis Date  . Arrhythmia   . Arthritis   . Colitis   . Family history of anesthesia complication    mother post- op NV  . Heart murmur   . History of kidney stones   . Hypertension   . Paroxysmal atrial fibrillation (HCC)    history of  . Prostate cancer ([place])    in remission  . Sleep apnea    uses CPAP  . Umbilical hernia    Past Surgical History:  Procedure Laterality Date  . ANTERIOR CERVICAL DECOMP/DISCECTOMY FUSION N/A 11/21/2013   Procedure: Cervical four-five, Cervical five-six, Cervical six-seven anterior cervical decompression with fusion plating and bonegraft;  Surgeon: Hosie Spangle, MD;  Location: [facility];  Service: Neurosurgery;  Laterality: N/A;  Cervical four-five, Cervical five-six, Cervical six-seven anterior cervical decompression with fusion plating and bonegraft  . BACK SURGERY    . LEG SURGERY Right   . NASAL SINUS SURGERY    . PROSTATECTOMY     Social History:  reports that he quit smoking about 49 years ago. His smoking use included cigarettes. He has a 7.50 pack-year smoking history. He has quit using smokeless tobacco.  His smokeless tobacco use included chew. He reports that he does not drink alcohol or use drugs.   Allergies  Allergen Reactions  . Fluorescein Rash    Family History  Problem Relation Age of Onset  . Parkinsonism Father 75       deceased  . Dementia  Mother 53       deceased  . Anesthesia problems Mother   . Hypertension Brother 74       alive  . Hypertension Sister 46       alive  . Hypothyroidism Sister     Prior to Admission medications   Medication Sig Start Date End Date Taking? Authorizing Provider  aspirin EC 81 MG tablet Take 1 tablet (81 mg total) by mouth daily. 02/13/13  Yes Larey Dresser, MD  metoprolol tartrate (LOPRESSOR) 25 MG tablet Take 1 tablet (25 mg  total) by mouth 2 (two) times daily. (BETA BLOCKER) 09/25/18  Yes Burtis Junes, NP  sulfaSALAzine (AZULFIDINE) 500 MG tablet Take 2,000 mg by mouth 2 (two) times daily.    Yes [provider]  telmisartan (MICARDIS) 80 MG tablet Take 80 mg by mouth daily.  08/07/18  Yes [provider]   Physical Exam: Vitals:   11/07/18 0400 11/07/18 0430 11/07/18 0530 11/07/18 0600  BP: (!) 153/91 (!) 165/97 (!) 174/104 (!) 166/100  Pulse: 95 96 (!) 113 (!) 111  Resp: 20 (!) 23 19 19   Temp:      TempSrc:      SpO2: 94% 96% 96% 96%  Weight:      Height:         General exam: Moderately built and nourished patient, lying comfortably supine on the gurney in no obvious distress.  Head, eyes and ENT: Nontraumatic and normocephalic. Pupils equally reacting to light and accommodation. Oral mucosa moist.  Neck: Supple. No JVD, carotid bruit or thyromegaly.  Lymphatics: No lymphadenopathy.  Respiratory system: Clear to auscultation. No increased work of breathing.  Cardiovascular system: S1 and S2 heard, RRR. No JVD, murmurs, gallops, clicks or pedal edema.  Gastrointestinal system: Abdomen is distended, generalized tender, no rigidity no guarding.  Central nervous system: Alert and oriented. No focal neurological deficits.  Extremities: Symmetric 5 x 5 power. Peripheral pulses symmetrically felt.   Skin: No rashes or acute findings.  Musculoskeletal system: Negative exam.  Psychiatry: Pleasant and cooperative.   Labs on Admission:  Basic Metabolic Panel: Recent Labs  Lab 11/06/18 2159  NA 140  K 4.3  CL 105  CO2 24  GLUCOSE 130*  BUN 20  CREATININE 0.82  CALCIUM 9.6   Liver Function Tests: Recent Labs  Lab 11/06/18 2159  AST 19  ALT 14  ALKPHOS 76  BILITOT 0.9  PROT 8.1  ALBUMIN 4.7   Recent Labs  Lab 11/06/18 2159  LIPASE 37   No results for input(s): AMMONIA in the last 168 hours. CBC: Recent Labs  Lab 11/06/18 2159  WBC 8.5  HGB 12.5*   HCT 38.9*  MCV 87.6  PLT 296   Cardiac Enzymes: No results for input(s): CKTOTAL, CKMB, CKMBINDEX, TROPONINI in the last 168 hours.  BNP (last 3 results) No results for input(s): PROBNP in the last 8760 hours. CBG: No results for input(s): GLUCAP in the last 168 hours.  Radiological Exams on Admission: Ct Chest Wo Contrast  Result Date: 11/07/2018 CLINICAL DATA:  Hematemesis and possible EXAM: CT CHEST WITHOUT CONTRAST TECHNIQUE: Multidetector CT imaging of the chest was performed following the standard protocol without IV contrast. COMPARISON:  CT abdomen from 11/07/2018 and CT chest abdomen and pelvis from 12/24/2016 FINDINGS: Cardiovascular: Limited due to lack of IV contrast. Diffuse aortic calcifications are seen without aneurysmal dilatation. No cardiac enlargement is noted. Coronary calcifications are seen. Mediastinum/Nodes: Thoracic inlet is within normal limits. No  hilar or mediastinal adenopathy is identified. Lungs/Pleura: Lungs are well aerated with minimal left basilar atelectasis stable from the prior exam related to the patient's known hiatal hernia. No focal infiltrate or sizable parenchymal nodule is seen. Upper Abdomen: Large hiatal hernia is again identified with the majority of the stomach within the chest cavity. 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 Stomach remains distended with air and fluid. This may represent a degree of long-term outlet obstruction. Clinical correlation is recommended. Musculoskeletal: Postsurgical changes in the cervical spine are noted. Degenerative changes of the thoracic spine are seen. Old healed rib fractures are noted on the left. IMPRESSION: Stable appearing hiatal hernia dating back to 2018. The majority of the stomach lies within the chest cavity. Stomach remains distended with fluid which may be related to a partial gastric outlet obstruction. Organoaxial volvulus is noted as well also stable from the prior exam. Aortic  Atherosclerosis (ICD10-I70.0). Electronically Signed   By: Inez Catalina M.D.   On: 11/07/2018 07:20   Ct Abdomen Pelvis W Contrast  Result Date: 11/07/2018 CLINICAL DATA:  78 y/o M; generalized abdominal pain, nausea, and vomiting. EXAM: CT ABDOMEN AND PELVIS WITH CONTRAST TECHNIQUE: Multidetector CT imaging of the abdomen and pelvis was performed using the standard protocol following bolus administration of intravenous contrast. CONTRAST:  144m ISOVUE-300 IOPAMIDOL (ISOVUE-300) INJECTION 61% COMPARISON:  05/11/2017 CT abdomen and pelvis. FINDINGS: Lower chest: No acute abnormality. Hepatobiliary: No focal liver abnormality is seen. No gallstones, gallbladder wall thickening, or biliary dilatation. Pancreas: Unremarkable. No pancreatic ductal dilatation or surrounding inflammatory changes. Spleen: Normal in size without focal abnormality. Adrenals/Urinary Tract: Stable left adrenal nodule when measured in the coronal plane and 23 mm and on the prior study in similar fashion (series 4, image 63). Right kidney lower pole 5 mm nonobstructing stone. Multiple small stable renal cyst. No hydronephrosis or ureter stone. Normal bladder. Stomach/Bowel: Large hiatal hernia containing the majority of the stomach. Stomach is distended and fluid-filled with a portion of the gastric body below the diaphragm. The gastric antrum is at the gastroesophageal junction and the gastroesophageal junction is above the field of view. Appendix appears normal. No evidence of bowel wall thickening, distention, or inflammatory changes. Sigmoid diverticulosis without findings of acute diverticulitis. Vascular/Lymphatic: Aortic atherosclerosis. No enlarged abdominal or pelvic lymph nodes. Reproductive: Prostate is unremarkable. Other: No abdominal wall hernia or abnormality. No abdominopelvic ascites. Musculoskeletal: No fracture is seen. Multilevel degenerative changes of the lumbar spine and mild-to-moderate bilateral hip osteoarthrosis.  IMPRESSION: 1. Incompletely visualized large hiatal hernia containing the majority of the stomach which is distended. The gastric antrum is at the gastroesophageal junction and the gastroesophageal junction is above the field of view. Partial obstruction may be present either due to compression of the downward herniated gastric body and gastric volvulus is also possible. 2. Right kidney lower pole nonobstructing stone. 3. Sigmoid diverticulosis without findings of acute diverticulitis. Electronically Signed   By: LKristine GarbeM.D.   On: 11/07/2018 05:45    EKG: Independently reviewed.  Sinus tachycardia  Assessment/Plan Active Problems:   Atrial fibrillation with rapid ventricular response (HCC)   Hypertension   Ulcerative colitis (HCC)   Hyperlipidemia   Bowel obstruction (HCC)   Gastric outlet obstruction   Gastric out let obstruction   Coffee ground emesis  78year old with past medical history for hiatal hernia, A. fib ulcerative colitis who presented with abdominal pain nausea and vomiting found to have gastric outlet obstruction and volvulus.  1-Gastric Outlet obstruction, Large  hiatal hernia, Volvulus.  Admit to the stepdown unit, patient will require IV fluids, IV metoprolol. N.p.o., require NG tube placement will defer this to surgery. Surgery consulted. Continue with IV Protonix.  2-Coffee-ground emesis; in the setting of gastric outlet obstruction. IV fluids, IV Protonix. We will cycle hemoglobin every 12 hours. Transfusion as needed. Admit to  the stepdown unit.Marland Kitchen  3-Sinus Tachycardia;  IV bolus, continue with schedule IV fluids.. At risk for A. fib RVR.  Will schedule IV metoprolol every 8 hours.  4-A. Fib; Schedule IV metoprolol 5 mg every 8 hours. On aspirin prior to admission. Would avoid anticoagulation in setting of possible GI bleed.   5-Ulcerative colitis; CT abdomen with negative finding for acute flare Hold sulfasalazine  while n.p.o.  6-HTN;   IV metoprolol.  PRN Hydralazine.     DVT Prophylaxis: SCDs due to concern of coffee-ground emesis. Code Status: Partial, he does not want to be intubated. He agrees to be intubated for surgery.  Family Communication: Son was at bedside during my assessment. Disposition Plan: Admit inpatient suspect patient will need to remain in the hospital for more than 2 midnights, he will require IV fluids, IV medications.  N.p.o. and he is at risk of requiring surgery  Time spent: 75 minutes    It is my clinical opinion that admission to [facility] is reasonable and necessary in this 78 y.o. male . presenting with symptoms of abdominal pain, vomiting, concerning for strict outlet obstruction . in the context of PMH including: Prior Hiatal hernia . with pertinent positives on physical exam including: Abdominal distention, pain on palpation . and pertinent positives on radiographic and laboratory data including: T scan showing Hetal hernia and volvulus . Workup and treatment include; surgical consultation, IV fluids, IV metoprolol, n.p.o.  Might require NG tube placement.  Given the aforementioned, the predictability of an adverse outcome is felt to be significant. I expect that the patient will require at least 2 midnights in the hospital to treat this condition.  Elmarie Shiley MD [facility]  If 7PM-7AM, please contact night-coverage [email or web address] Password [email or web address]  11/07/2018, 8:09 AM

## 2018-11-08 ENCOUNTER — Inpatient Hospital Stay (HOSPITAL_COMMUNITY): Payer: Medicare Other

## 2018-11-08 DIAGNOSIS — K56691 Other complete intestinal obstruction: Secondary | ICD-10-CM

## 2018-11-08 LAB — COMPREHENSIVE METABOLIC PANEL
ALT: 89 U/L — ABNORMAL HIGH (ref 0–44)
AST: 77 U/L — ABNORMAL HIGH (ref 15–41)
Albumin: 3.2 g/dL — ABNORMAL LOW (ref 3.5–5.0)
Alkaline Phosphatase: 47 U/L (ref 38–126)
Anion gap: 8 (ref 5–15)
BUN: 17 mg/dL (ref 8–23)
CHLORIDE: 110 mmol/L (ref 98–111)
CO2: 20 mmol/L — ABNORMAL LOW (ref 22–32)
Calcium: 8.2 mg/dL — ABNORMAL LOW (ref 8.9–10.3)
Creatinine, Ser: 0.78 mg/dL (ref 0.61–1.24)
GFR calc Af Amer: >60 mL/min (ref >60)
GFR calc non Af Amer: >60 mL/min (ref >60)
Glucose, Bld: 121 mg/dL — ABNORMAL HIGH (ref 70–99)
Potassium: 3.3 mmol/L — ABNORMAL LOW (ref 3.5–5.1)
Sodium: 138 mmol/L (ref 135–145)
Total Bilirubin: 0.6 mg/dL (ref 0.3–1.2)
Total Protein: 5.8 g/dL — ABNORMAL LOW (ref 6.5–8.1)

## 2018-11-08 LAB — PHOSPHORUS: Phosphorus: <1 mg/dL — CL (ref 2.5–4.6)

## 2018-11-08 LAB — CBC
HCT: 29.8 % — ABNORMAL LOW (ref 39.0–52.0)
Hemoglobin: 9.4 g/dL — ABNORMAL LOW (ref 13.0–17.0)
MCH: 28.3 pg (ref 26.0–34.0)
MCHC: 31.5 g/dL (ref 30.0–36.0)
MCV: 89.8 fL (ref 80.0–100.0)
Platelets: 217 10*3/uL (ref 150–400)
RBC: 3.32 MIL/uL — ABNORMAL LOW (ref 4.22–5.81)
RDW: 14.5 % (ref 11.5–15.5)
WBC: 7.3 10*3/uL (ref 4.0–10.5)
nRBC: 0 % (ref 0.0–0.2)

## 2018-11-08 LAB — HEMOGLOBIN AND HEMATOCRIT, BLOOD
HCT: 33.8 % — ABNORMAL LOW (ref 39.0–52.0)
Hemoglobin: 10.4 g/dL — ABNORMAL LOW (ref 13.0–17.0)

## 2018-11-08 LAB — MAGNESIUM: MAGNESIUM: 1.5 mg/dL — AB (ref 1.7–2.4)

## 2018-11-08 LAB — PROCALCITONIN: Procalcitonin: <0.1 ng/mL

## 2018-11-08 LAB — LACTIC ACID, PLASMA: Lactic Acid, Venous: 1.4 mmol/L (ref 0.5–1.9)

## 2018-11-08 MED ORDER — MAGNESIUM SULFATE 4 GM/100ML IV SOLN
4.0000 g | Freq: Once | INTRAVENOUS | Status: AC
  Administered 2018-11-08: 4 g via INTRAVENOUS
  Filled 2018-11-08: qty 100

## 2018-11-08 MED ORDER — POTASSIUM PHOSPHATES 15 MMOLE/5ML IV SOLN
30.0000 mmol | Freq: Once | INTRAVENOUS | Status: AC
  Administered 2018-11-08: 30 mmol via INTRAVENOUS
  Filled 2018-11-08: qty 10

## 2018-11-08 MED ORDER — ORAL CARE MOUTH RINSE
15.0000 mL | Freq: Two times a day (BID) | OROMUCOSAL | Status: DC
  Administered 2018-11-08 – 2018-11-10 (×2): 15 mL via OROMUCOSAL

## 2018-11-08 MED ORDER — PHENOL 1.4 % MT LIQD
1.0000 | OROMUCOSAL | Status: DC | PRN
  Administered 2018-11-08: 1 via OROMUCOSAL
  Filled 2018-11-08: qty 177

## 2018-11-08 NOTE — Procedures (Signed)
Extubation Procedure Note  Patient Details:   Name: Daniel Dalton. DOB: Feb 28, 1941 MRN: [id number]   Airway Documentation:    Vent end date: 11/08/18 Vent end time: 0904   Evaluation  O2 sats: stable throughout Complications: No apparent complications Patient did tolerate procedure well. Bilateral Breath Sounds: Clear, Diminished   Yes  Johnette Abraham 11/08/2018, 9:04 AM

## 2018-11-08 NOTE — Progress Notes (Signed)
Rt took pt off BIPAP and placed on 15L [place].

## 2018-11-08 NOTE — Progress Notes (Signed)
[facility] Progress Note Patient Name: Daniel Dalton. DOB: 03/28/1941 MRN: [id number]   Date of Service  11/08/2018  HPI/Events of Note  Hypophos, hypomag, hypokalemia  eICU Interventions  Phos, mag and potassium replaced     Intervention Category Intermediate Interventions: Electrolyte abnormality - evaluation and management  DETERDING,ELIZABETH 11/08/2018, 4:42 AM

## 2018-11-08 NOTE — Progress Notes (Signed)
NAME:  Daniel Menz., MRN:  [id number], DOB:  Apr 10, 1941, LOS: 1 ADMISSION DATE:  11/07/2018, CONSULTATION DATE: 11/07/2018 REFERRING MD: Dr. Johney Maine, CCS, CHIEF COMPLAINT: Postop respiratory failure  Brief History   78 with paroxysmal A. fib, hypertension, sleep apnea and ulcerative colitis on sulfasalazine, known large hiatal hernia.  Admitted with abdominal pain, hematemesis, poor p.o. intake.  Continue to have pain after suction decompression.  Went for laparoscopic/robotic repair 1/29.  Noted to have a respiratory acidosis at the end of the case.  Remains intubated.  He had hypotension at induction, suspected hypovolemia due to his GI losses.  To the ICU for further care  Significant Hospital Events   To the ICU intubated 1/29 1/30: awake. Passed SBT. Ready for extubatio dizziness and pancreatitis now n.   Consults:  General surgery Cardiology  Procedures:  1/29 hiatal hernia repair, mediastinal dissection, anterior and posterior gastropexy and posterior fundoplication under general anesthesia  Significant Diagnostic Tests:  CT chest abdomen pelvis 1/29 >> hiatal hernia with majority the stomach in the chest cavity, distended with fluid that suggests partial gastric outlet obstruction, organoaxial volvulus (chronic)  Micro Data:  None  Antimicrobials:  Ceftriaxone 1/29 x 1 Flagyl 1/29  >> 1/30  Interim history/subjective:  Denies pain, passed spontaneous breathing trial  Objective   Blood pressure (Abnormal) 165/82, pulse 100, temperature (Abnormal) 97.5 F (36.4 C), temperature source Axillary, resp. rate (Abnormal) 21, height 5' 10"  (1.778 m), weight 89.8 kg, SpO2 99 %.    Vent Mode: PSV;CPAP FiO2 (%):  [30 %-100 %] 40 % Set Rate:  [24 bmp] 24 bmp Vt Set:  [580 mL] 580 mL PEEP:  [5 cmH20] 5 cmH20 Pressure Support:  [5 cmH20] 5 cmH20 Plateau Pressure:  [18 cmH20-22 cmH20] 22 cmH20   Intake/Output Summary (Last 24 hours) at 11/08/2018 0927 Last data filed at  11/08/2018 9390 Gross per 24 hour  Intake 6864.75 ml  Output 1285 ml  Net 5579.75 ml   Filed Weights   11/06/18 2148 11/07/18 1323 11/08/18 0340  Weight: 86.2 kg 86.2 kg 89.8 kg    Examination: General: This is a pleasant 78 year old white currently resting on pressure support ventilation with excellent tidal volume HEENT normocephalic atraumatic orally intubated mucous membranes moist Pulmonary: Clear to auscultation no accessory use excellent after VT ratio in the 50s no accessory use appreciated Cardiac: Regular rhythm no murmur rub or gallop Abdomen: Soft, denies tenderness.  Surgical sites unremarkable JP drain charged with minimal output Remedies: Warm and dry brisk capillary refill Neuro: Awake, follows commands, no focal deficits appreciated GU: Clear yellow.  Resolved Hospital Problem list   Acute encephalopathy (resolved 1/30) Circulatory shock (mix of hypovolemia and narcotic/anesthesia) ->resolved 1/30 Assessment & Plan:   Acute respiratory failure with respiratory acidosis Pcxr showing low vol and atelectatic changes but no infiltrate Passed SBT and had cuff leak Fully awake and hemodynamically stable  Plan Extubate IS  Wean oxygen  Mobilize  PRN CXR   Laparoscopic hiatal hernia repair Plan Cont LIWS per surg service NPO PPI BID Wound and drain care per surgical service  PPI BID   Ulcerative colitis Plan Resume sulfasalazine when can take pos    Atrial fibrillation Plan Cont tele  PRN lopressor  Resume 25 po bid when can take pos  Hypertension Plan PRNs; resume Telmisartan when can take POs     Best practice:  Diet: N.p.o. Pain/Anxiety/Delirium protocol (if indicated): Fentanyl drip, Versed as needed VAP protocol (if indicated): Ordered DVT prophylaxis:  SCD GI prophylaxis: PPI q12h Glucose control: NA Mobility: Bedrest Code Status: Full code Family Communication: None present at bedside.  Family was updated by Dr. Johney Maine postop on  1/29 Disposition: ICU.  Ready for extubation, still needs intensive care services and critical care services for titration of ventilation, extubation, and post extubation evaluation.  However I am pleased he looks like he is progressing nicely    Critical care time: 32  minutes

## 2018-11-08 NOTE — Progress Notes (Signed)
Progress Note  Patient Name: Daniel Dalton. Date of Encounter: 11/08/2018  Primary Cardiologist: Truitt Merle, NP    Subjective   No chest pain, no SOB is feeling well.    Inpatient Medications    Scheduled Meds: . chlorhexidine gluconate (MEDLINE KIT)  15 mL Mouth Rinse BID  . lip balm  1 application Topical BID  . mouth rinse  15 mL Mouth Rinse 10 times per day  . [START ON 11/10/2018] pantoprazole  40 mg Intravenous Q12H   Continuous Infusions: . dextrose 5 % and 0.9% NaCl 30 mL/hr at 11/07/18 1912  . fentaNYL infusion INTRAVENOUS Stopped (11/08/18 0735)  . ondansetron (ZOFRAN) IV    . potassium PHOSPHATE IVPB (in mmol) 83 mL/hr at 11/08/18 0833   PRN Meds: [DISCONTINUED] acetaminophen **OR** acetaminophen, bisacodyl, diphenhydrAMINE, guaiFENesin-dextromethorphan, hydrALAZINE, hydrocortisone, hydrocortisone cream, magic mouthwash, metoprolol tartrate, ondansetron (ZOFRAN) IV **OR** ondansetron (ZOFRAN) IV, ondansetron **OR** [DISCONTINUED] ondansetron (ZOFRAN) IV, prochlorperazine   Vital Signs    Vitals:   11/08/18 0700 11/08/18 0739 11/08/18 0757 11/08/18 0904  BP:      Pulse: (!) 54 62  100  Resp: (!) 24 (!) 24  (!) 21  Temp:      TempSrc:      SpO2: 99% 99% 100% 99%  Weight:      Height:        Intake/Output Summary (Last 24 hours) at 11/08/2018 1031 Last data filed at 11/08/2018 0833 Gross per 24 hour  Intake 6364.75 ml  Output 1285 ml  Net 5079.75 ml   Last 3 Weights 11/08/2018 11/07/2018 11/06/2018  Weight (lbs) 197 lb 15.6 oz 190 lb 190 lb  Weight (kg) 89.8 kg 86.183 kg 86.183 kg      Telemetry    SR to ST + PAcs but no a fib- Personally Reviewed  ECG    No new - Personally Reviewed  Physical Exam   GEN: No acute distress.   Neck: No JVD Cardiac: RRR, no murmurs, rubs, or gallops.  Respiratory: Clear to auscultation bilaterally. GI: Soft, nontender, non-distended  MS: No edema; No deformity. Neuro:  Nonfocal  Psych: Normal affect    Labs    Chemistry Recent Labs  Lab 11/06/18 2159 11/07/18 1620 11/07/18 1659 11/08/18 0300  NA 140 139 139 138  K 4.3 3.8 3.9 3.3*  CL 105  --   --  110  CO2 24  --   --  20*  GLUCOSE 130*  --   --  121*  BUN 20  --   --  17  CREATININE 0.82  --   --  0.78  CALCIUM 9.6  --   --  8.2*  PROT 8.1  --   --  5.8*  ALBUMIN 4.7  --   --  3.2*  AST 19  --   --  77*  ALT 14  --   --  89*  ALKPHOS 76  --   --  47  BILITOT 0.9  --   --  0.6  GFRNONAA >60  --   --  >60  GFRAA >60  --   --  >60  ANIONGAP 11  --   --  8     Hematology Recent Labs  Lab 11/06/18 2159 11/07/18 1449 11/07/18 1620 11/07/18 1659 11/08/18 0300  WBC 8.5 10.0  --   --  7.3  RBC 4.44 3.60*  --   --  3.32*  HGB 12.5* 9.9* 11.9* 11.2* 9.4*  HCT 38.9* 31.8* 35.0* 33.0* 29.8*  MCV 87.6 88.3  --   --  89.8  MCH 28.2 27.5  --   --  28.3  MCHC 32.1 31.1  --   --  31.5  RDW 14.1 14.2  --   --  14.5  PLT 296 240  --   --  217    Cardiac EnzymesNo results for input(s): TROPONINI in the last 168 hours. No results for input(s): TROPIPOC in the last 168 hours.   BNPNo results for input(s): BNP, PROBNP in the last 168 hours.   DDimer No results for input(s): DDIMER in the last 168 hours.   Radiology    Ct Chest Wo Contrast  Result Date: 11/07/2018 CLINICAL DATA:  Hematemesis and possible EXAM: CT CHEST WITHOUT CONTRAST TECHNIQUE: Multidetector CT imaging of the chest was performed following the standard protocol without IV contrast. COMPARISON:  CT abdomen from 11/07/2018 and CT chest abdomen and pelvis from 12/24/2016 FINDINGS: Cardiovascular: Limited due to lack of IV contrast. Diffuse aortic calcifications are seen without aneurysmal dilatation. No cardiac enlargement is noted. Coronary calcifications are seen. Mediastinum/Nodes: Thoracic inlet is within normal limits. No hilar or mediastinal adenopathy is identified. Lungs/Pleura: Lungs are well aerated with minimal left basilar atelectasis stable from the  prior exam related to the patient's known hiatal hernia. No focal infiltrate or sizable parenchymal nodule is seen. Upper Abdomen: Large hiatal hernia is again identified with the majority of the stomach within the chest cavity. 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 Stomach remains distended with air and fluid. This may represent a degree of long-term outlet obstruction. Clinical correlation is recommended. Musculoskeletal: Postsurgical changes in the cervical spine are noted. Degenerative changes of the thoracic spine are seen. Old healed rib fractures are noted on the left. IMPRESSION: Stable appearing hiatal hernia dating back to 2018. The majority of the stomach lies within the chest cavity. Stomach remains distended with fluid which may be related to a partial gastric outlet obstruction. Organoaxial volvulus is noted as well also stable from the prior exam. Aortic Atherosclerosis (ICD10-I70.0). Electronically Signed   By: Inez Catalina M.D.   On: 11/07/2018 07:20   Ct Abdomen Pelvis W Contrast  Result Date: 11/07/2018 CLINICAL DATA:  78 y/o M; generalized abdominal pain, nausea, and vomiting. EXAM: CT ABDOMEN AND PELVIS WITH CONTRAST TECHNIQUE: Multidetector CT imaging of the abdomen and pelvis was performed using the standard protocol following bolus administration of intravenous contrast. CONTRAST:  169m ISOVUE-300 IOPAMIDOL (ISOVUE-300) INJECTION 61% COMPARISON:  05/11/2017 CT abdomen and pelvis. FINDINGS: Lower chest: No acute abnormality. Hepatobiliary: No focal liver abnormality is seen. No gallstones, gallbladder wall thickening, or biliary dilatation. Pancreas: Unremarkable. No pancreatic ductal dilatation or surrounding inflammatory changes. Spleen: Normal in size without focal abnormality. Adrenals/Urinary Tract: Stable left adrenal nodule when measured in the coronal plane and 23 mm and on the prior study in similar fashion (series 4, image 63). Right kidney lower pole 5 mm  nonobstructing stone. Multiple small stable renal cyst. No hydronephrosis or ureter stone. Normal bladder. Stomach/Bowel: Large hiatal hernia containing the majority of the stomach. Stomach is distended and fluid-filled with a portion of the gastric body below the diaphragm. The gastric antrum is at the gastroesophageal junction and the gastroesophageal junction is above the field of view. Appendix appears normal. No evidence of bowel wall thickening, distention, or inflammatory changes. Sigmoid diverticulosis without findings of acute diverticulitis. Vascular/Lymphatic: Aortic atherosclerosis. No enlarged abdominal or pelvic lymph nodes. Reproductive: Prostate is  unremarkable. Other: No abdominal wall hernia or abnormality. No abdominopelvic ascites. Musculoskeletal: No fracture is seen. Multilevel degenerative changes of the lumbar spine and mild-to-moderate bilateral hip osteoarthrosis. IMPRESSION: 1. Incompletely visualized large hiatal hernia containing the majority of the stomach which is distended. The gastric antrum is at the gastroesophageal junction and the gastroesophageal junction is above the field of view. Partial obstruction may be present either due to compression of the downward herniated gastric body and gastric volvulus is also possible. 2. Right kidney lower pole nonobstructing stone. 3. Sigmoid diverticulosis without findings of acute diverticulitis. Electronically Signed   By: Kristine Garbe M.D.   On: 11/07/2018 05:45   Dg Chest Port 1 View  Result Date: 11/08/2018 CLINICAL DATA:  Acute respiratory failure with hypoxia EXAM: PORTABLE CHEST 1 VIEW COMPARISON:  Yesterday FINDINGS: Endotracheal tube tip between the clavicular heads and carina. The orogastric tube reaches the stomach. Drain overlapping the lower mediastinum. Low volume chest with streaky opacity best attributed atelectasis. No edema, effusion, or pneumothorax. Remote left rib fractures. IMPRESSION: Stable hardware  positioning and low lung volumes with atelectasis. Electronically Signed   By: Monte Fantasia M.D.   On: 11/08/2018 06:46   Dg Chest Port 1 View  Result Date: 11/07/2018 CLINICAL DATA:  Status post intubation EXAM: PORTABLE CHEST 1 VIEW COMPARISON:  11/07/2018, 11/21/2013 FINDINGS: Postsurgical changes of the cervical spine. Endotracheal tube tip is about 14 mm superior to the carina. Esophageal tube tip appears positioned over the lower chest, likely within large hiatal hernia. Bibasilar atelectasis. Stable cardiomediastinal silhouette. No pneumothorax. IMPRESSION: 1. Endotracheal tube tip about 14 mm superior to carina 2. Esophageal tube tip projects over the lower chest likely within large hiatal hernia 3. Bibasilar atelectasis Electronically Signed   By: Donavan Foil M.D.   On: 11/07/2018 19:14   Dg Chest Portable 1 View  Result Date: 11/07/2018 CLINICAL DATA:  Large hiatal hernia. History of atrial fibrillation. Abdominal pain. EXAM: PORTABLE CHEST 1 VIEW COMPARISON:  CT 11/07/2018. FINDINGS: Mediastinum and hilar structures normal. Large hiatal hernia. NG tube tip noted coiled in the hiatal hernia. Cardiomegaly with normal pulmonary vascularity. Mild basilar atelectasis. No pleural effusion or pneumothorax. No acute bony abnormality. Prior cervical spine fusion. IMPRESSION: 1. Large hiatal hernia. NG tube noted with its tip coiled in the hiatal hernia. 2. Cardiomegaly. No pulmonary venous congestion. Mild bibasilar atelectasis. Electronically Signed   By: Marcello Moores  Register   On: 11/07/2018 09:57    Cardiac Studies   Echo 11/07/18  IMPRESSIONS    1. The left ventricle appears to be normal in size, has moderate wall thickness 60-65% ejection fraction Spectral Doppler shows indeterminate pattern of diastolic filling.  2. The right ventricle has mildly enlarged size and normal systolic function.  3. Right ventricular systolic pressure is could not be assessed due to inability to visualize  IVC.  4. Mildly dilated right atrial size.  5. Mitral valve regurgitation is trivial by color flow Doppler.  6. Tricuspid regurgitation is mild.  7. There is mild sclerosis of the aortic valve without stenosis.  FINDINGS  Left Ventricle: The left ventricle appears to be normal in size, has moderate wall thickness normal systolic function with a 38-93% ejection fraction Spectral Doppler shows indeterminate pattern of diastolic filling. The left ventricular diastology  could not be evaluateddue to indeterminent diastolic function. Right Ventricle: The right ventricle is mildly enlarged in size normal wall thickness has normal systolic function. Left Atrium: The left atrium was not well visualized. The left  atrium is normal in size. Right Atrium: The right atrium was not well visualized. The right atrial size is mildly dilated. Interatrial Septum: The interatrial septum was not assessed.  Pericardium: There is no evidence of pericardial effusion. Mitral Valve: The mitral valve normal in structure and function. Mitral valve regurgitation is trivial by color flow Doppler. Tricuspid Valve: The tricuspid valve is normal in structure. Tricuspid regurgitation is mild by color flow Doppler. Aortic Valve: The aortic valve tricuspid. There is mild sclerosis of the aortic valve. Pulmonic Valve: The pulmonic valve was not well visualized. The pulmonic valve is grossly normal. Pulmonic valve regurgitation is trivial by color flow Doppler. Venous: The inferior vena cava was not well visualized.     Patient Profile     78 y.o. 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  Now Post op.    Assessment & Plan    Post op day #1 incarcerated hernia, with mesenteroaxial gastric volvulus, gastric outlet obstruction and gastritis.  Per CCS, NG continues.   Acute respiratory failure  with respiratory acidosis at end of case, Pulmonary following now extubated.  On oxygen CXR with atelectasis  PAF maintaining SR.  Not on anticoagulation - pt has refused in past.  Normal EF.   HTN continue meds  Hypokalemia with k+ 3.3, Mg+ is low at 1.5  Being replaced.          For questions or updates, please contact [place] Please consult [email or web address] for contact info under        Signed, Cecilie Kicks, NP  11/08/2018, 10:31 AM

## 2018-11-08 NOTE — Progress Notes (Signed)
[address] [place] [id number] Nov 19, 1940  CARE TEAM:  PCP: Prince Solian, MD  Outpatient Care Team: Patient Care Team: Prince Solian, MD as PCP - General (Internal Medicine) Burtis Junes, NP as PCP - Cardiology (Nurse Practitioner) Clarene Essex, MD as Consulting Physician (Gastroenterology) Michael Boston, MD as Consulting Physician (General Surgery)  Inpatient Treatment Team: Treatment Team: Attending Provider: Donne Hazel, MD; Consulting Physician: Nolon Nations, MD; Consulting Physician: Lbcardiology, Michae Kava, MD; Consulting Physician: Michael Boston, MD; Consulting Physician: Pccm, Md, MD; Consulting Physician: Collene Gobble, MD; Registered Nurse: Cyndie Chime, RN; Rounding Team: Garner Gavel, MD; Registered Nurse: Campbell Lerner, RN   Problem List:   Principal Problem:   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 ROBOTIC reduction of paraesophageal hiatal hernia 2. Type II mediastinal dissection. 3. Primary repair of hiatal hernia over pledgets.  4. Anterior & posterior gastropexy. 5. Toupet (partial 240 degree posterior) fundoplication x 3.5 cm 6. EGD  SURGEON:  Adin Hector, MD    Assessment  Recovering rather well so far  [facility] Stay = 1 days)  Plan:  -Extubation this morning.  He is alert and strong.  According to ICU  team, he is flying well with weaning parameters.  Hopefully this morning.  Nasogastric tube to low intermittent wall suction.  If residuals low, consider clamping trial with clear liquids tomorrow.  I will go slowly since he has had increased risk for gastric ileus given very dilated stomach and chronically incarcerated twisted stomach  Aggressively in place electrolytes with his hypokalemia, hypomagnesemia, hypophosphatemia.  If does well, perhaps can be transferred to floor tomorrow.  Defer to critical care/primary medicine team.  VTE prophylaxis- SCDs, etc  mobilize as tolerated to help recovery  20 minutes spent in review, evaluation, examination, counseling, and coordination of care.  More than 50% of that time was spent in counseling.  11/08/2018    Subjective: (Chief complaint)  Remains intubated.  No major events.  Alert and smiling.  Shook my hand.  Wants to get extubated.  Daughter in room.  ICU nursing and respiratory therapy in room.  Objective:  Vital signs:  Vitals:   11/08/18 0400 11/08/18 0500 11/08/18 0600 11/08/18 0700  BP:      Pulse: (!) 53 (!) 57 61 (!) 54  Resp: (!) 24 19 (!) 24 (!) 24  Temp: (!) 97.5 F (36.4 C)     TempSrc: Axillary     SpO2: 100% 99% 99% 99%  Weight:      Height:           Intake/Output   Yesterday:  01/29 0701 - 01/30 0700 In: 6428.2 [I.V.:4027.4; IV Piggyback:2400.7] Out: 1260 [Urine:1175; Emesis/NG output:60; Drains:25] This shift:  Total I/O In: -  Out: 25 [Drains:25]  Bowel function:  Flatus: No  BM:  No  NGT: Nasogastric tube with scant thick and bilious output.  [place] surgical drain goes into mediastinum: Serosanguineous thin output   Physical  Exam:  General: Pt awake/alert/oriented x4 in acute distress.  Alert.  Shaking my hand.  Intubated still. Eyes: PERRL, normal EOM.  Sclera clear.  No icterus Neuro: CN II-XII intact w/o focal sensory/motor deficits. Lymph: No head/neck/groin  lymphadenopathy Psych:  No delerium/psychosis/paranoia HENT: Normocephalic, Mucus membranes moist.  No thrush Neck: Supple, No tracheal deviation Chest: No chest wall pain w good excursion CV:  Pulses intact.  Regular rhythm MS: Normal AROM mjr joints.  No obvious deformity  Abdomen: Soft.  Nondistended.  Mildly tender at incisions only.  No evidence of peritonitis.  No incarcerated hernias.  Ext:  No deformity.  No mjr edema.  No cyanosis Skin: No petechiae / purpura  Results:   Labs: Results for orders placed or performed during the hospital encounter of 11/07/18 (from the past 48 hour(s))  Lipase, blood     Status: None   Collection Time: 11/06/18  9:59 PM  Result Value Ref Range   Lipase 37 11 - 51 U/L    Comment: Performed at [facility], [place] [address], [place], [place] [address]  Comprehensive metabolic panel     Status: Abnormal   Collection Time: 11/06/18  9:59 PM  Result Value Ref Range   Sodium 140 135 - 145 mmol/L   Potassium 4.3 3.5 - 5.1 mmol/L   Chloride 105 98 - 111 mmol/L   CO2 24 22 - 32 mmol/L   Glucose, Bld 130 (H) 70 - 99 mg/dL   BUN 20 8 - 23 mg/dL   Creatinine, Ser 0.82 0.61 - 1.24 mg/dL   Calcium 9.6 8.9 - 10.3 mg/dL   Total Protein 8.1 6.5 - 8.1 g/dL   Albumin 4.7 3.5 - 5.0 g/dL   AST 19 15 - 41 U/L   ALT 14 0 - 44 U/L   Alkaline Phosphatase 76 38 - 126 U/L   Total Bilirubin 0.9 0.3 - 1.2 mg/dL   GFR calc non Af Amer >60 >60 mL/min   GFR calc Af Amer >60 >60 mL/min   Anion gap 11 5 - 15    Comment: Performed at [facility], [place] [address], [place], [place] [address]  CBC     Status: Abnormal   Collection Time: 11/06/18  9:59 PM  Result Value Ref Range   WBC 8.5 4.0 - 10.5 K/uL   RBC 4.44 4.22 - 5.81 MIL/uL   Hemoglobin 12.5 (L) 13.0 - 17.0 g/dL   HCT 38.9 (L) 39.0 - 52.0 %   MCV 87.6 80.0 - 100.0 fL   MCH 28.2 26.0 - 34.0 pg   MCHC 32.1 30.0 - 36.0 g/dL   RDW 14.1 11.5 - 15.5 %   Platelets 296 150 -  400 K/uL   nRBC 0.0 0.0 - 0.2 %    Comment: Performed at [facility], [place] [address] [place], [place] [address]  Urinalysis, Routine w reflex microscopic     Status: Abnormal   Collection Time: 11/06/18 11:07 PM  Result Value Ref Range   Color, Urine AMBER (A) YELLOW    Comment: BIOCHEMICALS MAY BE AFFECTED BY COLOR   APPearance CLEAR CLEAR   Specific Gravity, Urine 1.024 1.005 - 1.030   pH 5.0 5.0 - 8.0   Glucose, UA NEGATIVE NEGATIVE mg/dL   Hgb urine dipstick NEGATIVE NEGATIVE   Bilirubin Urine NEGATIVE NEGATIVE   Ketones, ur 20 (A) NEGATIVE mg/dL   Protein, ur 100 (A) NEGATIVE mg/dL   Nitrite NEGATIVE NEGATIVE   Leukocytes,  UA NEGATIVE NEGATIVE   RBC / HPF 0-5 0 - 5 RBC/hpf   WBC, UA 0-5 0 - 5 WBC/hpf   Bacteria, UA NONE SEEN NONE SEEN   Squamous Epithelial / LPF 0-5 0 - 5   Mucus PRESENT    Hyaline Casts, UA PRESENT     Comment: Performed at [facility], [place] [address] [place], [place] [address]  Occult bld gastric/duodenum (cup to lab)     Status: Abnormal   Collection Time: 11/07/18  4:44 AM  Result Value Ref Range   pH, Gastric 4    Occult Blood, Gastric POSITIVE (A) NEGATIVE    Comment: Performed at [facility], [place] [address] [place], [place] [address]  Type and screen     Status: None   Collection Time: 11/07/18  5:17 AM  Result Value Ref Range   ABO/RH(D) A POS    Antibody Screen NEG    Sample Expiration      11/10/2018 Performed at [facility], [place] [address], [place], [place] [address]   Protime-INR     Status: None   Collection Time: 11/07/18  5:17 AM  Result Value Ref Range   Prothrombin Time 12.8 11.4 - 15.2 seconds   INR 0.97     Comment: Performed at [facility], [place] [address] [place], [place] [address]  ABO/Rh     Status: None   Collection Time: 11/07/18  5:17 AM  Result Value Ref Range   ABO/RH(D)      A POS Performed at [facility], [place] [address] [place], [place] [address]   Lactic acid, plasma     Status: None   Collection Time: 11/07/18 11:32 AM  Result Value Ref Range   Lactic Acid, Venous 1.2 0.5 - 1.9 mmol/L    Comment: Performed at [facility], [place] [address], [place], [place] [address]  CBC     Status: Abnormal   Collection Time: 11/07/18  2:49 PM  Result Value Ref Range   WBC 10.0 4.0 - 10.5 K/uL   RBC 3.60 (L) 4.22 - 5.81 MIL/uL   Hemoglobin 9.9 (L) 13.0 - 17.0 g/dL   HCT 31.8 (L) 39.0 - 52.0 %   MCV 88.3 80.0 - 100.0 fL   MCH 27.5 26.0 - 34.0 pg   MCHC 31.1 30.0 - 36.0 g/dL   RDW 14.2 11.5 - 15.5 %   Platelets 240 150 - 400 K/uL   nRBC 0.0 0.0 - 0.2 %    Comment: Performed at [facility], [place] [address], [place], [place] [address]  I-STAT 7, (LYTES, BLD GAS, ICA, H+H)     Status: Abnormal   Collection Time: 11/07/18  4:20 PM  Result Value Ref Range   pH, Arterial 7.167 (LL) 7.350 - 7.450   pCO2 arterial 64.6 (H) 32.0 - 48.0 mmHg   pO2, Arterial 96.0 83.0 - 108.0 mmHg   Bicarbonate 23.6 20.0 - 28.0 mmol/L   TCO2 26 22 - 32 mmol/L   O2 Saturation 95.0 %   Acid-base deficit 6.0 (H) 0.0 - 2.0 mmol/L   Sodium 139 135 - 145 mmol/L   Potassium 3.8 3.5 - 5.1 mmol/L   Calcium, Ion 1.25 1.15 - 1.40 mmol/L   HCT 35.0 (L) 39.0 - 52.0 %   Hemoglobin 11.9 (L) 13.0 - 17.0 g/dL   Patient temperature 36.2 C    Sample type ARTERIAL   I-STAT 7, (LYTES,  BLD GAS, ICA, H+H)     Status: Abnormal   Collection Time: 11/07/18  4:59 PM  Result Value Ref Range   pH, Arterial 7.223 (L) 7.350 - 7.450   pCO2 arterial 53.3 (H) 32.0 - 48.0 mmHg   pO2, Arterial 88.0 83.0 - 108.0 mmHg   Bicarbonate 22.3 20.0 - 28.0 mmol/L   TCO2 24 22 - 32 mmol/L   O2 Saturation 95.0 %   Acid-base deficit 6.0 (H) 0.0 - 2.0 mmol/L   Sodium 139 135 - 145 mmol/L   Potassium 3.9 3.5 - 5.1 mmol/L   Calcium, Ion 1.21 1.15 - 1.40 mmol/L   HCT 33.0 (L) 39.0 - 52.0 %   Hemoglobin 11.2 (L)  13.0 - 17.0 g/dL   Patient temperature 36.0 C    Sample type ARTERIAL   Lactic acid, plasma     Status: None   Collection Time: 11/07/18  6:58 PM  Result Value Ref Range   Lactic Acid, Venous 1.9 0.5 - 1.9 mmol/L    Comment: Performed at [facility], [place] [address] [place], [place] [address]  Procalcitonin - Baseline     Status: None   Collection Time: 11/07/18  6:58 PM  Result Value Ref Range   Procalcitonin <0.10 ng/mL    Comment:        Interpretation: PCT (Procalcitonin) <= 0.5 ng/mL: Systemic infection (sepsis) is not likely. Local bacterial infection is possible. (NOTE)       Sepsis PCT Algorithm           Lower Respiratory Tract                                      Infection PCT Algorithm    ----------------------------     ----------------------------         PCT < 0.25 ng/mL                PCT < 0.10 ng/mL         Strongly encourage             Strongly discourage   discontinuation of antibiotics    initiation of antibiotics    ----------------------------     -----------------------------       PCT 0.25 - 0.50 ng/mL            PCT 0.10 - 0.25 ng/mL               OR       >80% decrease in PCT            Discourage initiation of                                            antibiotics      Encourage discontinuation           of antibiotics    ----------------------------     -----------------------------         PCT >= 0.50 ng/mL              PCT 0.26 - 0.50 ng/mL               AND        <80% decrease in PCT             Encourage  initiation of                                             antibiotics       Encourage continuation           of antibiotics    ----------------------------     -----------------------------        PCT >= 0.50 ng/mL                  PCT > 0.50 ng/mL               AND         increase in PCT                  Strongly encourage                                      initiation of antibiotics    Strongly encourage  escalation           of antibiotics                                     -----------------------------                                           PCT <= 0.25 ng/mL                                                 OR                                        > 80% decrease in PCT                                     Discontinue / Do not initiate                                             antibiotics Performed at [place] [address] [place], [place] [address]   Triglycerides     Status: None   Collection Time: 11/07/18  7:08 PM  Result Value Ref Range   Triglycerides 21 <150 mg/dL    Comment: Performed at [facility], [place] [address], [place],  [address]  Blood gas, arterial     Status: Abnormal   Collection Time: 11/07/18  8:00 PM  Result Value Ref Range   FIO2 100.00    Delivery systems VENTILATOR    Mode PRESSURE REGULATED VOLUME CONTROL    VT 580 mL   LHR 24 resp/min   Peep/cpap 5.0 cm H20   pH, Arterial 7.465 (H) 7.350 - 7.450  pCO2 arterial 32.3 32.0 - 48.0 mmHg   pO2, Arterial 402 (H) 83.0 - 108.0 mmHg   Bicarbonate 22.9 20.0 - 28.0 mmol/L   Acid-Base Excess 0.1 0.0 - 2.0 mmol/L   O2 Saturation 98.7 %   Patient temperature 98.6    Collection site A-LINE    Drawn by [id number]    Sample type ARTERIAL DRAW     Comment: Performed at [facility], [place] [address], [place], [place] [address]  Comprehensive metabolic panel     Status: Abnormal   Collection Time: 11/08/18  3:00 AM  Result Value Ref Range   Sodium 138 135 - 145 mmol/L   Potassium 3.3 (L) 3.5 - 5.1 mmol/L   Chloride 110 98 - 111 mmol/L   CO2 20 (L) 22 - 32 mmol/L   Glucose, Bld 121 (H) 70 - 99 mg/dL   BUN 17 8 - 23 mg/dL   Creatinine, Ser 0.78 0.61 - 1.24 mg/dL   Calcium 8.2 (L) 8.9 - 10.3 mg/dL   Total Protein 5.8 (L) 6.5 - 8.1 g/dL   Albumin 3.2 (L) 3.5 - 5.0 g/dL   AST 77 (H) 15 - 41 U/L   ALT 89 (H) 0 - 44 U/L   Alkaline Phosphatase 47  38 - 126 U/L   Total Bilirubin 0.6 0.3 - 1.2 mg/dL   GFR calc non Af Amer >60 >60 mL/min   GFR calc Af Amer >60 >60 mL/min   Anion gap 8 5 - 15    Comment: Performed at [facility], [place] [address] [place], [place] [address]  CBC     Status: Abnormal   Collection Time: 11/08/18  3:00 AM  Result Value Ref Range   WBC 7.3 4.0 - 10.5 K/uL   RBC 3.32 (L) 4.22 - 5.81 MIL/uL   Hemoglobin 9.4 (L) 13.0 - 17.0 g/dL   HCT 29.8 (L) 39.0 - 52.0 %   MCV 89.8 80.0 - 100.0 fL   MCH 28.3 26.0 - 34.0 pg   MCHC 31.5 30.0 - 36.0 g/dL   RDW 14.5 11.5 - 15.5 %   Platelets 217 150 - 400 K/uL   nRBC 0.0 0.0 - 0.2 %    Comment: Performed at [facility], [place] [address] [place], [place] [address]  Magnesium     Status: Abnormal   Collection Time: 11/08/18  3:00 AM  Result Value Ref Range   Magnesium 1.5 (L) 1.7 - 2.4 mg/dL    Comment: Performed at [facility], [place] [address] [place], [place] [address]  Phosphorus     Status: Abnormal   Collection Time: 11/08/18  3:00 AM  Result Value Ref Range   Phosphorus <1.0 (LL) 2.5 - 4.6 mg/dL    Comment: CRITICAL RESULT CALLED TO, READ BACK BY AND VERIFIED WITH: RN ZOE AT 4818 11/08/18 CRUICKSHANK A Performed at [facility], [place] [address], [place], [place] [address]   Lactic acid, plasma     Status: None   Collection Time: 11/08/18  3:00 AM  Result Value Ref Range   Lactic Acid, Venous 1.4 0.5 - 1.9 mmol/L    Comment: Performed at [facility], [place] [address], [place], [place] [address]  Procalcitonin     Status: None   Collection Time: 11/08/18  3:00 AM  Result Value Ref Range   Procalcitonin <0.10 ng/mL    Comment:        Interpretation: PCT (Procalcitonin) <= 0.5  ng/mL: Systemic infection (sepsis) is not likely. Local bacterial infection is possible. (NOTE)       Sepsis PCT Algorithm           Lower Respiratory Tract                                       Infection PCT Algorithm    ----------------------------     ----------------------------         PCT < 0.25 ng/mL                PCT < 0.10 ng/mL         Strongly encourage             Strongly discourage   discontinuation of antibiotics    initiation of antibiotics    ----------------------------     -----------------------------       PCT 0.25 - 0.50 ng/mL            PCT 0.10 - 0.25 ng/mL               OR       >80% decrease in PCT            Discourage initiation of                                            antibiotics      Encourage discontinuation           of antibiotics    ----------------------------     -----------------------------         PCT >= 0.50 ng/mL              PCT 0.26 - 0.50 ng/mL               AND        <80% decrease in PCT             Encourage initiation of                                             antibiotics       Encourage continuation           of antibiotics    ----------------------------     -----------------------------        PCT >= 0.50 ng/mL                  PCT > 0.50 ng/mL               AND         increase in PCT                  Strongly encourage                                      initiation of antibiotics    Strongly encourage escalation           of antibiotics                                     -----------------------------  PCT <= 0.25 ng/mL                                                 OR                                        > 80% decrease in PCT                                     Discontinue / Do not initiate                                             antibiotics Performed at [place] [address] [place], [place] [address]     Imaging / Studies: Ct Chest Wo Contrast  Result Date: 11/07/2018 CLINICAL DATA:  Hematemesis and possible EXAM: CT CHEST WITHOUT CONTRAST TECHNIQUE: Multidetector CT imaging of the chest was performed following the standard  protocol without IV contrast. COMPARISON:  CT abdomen from 11/07/2018 and CT chest abdomen and pelvis from 12/24/2016 FINDINGS: Cardiovascular: Limited due to lack of IV contrast. Diffuse aortic calcifications are seen without aneurysmal dilatation. No cardiac enlargement is noted. Coronary calcifications are seen. Mediastinum/Nodes: Thoracic inlet is within normal limits. No hilar or mediastinal adenopathy is identified. Lungs/Pleura: Lungs are well aerated with minimal left basilar atelectasis stable from the prior exam related to the patient's known hiatal hernia. No focal infiltrate or sizable parenchymal nodule is seen. Upper Abdomen: Large hiatal hernia is again identified with the majority of the stomach within the chest cavity. 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 Stomach remains distended with air and fluid. This may represent a degree of long-term outlet obstruction. Clinical correlation is recommended. Musculoskeletal: Postsurgical changes in the cervical spine are noted. Degenerative changes of the thoracic spine are seen. Old healed rib fractures are noted on the left. IMPRESSION: Stable appearing hiatal hernia dating back to 2018. The majority of the stomach lies within the chest cavity. Stomach remains distended with fluid which may be related to a partial gastric outlet obstruction. Organoaxial volvulus is noted as well also stable from the prior exam. Aortic Atherosclerosis (ICD10-I70.0). Electronically Signed   By: Inez Catalina M.D.   On: 11/07/2018 07:20   Ct Abdomen Pelvis W Contrast  Result Date: 11/07/2018 CLINICAL DATA:  78 y/o M; generalized abdominal pain, nausea, and vomiting. EXAM: CT ABDOMEN AND PELVIS WITH CONTRAST TECHNIQUE: Multidetector CT imaging of the abdomen and pelvis was performed using the standard protocol following bolus administration of intravenous contrast. CONTRAST:  181m ISOVUE-300 IOPAMIDOL (ISOVUE-300) INJECTION 61% COMPARISON:  05/11/2017 CT  abdomen and pelvis. FINDINGS: Lower chest: No acute abnormality. Hepatobiliary: No focal liver abnormality is seen. No gallstones, gallbladder wall thickening, or biliary dilatation. Pancreas: Unremarkable. No pancreatic ductal dilatation or surrounding inflammatory changes. Spleen: Normal in size without focal abnormality. Adrenals/Urinary Tract: Stable left adrenal nodule when measured in the coronal plane and 23 mm and on the prior study in similar fashion (series 4, image 63). Right kidney lower pole 5 mm nonobstructing stone. Multiple small  stable renal cyst. No hydronephrosis or ureter stone. Normal bladder. Stomach/Bowel: Large hiatal hernia containing the majority of the stomach. Stomach is distended and fluid-filled with a portion of the gastric body below the diaphragm. The gastric antrum is at the gastroesophageal junction and the gastroesophageal junction is above the field of view. Appendix appears normal. No evidence of bowel wall thickening, distention, or inflammatory changes. Sigmoid diverticulosis without findings of acute diverticulitis. Vascular/Lymphatic: Aortic atherosclerosis. No enlarged abdominal or pelvic lymph nodes. Reproductive: Prostate is unremarkable. Other: No abdominal wall hernia or abnormality. No abdominopelvic ascites. Musculoskeletal: No fracture is seen. Multilevel degenerative changes of the lumbar spine and mild-to-moderate bilateral hip osteoarthrosis. IMPRESSION: 1. Incompletely visualized large hiatal hernia containing the majority of the stomach which is distended. The gastric antrum is at the gastroesophageal junction and the gastroesophageal junction is above the field of view. Partial obstruction may be present either due to compression of the downward herniated gastric body and gastric volvulus is also possible. 2. Right kidney lower pole nonobstructing stone. 3. Sigmoid diverticulosis without findings of acute diverticulitis. Electronically Signed   By: Kristine Garbe M.D.   On: 11/07/2018 05:45   Dg Chest Port 1 View  Result Date: 11/08/2018 CLINICAL DATA:  Acute respiratory failure with hypoxia EXAM: PORTABLE CHEST 1 VIEW COMPARISON:  Yesterday FINDINGS: Endotracheal tube tip between the clavicular heads and carina. The orogastric tube reaches the stomach. Drain overlapping the lower mediastinum. Low volume chest with streaky opacity best attributed atelectasis. No edema, effusion, or pneumothorax. Remote left rib fractures. IMPRESSION: Stable hardware positioning and low lung volumes with atelectasis. Electronically Signed   By: Monte Fantasia M.D.   On: 11/08/2018 06:46   Dg Chest Port 1 View  Result Date: 11/07/2018 CLINICAL DATA:  Status post intubation EXAM: PORTABLE CHEST 1 VIEW COMPARISON:  11/07/2018, 11/21/2013 FINDINGS: Postsurgical changes of the cervical spine. Endotracheal tube tip is about 14 mm superior to the carina. Esophageal tube tip appears positioned over the lower chest, likely within large hiatal hernia. Bibasilar atelectasis. Stable cardiomediastinal silhouette. No pneumothorax. IMPRESSION: 1. Endotracheal tube tip about 14 mm superior to carina 2. Esophageal tube tip projects over the lower chest likely within large hiatal hernia 3. Bibasilar atelectasis Electronically Signed   By: Donavan Foil M.D.   On: 11/07/2018 19:14   Dg Chest Portable 1 View  Result Date: 11/07/2018 CLINICAL DATA:  Large hiatal hernia. History of atrial fibrillation. Abdominal pain. EXAM: PORTABLE CHEST 1 VIEW COMPARISON:  CT 11/07/2018. FINDINGS: Mediastinum and hilar structures normal. Large hiatal hernia. NG tube tip noted coiled in the hiatal hernia. Cardiomegaly with normal pulmonary vascularity. Mild basilar atelectasis. No pleural effusion or pneumothorax. No acute bony abnormality. Prior cervical spine fusion. IMPRESSION: 1. Large hiatal hernia. NG tube noted with its tip coiled in the hiatal hernia. 2. Cardiomegaly. No pulmonary venous  congestion. Mild bibasilar atelectasis. Electronically Signed   By: Marcello Moores  Register   On: 11/07/2018 09:57    Medications / Allergies: per chart  Antibiotics: Anti-infectives (From admission, onward)   Start     Dose/Rate Route Frequency Ordered Stop   11/07/18 2100  metroNIDAZOLE (FLAGYL) IVPB 500 mg     500 mg 100 mL/hr over 60 Minutes Intravenous Every 6 hours 11/07/18 1850 11/08/18 1459   11/07/18 1230  metroNIDAZOLE (FLAGYL) IVPB 500 mg     500 mg 100 mL/hr over 60 Minutes Intravenous On call to O.R. 11/07/18 1108 11/07/18 1458   11/07/18 1215  cefTRIAXone (ROCEPHIN) 2 g  in sodium chloride 0.9 % 100 mL IVPB     2 g 200 mL/hr over 30 Minutes Intravenous On call to O.R. 11/07/18 1108 11/07/18 1513        Note: Portions of this report may have been transcribed using voice recognition software. Every effort was made to ensure accuracy; however, inadvertent computerized transcription errors may be present.   Any transcriptional errors that result from this process are unintentional.     Adin Hector, MD, FACS, MASCRS [facility]astrointestinal and [facility]ally In[facility] Surgery    [address], [place], [place] [address] [phone] Main / Paging [phone] Fax

## 2018-11-08 NOTE — Addendum Note (Signed)
Addendum  created 11/08/18 0953 by Lollie Sails, CRNA   Charge Capture section accepted

## 2018-11-09 ENCOUNTER — Ambulatory Visit (HOSPITAL_COMMUNITY): Payer: Medicare Other

## 2018-11-09 DIAGNOSIS — I471 Supraventricular tachycardia: Secondary | ICD-10-CM

## 2018-11-09 DIAGNOSIS — Z01818 Encounter for other preprocedural examination: Secondary | ICD-10-CM

## 2018-11-09 DIAGNOSIS — E7800 Pure hypercholesterolemia, unspecified: Secondary | ICD-10-CM

## 2018-11-09 DIAGNOSIS — J9601 Acute respiratory failure with hypoxia: Secondary | ICD-10-CM

## 2018-11-09 LAB — BASIC METABOLIC PANEL
Anion gap: 7 (ref 5–15)
BUN: 14 mg/dL (ref 8–23)
CHLORIDE: 110 mmol/L (ref 98–111)
CO2: 22 mmol/L (ref 22–32)
Calcium: 8 mg/dL — ABNORMAL LOW (ref 8.9–10.3)
Creatinine, Ser: 0.75 mg/dL (ref 0.61–1.24)
GFR calc Af Amer: >60 mL/min (ref >60)
GFR calc non Af Amer: >60 mL/min (ref >60)
Glucose, Bld: 120 mg/dL — ABNORMAL HIGH (ref 70–99)
Potassium: 3.8 mmol/L (ref 3.5–5.1)
Sodium: 139 mmol/L (ref 135–145)

## 2018-11-09 LAB — PROCALCITONIN: Procalcitonin: <0.1 ng/mL

## 2018-11-09 LAB — PHOSPHORUS: PHOSPHORUS: 2 mg/dL — AB (ref 2.5–4.6)

## 2018-11-09 LAB — MAGNESIUM: Magnesium: 1.8 mg/dL (ref 1.7–2.4)

## 2018-11-09 LAB — HEMOGLOBIN AND HEMATOCRIT, BLOOD
HCT: 33.3 % — ABNORMAL LOW (ref 39.0–52.0)
HCT: 33.5 % — ABNORMAL LOW (ref 39.0–52.0)
Hemoglobin: 10.4 g/dL — ABNORMAL LOW (ref 13.0–17.0)
Hemoglobin: 10.5 g/dL — ABNORMAL LOW (ref 13.0–17.0)

## 2018-11-09 MED ORDER — METOPROLOL TARTRATE 5 MG/5ML IV SOLN: 2.5000 mg | Freq: Four times a day (QID) | INTRAVENOUS | Status: DC

## 2018-11-09 MED ORDER — METOPROLOL TARTRATE 5 MG/5ML IV SOLN
5.0000 mg | Freq: Four times a day (QID) | INTRAVENOUS | Status: DC | PRN
  Administered 2018-11-09 – 2018-11-10 (×2): 5 mg via INTRAVENOUS
  Filled 2018-11-09 (×2): qty 5

## 2018-11-09 MED ORDER — MAGNESIUM SULFATE 2 GM/50ML IV SOLN
2.0000 g | Freq: Once | INTRAVENOUS | Status: AC
  Administered 2018-11-09: 2 g via INTRAVENOUS
  Filled 2018-11-09: qty 50

## 2018-11-09 MED ORDER — MAGIC MOUTHWASH
15.0000 mL | Freq: Three times a day (TID) | ORAL | Status: DC
  Administered 2018-11-09 (×3): 15 mL via ORAL
  Filled 2018-11-09 (×4): qty 15

## 2018-11-09 MED ORDER — METOPROLOL TARTRATE 5 MG/5ML IV SOLN
5.0000 mg | Freq: Four times a day (QID) | INTRAVENOUS | Status: DC
  Administered 2018-11-09 – 2018-11-10 (×4): 5 mg via INTRAVENOUS
  Filled 2018-11-09 (×4): qty 5

## 2018-11-09 MED ORDER — POTASSIUM PHOSPHATES 15 MMOLE/5ML IV SOLN
20.0000 mmol | Freq: Once | INTRAVENOUS | Status: AC
  Administered 2018-11-09: 20 mmol via INTRAVENOUS
  Filled 2018-11-09: qty 6.67

## 2018-11-09 NOTE — Progress Notes (Signed)
[facility] Progress Note Patient Name: Daniel Dalton. DOB: June 28, 1941 MRN: [id number]   Date of Service  11/09/2018  HPI/Events of Note  Notified of several episodes of tachycardia in the 150s. Noted several electrolyte abnormalities yesterday that were corrected.  eICU Interventions  Will recheck electrolytes now and correct if needed     Intervention Category Major Interventions: Arrhythmia - evaluation and management  Judd Lien 11/09/2018, 1:58 AM

## 2018-11-09 NOTE — Progress Notes (Signed)
A-line was removed with no complications.

## 2018-11-09 NOTE — Progress Notes (Signed)
PROGRESS NOTE    Daniel Dalton.  BEM:[id number] DOB: 1940-11-11 DOA: 11/07/2018 PCP: Prince Solian, MD    Brief Narrative:  78 y.o. male with PMH significant for A fib on aspirin, Ulcerative colitis on Sulfasalazine, hiatal hernia who presents with abdominal pain, pain is generalized that started after supper the night prior to admission. He describe pain as severe, constant, is not sharp, 10/10. Pain is located mid abdomen. He relates pain is associated with nausea and vomiting. He has vomited twice, one time in the ED. He vomited coffee ground emesis in the ED.  He report pain medication has decrease intensity if pain.  Patient currently denies chest pain, shortness of breath, cough, melena.   Assessment & Plan:   Principal Problem:   Mesenteroaxial gastric volvulus with obstruction s/p robotic reduction/repair/partial fundoplication 0/71/2197 Active Problems:   Atrial fibrillation with rapid ventricular response (HCC)   OSA (obstructive sleep apnea)   HTN (hypertension)   Ulcerative colitis ([facility])   Hyperlipidemia   Gastric outlet obstruction   Coffee ground emesis   Incarcerated hiatal hernia   S/P robotic partial posterior Toupet fundoplication 5/88/3254   Acute respiratory failure with hypoxia (HCC)   1-Gastric Outlet obstruction, Large hiatal hernia, Volvulus.  Surgery consulted and is following Pt now s/p surgery 1/29 NG in place, clamped with trials of clears Continued on protonix  2-Coffee-ground emesis; in the setting of gastric outlet obstruction. Recheck CBC in AM Transfusion as needed.  3-Sinus Tachycardia;  Agree with continuation of IV metoprolol until pt tolerates PO reliably Cardiology following, recommendation for resuming PO metoprolol 15m when able to  4-A. Fib; Continued on IV beta blocker Stable at present Plan to resume PO beta blocker when able to tolerate PO reliably Continue on tele   5-Ulcerative colitis; CT abdomen with  negative finding for acute flare Hold sulfasalazine until able to tolerate PO  6-HTN;  IV metoprolol continued PRN Hydralazine as needed  DVT prophylaxis: SCD's Code Status: No intubation Family Communication: Pt in room, family not at bedside Disposition Plan: Uncertain at this time  Consultants:   General surery  Procedures:   Surgical repair of incarcerated hernia 1/29  Antimicrobials: Anti-infectives (From admission, onward)   Start     Dose/Rate Route Frequency Ordered Stop   11/07/18 2100  metroNIDAZOLE (FLAGYL) IVPB 500 mg     500 mg 100 mL/hr over 60 Minutes Intravenous Every 6 hours 11/07/18 1850 11/08/18 1017   11/07/18 1230  metroNIDAZOLE (FLAGYL) IVPB 500 mg     500 mg 100 mL/hr over 60 Minutes Intravenous On call to O.R. 11/07/18 1108 11/07/18 1458   11/07/18 1215  cefTRIAXone (ROCEPHIN) 2 g in sodium chloride 0.9 % 100 mL IVPB     2 g 200 mL/hr over 30 Minutes Intravenous On call to O.R. 11/07/18 1108 11/07/18 1513       Subjective: Reports throat discomfort with NG in place  Objective: Vitals:   11/09/18 1100 11/09/18 1200 11/09/18 1300 11/09/18 1400  BP:      Pulse: 81 (!) 114 99 87  Resp: (!) 23 (!) 27 (!) 28 (!) 27  Temp:  99.7 F (37.6 C)    TempSrc:  Oral    SpO2: 97% 97% 95% 94%  Weight:      Height:        Intake/Output Summary (Last 24 hours) at 11/09/2018 1551 Last data filed at 11/09/2018 1400 Gross per 24 hour  Intake 1257.32 ml  Output 925 ml  Net  332.32 ml   Filed Weights   11/07/18 1323 11/08/18 0340 11/09/18 0500  Weight: 86.2 kg 89.8 kg 90.7 kg    Examination:  General exam: Appears calm and comfortable  Respiratory system: Clear to auscultation. Respiratory effort normal. Cardiovascular system: S1 & S2 heard, RRR Gastrointestinal system: Abdomen is nondistended, soft and nontender. No organomegaly or masses felt. Normal bowel sounds heard. Central nervous system: Alert and oriented. No focal neurological  deficits. Extremities: Symmetric 5 x 5 power. Skin: No rashes, lesions  Psychiatry: Judgement and insight appear normal. Mood & affect appropriate.   Data Reviewed: I have personally reviewed following labs and imaging studies  CBC: Recent Labs  Lab 11/06/18 2159 11/07/18 1449 11/07/18 1620 11/07/18 1659 11/08/18 0300 11/08/18 1614 11/09/18 0159  WBC 8.5 10.0  --   --  7.3  --   --   HGB 12.5* 9.9* 11.9* 11.2* 9.4* 10.4* 10.4*  HCT 38.9* 31.8* 35.0* 33.0* 29.8* 33.8* 33.3*  MCV 87.6 88.3  --   --  89.8  --   --   PLT 296 240  --   --  217  --   --    Basic Metabolic Panel: Recent Labs  Lab 11/06/18 2159 11/07/18 1620 11/07/18 1659 11/08/18 0300 11/09/18 0159  NA 140 139 139 138 139  K 4.3 3.8 3.9 3.3* 3.8  CL 105  --   --  110 110  CO2 24  --   --  20* 22  GLUCOSE 130*  --   --  121* 120*  BUN 20  --   --  17 14  CREATININE 0.82  --   --  0.78 0.75  CALCIUM 9.6  --   --  8.2* 8.0*  MG  --   --   --  1.5* 1.8  PHOS  --   --   --  <1.0* 2.0*   GFR: Estimated Creatinine Clearance: 84.8 mL/min (by C-G formula based on SCr of 0.75 mg/dL). Liver Function Tests: Recent Labs  Lab 11/06/18 2159 11/08/18 0300  AST 19 77*  ALT 14 89*  ALKPHOS 76 47  BILITOT 0.9 0.6  PROT 8.1 5.8*  ALBUMIN 4.7 3.2*   Recent Labs  Lab 11/06/18 2159  LIPASE 37   No results for input(s): AMMONIA in the last 168 hours. Coagulation Profile: Recent Labs  Lab 11/07/18 0517  INR 0.97   Cardiac Enzymes: No results for input(s): CKTOTAL, CKMB, CKMBINDEX, TROPONINI in the last 168 hours. BNP (last 3 results) No results for input(s): PROBNP in the last 8760 hours. HbA1C: No results for input(s): HGBA1C in the last 72 hours. CBG: No results for input(s): GLUCAP in the last 168 hours. Lipid Profile: Recent Labs    11/07/18 1908  TRIG 21   Thyroid Function Tests: No results for input(s): TSH, T4TOTAL, FREET4, T3FREE, THYROIDAB in the last 72 hours. Anemia Panel: No results  for input(s): VITAMINB12, FOLATE, FERRITIN, TIBC, IRON, RETICCTPCT in the last 72 hours. Sepsis Labs: Recent Labs  Lab 11/07/18 1132 11/07/18 1858 11/08/18 0300 11/09/18 0159  PROCALCITON  --  <0.10 <0.10 <0.10  LATICACIDVEN 1.2 1.9 1.4  --     No results found for this or any previous visit (from the past 240 hour(s)).   Radiology Studies: Dg Chest Port 1 View  Result Date: 11/08/2018 CLINICAL DATA:  Acute respiratory failure with hypoxia EXAM: PORTABLE CHEST 1 VIEW COMPARISON:  Yesterday FINDINGS: Endotracheal tube tip between the clavicular heads and carina. The  orogastric tube reaches the stomach. Drain overlapping the lower mediastinum. Low volume chest with streaky opacity best attributed atelectasis. No edema, effusion, or pneumothorax. Remote left rib fractures. IMPRESSION: Stable hardware positioning and low lung volumes with atelectasis. Electronically Signed   By: Monte Fantasia M.D.   On: 11/08/2018 06:46   Dg Chest Port 1 View  Result Date: 11/07/2018 CLINICAL DATA:  Status post intubation EXAM: PORTABLE CHEST 1 VIEW COMPARISON:  11/07/2018, 11/21/2013 FINDINGS: Postsurgical changes of the cervical spine. Endotracheal tube tip is about 14 mm superior to the carina. Esophageal tube tip appears positioned over the lower chest, likely within large hiatal hernia. Bibasilar atelectasis. Stable cardiomediastinal silhouette. No pneumothorax. IMPRESSION: 1. Endotracheal tube tip about 14 mm superior to carina 2. Esophageal tube tip projects over the lower chest likely within large hiatal hernia 3. Bibasilar atelectasis Electronically Signed   By: Donavan Foil M.D.   On: 11/07/2018 19:14    Scheduled Meds: . chlorhexidine gluconate (MEDLINE KIT)  15 mL Mouth Rinse BID  . lip balm  1 application Topical BID  . magic mouthwash  15 mL Oral TID  . mouth rinse  15 mL Mouth Rinse BID  . metoprolol tartrate  5 mg Intravenous Q6H  . [START ON 11/10/2018] pantoprazole  40 mg Intravenous Q12H    Continuous Infusions: . dextrose 5 % and 0.9% NaCl 30 mL/hr at 11/09/18 1400  . fentaNYL infusion INTRAVENOUS Stopped (11/08/18 0735)  . ondansetron (ZOFRAN) IV       LOS: 2 days   Marylu Lund, MD [facility] Pager On Amion  If 7PM-7AM, please contact night-coverage 11/09/2018, 3:51 PM

## 2018-11-09 NOTE — Progress Notes (Signed)
[address] Daniel Dalton [id number] 31-Oct-1940  CARE TEAM:  PCP: Prince Solian, MD  Outpatient Care Team: Patient Care Team: Prince Solian, MD as PCP - General (Internal Medicine) Burtis Junes, NP as PCP - Cardiology (Nurse Practitioner) Clarene Essex, MD as Consulting Physician (Gastroenterology) Michael Boston, MD as Consulting Physician (General Surgery)  Inpatient Treatment Team: Treatment Team: Attending Provider: Donne Hazel, MD; Consulting Physician: Nolon Nations, MD; Consulting Physician: Lbcardiology, Michae Kava, MD; Consulting Physician: Michael Boston, MD; Consulting Physician: Pccm, Md, MD; Consulting Physician: Collene Gobble, MD; Rounding Team: Garner Gavel, MD; Registered Nurse: Cyndie Chime, RN   Problem List:   Principal Problem:   Mesenteroaxial gastric volvulus with obstruction s/p robotic reduction/repair/partial fundoplication 6/81/1572 Active Problems:   Atrial fibrillation with rapid ventricular response ([place])   Incarcerated hiatal hernia   Acute respiratory failure with hypoxia (HCC)   OSA (obstructive sleep apnea)   HTN (hypertension)   Ulcerative colitis ([place])   Hyperlipidemia   Gastric outlet obstruction   Coffee ground emesis   S/P robotic partial posterior Toupet fundoplication 03/29/3558   2 Days Post-Op  11/07/2018  POST-OPERATIVE DIAGNOSIS:  Incarcerated hiatal hernia with mesenteroaxial gastric volvulus Gastric outlet obstruction Gastritis  PROCEDURE:   1. ROBOTIC reduction of paraesophageal hiatal hernia 2. Type II mediastinal dissection. 3. Primary repair of hiatal hernia over pledgets.  4. Anterior & posterior gastropexy. 5. Toupet (partial 240 degree posterior) fundoplication x 3.5 cm 6. EGD  SURGEON:  Adin Hector, MD    Assessment  Improving but guarded.  [facility] Stay = 2 days)  Plan:  Intermittent tachycardia.  Patient usually on home metoprolol.  Will place on low-dose IV.  Obviously medicine and  cardiology can adjust.  Hypokalemia.  Replacing  Hypo-magnesium replace more.  Hypophosphatemia.  Replacing.  Nasogastric tube clamping trial.  Let him try low-volume clears.  If bloating or high residuals, return to low intermittent wall suction.  If tolerate clears well and no evidence of any gastric ileus, may be able to remove tomorrow and transition to oral medications  Once his tachycardia issues have stabilized, perhaps can be transferred to floor tomorrow.  Defer to critical care/primary medicine team.  VTE prophylaxis- SCDs, etc  mobilize as tolerated to help recovery  20 minutes spent in review, evaluation, examination, counseling, and coordination of care.  More than 50% of that time was spent in counseling.  11/09/2018    Subjective: (Chief complaint)  Extubated.  Intermittently tachycardic.  Pain controlled.  Sleeping down in bed.  Wishes to sit back up.  Nursing in room helping him adjust up.  Objective:  Vital signs:  Vitals:   11/09/18 0300 11/09/18 0331 11/09/18 0400 11/09/18 0500  BP:      Pulse: (!) 108  (!) 101   Resp: (!) 28  (!) 31   Temp:  99.6 F (37.6 C)    TempSrc:  Oral    SpO2: 96%  96%   Weight:    90.7 kg  Height:           Intake/Output   Yesterday:  01/30 0701 - 01/31 0700 In: 1450 [I.V.:751.4; IV Piggyback:698.7] Out: 1000 [Urine:850; Drains:150] This shift:  No intake/output data recorded.  Bowel function:  Flatus: No  BM:  No  NGT: Nasogastric tube with scant thick and bilious output.  50 mL in the canister.  Flushes well. [place] surgical drain goes into mediastinum: Serosanguineous thin output   Physical Exam:  General: Pt awake/alert/oriented x4 in  acute distress.  Extubated talking.  Tired but not toxic.Marland Kitchen Eyes: PERRL, normal EOM.  Sclera clear.  No icterus Neuro: CN II-XII intact w/o focal sensory/motor deficits. Lymph: No head/neck/groin lymphadenopathy Psych:  No  delerium/psychosis/paranoia HENT: Normocephalic, Mucus membranes moist.  No thrush.  NG tube in place.  Mild sore throat. Neck: Supple, No tracheal deviation Chest: No chest wall pain w good excursion CV:  Pulses intact.  Regular rhythm MS: Normal AROM mjr joints.  No obvious deformity  Abdomen: Soft.  Obese.  Mildy distended.  Mildly tender at incisions only.  No evidence of peritonitis.  No incarcerated hernias.  Ext:  No deformity.  No mjr edema.  No cyanosis Skin: No petechiae / purpura  Results:   Labs: Results for orders placed or performed during the hospital encounter of 11/07/18 (from the past 48 hour(s))  Lactic acid, plasma     Status: None   Collection Time: 11/07/18 11:32 AM  Result Value Ref Range   Lactic Acid, Venous 1.2 0.5 - 1.9 mmol/L    Comment: Performed at [facility], [place] [address] [place], [place] [address]  CBC     Status: Abnormal   Collection Time: 11/07/18  2:49 PM  Result Value Ref Range   WBC 10.0 4.0 - 10.5 K/uL   RBC 3.60 (L) 4.22 - 5.81 MIL/uL   Hemoglobin 9.9 (L) 13.0 - 17.0 g/dL   HCT 31.8 (L) 39.0 - 52.0 %   MCV 88.3 80.0 - 100.0 fL   MCH 27.5 26.0 - 34.0 pg   MCHC 31.1 30.0 - 36.0 g/dL   RDW 14.2 11.5 - 15.5 %   Platelets 240 150 - 400 K/uL   nRBC 0.0 0.0 - 0.2 %    Comment: Performed at [facility], [place] [address], [place], [place] [address]  I-STAT 7, (LYTES, BLD GAS, ICA, H+H)     Status: Abnormal   Collection Time: 11/07/18  4:20 PM  Result Value Ref Range   pH, Arterial 7.167 (LL) 7.350 - 7.450   pCO2 arterial 64.6 (H) 32.0 - 48.0 mmHg   pO2, Arterial 96.0 83.0 - 108.0 mmHg   Bicarbonate 23.6 20.0 - 28.0 mmol/L   TCO2 26 22 - 32 mmol/L   O2 Saturation 95.0 %   Acid-base deficit 6.0 (H) 0.0 - 2.0 mmol/L   Sodium 139 135 - 145 mmol/L   Potassium 3.8 3.5 - 5.1 mmol/L   Calcium, Ion 1.25 1.15 - 1.40 mmol/L   HCT 35.0 (L) 39.0 - 52.0 %   Hemoglobin 11.9 (L) 13.0 - 17.0 g/dL   Patient  temperature 36.2 C    Sample type ARTERIAL   I-STAT 7, (LYTES, BLD GAS, ICA, H+H)     Status: Abnormal   Collection Time: 11/07/18  4:59 PM  Result Value Ref Range   pH, Arterial 7.223 (L) 7.350 - 7.450   pCO2 arterial 53.3 (H) 32.0 - 48.0 mmHg   pO2, Arterial 88.0 83.0 - 108.0 mmHg   Bicarbonate 22.3 20.0 - 28.0 mmol/L   TCO2 24 22 - 32 mmol/L   O2 Saturation 95.0 %   Acid-base deficit 6.0 (H) 0.0 - 2.0 mmol/L   Sodium 139 135 - 145 mmol/L   Potassium 3.9 3.5 - 5.1 mmol/L   Calcium, Ion 1.21 1.15 - 1.40 mmol/L   HCT 33.0 (L) 39.0 - 52.0 %   Hemoglobin 11.2 (L) 13.0 - 17.0 g/dL   Patient temperature 36.0 C    Sample type  ARTERIAL   Lactic acid, plasma     Status: None   Collection Time: 11/07/18  6:58 PM  Result Value Ref Range   Lactic Acid, Venous 1.9 0.5 - 1.9 mmol/L    Comment: Performed at [facility], [place] [address], [place],  [address]  Procalcitonin - Baseline     Status: None   Collection Time: 11/07/18  6:58 PM  Result Value Ref Range   Procalcitonin <0.10 ng/mL    Comment:        Interpretation: PCT (Procalcitonin) <= 0.5 ng/mL: Systemic infection (sepsis) is not likely. Local bacterial infection is possible. (NOTE)       Sepsis PCT Algorithm           Lower Respiratory Tract                                      Infection PCT Algorithm    ----------------------------     ----------------------------         PCT < 0.25 ng/mL                PCT < 0.10 ng/mL         Strongly encourage             Strongly discourage   discontinuation of antibiotics    initiation of antibiotics    ----------------------------     -----------------------------       PCT 0.25 - 0.50 ng/mL            PCT 0.10 - 0.25 ng/mL               OR       >80% decrease in PCT            Discourage initiation of                                            antibiotics      Encourage discontinuation           of antibiotics    ----------------------------      -----------------------------         PCT >= 0.50 ng/mL              PCT 0.26 - 0.50 ng/mL               AND        <80% decrease in PCT             Encourage initiation of                                             antibiotics       Encourage continuation           of antibiotics    ----------------------------     -----------------------------        PCT >= 0.50 ng/mL                  PCT > 0.50 ng/mL               AND         increase in PCT  Strongly encourage                                      initiation of antibiotics    Strongly encourage escalation           of antibiotics                                     -----------------------------                                           PCT <= 0.25 ng/mL                                                 OR                                        > 80% decrease in PCT                                     Discontinue / Do not initiate                                             antibiotics Performed at [place] [address], [place], [place] [address]   Triglycerides     Status: None   Collection Time: 11/07/18  7:08 PM  Result Value Ref Range   Triglycerides 21 <150 mg/dL    Comment: Performed at [facility], [place] [address], [place], [place] [address]  Blood gas, arterial     Status: Abnormal   Collection Time: 11/07/18  8:00 PM  Result Value Ref Range   FIO2 100.00    Delivery systems VENTILATOR    Mode PRESSURE REGULATED VOLUME CONTROL    VT 580 mL   LHR 24 resp/min   Peep/cpap 5.0 cm H20   pH, Arterial 7.465 (H) 7.350 - 7.450   pCO2 arterial 32.3 32.0 - 48.0 mmHg   pO2, Arterial 402 (H) 83.0 - 108.0 mmHg   Bicarbonate 22.9 20.0 - 28.0 mmol/L   Acid-Base Excess 0.1 0.0 - 2.0 mmol/L   O2 Saturation 98.7 %   Patient temperature 98.6    Collection site A-LINE    Drawn by [id number]    Sample type ARTERIAL DRAW     Comment: Performed at [place] [address] [place], [place] [address]  Comprehensive metabolic panel     Status: Abnormal   Collection Time: 11/08/18  3:00 AM  Result Value Ref Range   Sodium 138 135 - 145 mmol/L   Potassium 3.3 (L) 3.5 - 5.1 mmol/L   Chloride 110 98 - 111 mmol/L   CO2 20 (L) 22 - 32 mmol/L   Glucose, Bld 121 (H) 70 - 99 mg/dL  BUN 17 8 - 23 mg/dL   Creatinine, Ser 0.78 0.61 - 1.24 mg/dL   Calcium 8.2 (L) 8.9 - 10.3 mg/dL   Total Protein 5.8 (L) 6.5 - 8.1 g/dL   Albumin 3.2 (L) 3.5 - 5.0 g/dL   AST 77 (H) 15 - 41 U/L   ALT 89 (H) 0 - 44 U/L   Alkaline Phosphatase 47 38 - 126 U/L   Total Bilirubin 0.6 0.3 - 1.2 mg/dL   GFR calc non Af Amer >60 >60 mL/min   GFR calc Af Amer >60 >60 mL/min   Anion gap 8 5 - 15    Comment: Performed at [facility], [place] [address], [place], [place] [address]  CBC     Status: Abnormal   Collection Time: 11/08/18  3:00 AM  Result Value Ref Range   WBC 7.3 4.0 - 10.5 K/uL   RBC 3.32 (L) 4.22 - 5.81 MIL/uL   Hemoglobin 9.4 (L) 13.0 - 17.0 g/dL   HCT 29.8 (L) 39.0 - 52.0 %   MCV 89.8 80.0 - 100.0 fL   MCH 28.3 26.0 - 34.0 pg   MCHC 31.5 30.0 - 36.0 g/dL   RDW 14.5 11.5 - 15.5 %   Platelets 217 150 - 400 K/uL   nRBC 0.0 0.0 - 0.2 %    Comment: Performed at [facility], [place] [address], [place], [place] [address]  Magnesium     Status: Abnormal   Collection Time: 11/08/18  3:00 AM  Result Value Ref Range   Magnesium 1.5 (L) 1.7 - 2.4 mg/dL    Comment: Performed at [facility], [place] [address] [place], [place] [address]  Phosphorus     Status: Abnormal   Collection Time: 11/08/18  3:00 AM  Result Value Ref Range   Phosphorus <1.0 (LL) 2.5 - 4.6 mg/dL    Comment: CRITICAL RESULT CALLED TO, READ BACK BY AND VERIFIED WITH: RN ZOE AT 3220 11/08/18 CRUICKSHANK A Performed at [facility], [place] [address] [place], [place] [address]   Lactic acid, plasma     Status: None    Collection Time: 11/08/18  3:00 AM  Result Value Ref Range   Lactic Acid, Venous 1.4 0.5 - 1.9 mmol/L    Comment: Performed at [facility], [place] [address] [place], [place] [address]  Procalcitonin     Status: None   Collection Time: 11/08/18  3:00 AM  Result Value Ref Range   Procalcitonin <0.10 ng/mL    Comment:        Interpretation: PCT (Procalcitonin) <= 0.5 ng/mL: Systemic infection (sepsis) is not likely. Local bacterial infection is possible. (NOTE)       Sepsis PCT Algorithm           Lower Respiratory Tract                                      Infection PCT Algorithm    ----------------------------     ----------------------------         PCT < 0.25 ng/mL                PCT < 0.10 ng/mL         Strongly encourage             Strongly discourage   discontinuation of antibiotics    initiation of antibiotics    ----------------------------     -----------------------------  PCT 0.25 - 0.50 ng/mL            PCT 0.10 - 0.25 ng/mL               OR       >80% decrease in PCT            Discourage initiation of                                            antibiotics      Encourage discontinuation           of antibiotics    ----------------------------     -----------------------------         PCT >= 0.50 ng/mL              PCT 0.26 - 0.50 ng/mL               AND        <80% decrease in PCT             Encourage initiation of                                             antibiotics       Encourage continuation           of antibiotics    ----------------------------     -----------------------------        PCT >= 0.50 ng/mL                  PCT > 0.50 ng/mL               AND         increase in PCT                  Strongly encourage                                      initiation of antibiotics    Strongly encourage escalation           of antibiotics                                     -----------------------------                                            PCT <= 0.25 ng/mL                                                 OR                                        > 80% decrease in PCT  Discontinue / Do not initiate                                             antibiotics Performed at [place] [address], [place], [place] [address]   Hemoglobin and hematocrit, blood     Status: Abnormal   Collection Time: 11/08/18  4:14 PM  Result Value Ref Range   Hemoglobin 10.4 (L) 13.0 - 17.0 g/dL   HCT 33.8 (L) 39.0 - 52.0 %    Comment: Performed at [facility], [place] [address], [place], [place] [address]  Hemoglobin and hematocrit, blood     Status: Abnormal   Collection Time: 11/09/18  1:59 AM  Result Value Ref Range   Hemoglobin 10.4 (L) 13.0 - 17.0 g/dL   HCT 33.3 (L) 39.0 - 52.0 %    Comment: Performed at [facility] [place] [address] [place], [place] [address]  Basic metabolic panel     Status: Abnormal   Collection Time: 11/09/18  1:59 AM  Result Value Ref Range   Sodium 139 135 - 145 mmol/L   Potassium 3.8 3.5 - 5.1 mmol/L   Chloride 110 98 - 111 mmol/L   CO2 22 22 - 32 mmol/L   Glucose, Bld 120 (H) 70 - 99 mg/dL   BUN 14 8 - 23 mg/dL   Creatinine, Ser 0.75 0.61 - 1.24 mg/dL   Calcium 8.0 (L) 8.9 - 10.3 mg/dL   GFR calc non Af Amer >60 >60 mL/min   GFR calc Af Amer >60 >60 mL/min   Anion gap 7 5 - 15    Comment: Performed at [facility], [place] [address] [place], [place] [address]  Magnesium     Status: None   Collection Time: 11/09/18  1:59 AM  Result Value Ref Range   Magnesium 1.8 1.7 - 2.4 mg/dL    Comment: Performed at [facility], [place] [address], [place], [place] [address]  Phosphorus     Status: Abnormal   Collection Time: 11/09/18  1:59 AM  Result Value Ref Range   Phosphorus 2.0 (L) 2.5 - 4.6 mg/dL    Comment: Performed at [facility], [place] [address]  [place], [place] [address]  Procalcitonin     Status: None   Collection Time: 11/09/18  1:59 AM  Result Value Ref Range   Procalcitonin <0.10 ng/mL    Comment:        Interpretation: PCT (Procalcitonin) <= 0.5 ng/mL: Systemic infection (sepsis) is not likely. Local bacterial infection is possible. (NOTE)       Sepsis PCT Algorithm           Lower Respiratory Tract                                      Infection PCT Algorithm    ----------------------------     ----------------------------         PCT < 0.25 ng/mL                PCT < 0.10 ng/mL         Strongly encourage             Strongly discourage   discontinuation of antibiotics  initiation of antibiotics    ----------------------------     -----------------------------       PCT 0.25 - 0.50 ng/mL            PCT 0.10 - 0.25 ng/mL               OR       >80% decrease in PCT            Discourage initiation of                                            antibiotics      Encourage discontinuation           of antibiotics    ----------------------------     -----------------------------         PCT >= 0.50 ng/mL              PCT 0.26 - 0.50 ng/mL               AND        <80% decrease in PCT             Encourage initiation of                                             antibiotics       Encourage continuation           of antibiotics    ----------------------------     -----------------------------        PCT >= 0.50 ng/mL                  PCT > 0.50 ng/mL               AND         increase in PCT                  Strongly encourage                                      initiation of antibiotics    Strongly encourage escalation           of antibiotics                                     -----------------------------                                           PCT <= 0.25 ng/mL                                                 OR                                        >  80% decrease in PCT                                      Discontinue / Do not initiate                                             antibiotics Performed at [place] [address], [place], [place] [address]     Imaging / Studies: Dg Chest Port 1 View  Result Date: 11/08/2018 CLINICAL DATA:  Acute respiratory failure with hypoxia EXAM: PORTABLE CHEST 1 VIEW COMPARISON:  Yesterday FINDINGS: Endotracheal tube tip between the clavicular heads and carina. The orogastric tube reaches the stomach. Drain overlapping the lower mediastinum. Low volume chest with streaky opacity best attributed atelectasis. No edema, effusion, or pneumothorax. Remote left rib fractures. IMPRESSION: Stable hardware positioning and low lung volumes with atelectasis. Electronically Signed   By: Monte Fantasia M.D.   On: 11/08/2018 06:46   Dg Chest Port 1 View  Result Date: 11/07/2018 CLINICAL DATA:  Status post intubation EXAM: PORTABLE CHEST 1 VIEW COMPARISON:  11/07/2018, 11/21/2013 FINDINGS: Postsurgical changes of the cervical spine. Endotracheal tube tip is about 14 mm superior to the carina. Esophageal tube tip appears positioned over the lower chest, likely within large hiatal hernia. Bibasilar atelectasis. Stable cardiomediastinal silhouette. No pneumothorax. IMPRESSION: 1. Endotracheal tube tip about 14 mm superior to carina 2. Esophageal tube tip projects over the lower chest likely within large hiatal hernia 3. Bibasilar atelectasis Electronically Signed   By: Donavan Foil M.D.   On: 11/07/2018 19:14   Dg Chest Portable 1 View  Result Date: 11/07/2018 CLINICAL DATA:  Large hiatal hernia. History of atrial fibrillation. Abdominal pain. EXAM: PORTABLE CHEST 1 VIEW COMPARISON:  CT 11/07/2018. FINDINGS: Mediastinum and hilar structures normal. Large hiatal hernia. NG tube tip noted coiled in the hiatal hernia. Cardiomegaly with normal pulmonary vascularity. Mild basilar atelectasis. No pleural effusion or pneumothorax. No acute bony abnormality. Prior  cervical spine fusion. IMPRESSION: 1. Large hiatal hernia. NG tube noted with its tip coiled in the hiatal hernia. 2. Cardiomegaly. No pulmonary venous congestion. Mild bibasilar atelectasis. Electronically Signed   By: Marcello Moores  Register   On: 11/07/2018 09:57    Medications / Allergies: per chart  Antibiotics: Anti-infectives (From admission, onward)   Start     Dose/Rate Route Frequency Ordered Stop   11/07/18 2100  metroNIDAZOLE (FLAGYL) IVPB 500 mg     500 mg 100 mL/hr over 60 Minutes Intravenous Every 6 hours 11/07/18 1850 11/08/18 1017   11/07/18 1230  metroNIDAZOLE (FLAGYL) IVPB 500 mg     500 mg 100 mL/hr over 60 Minutes Intravenous On call to O.R. 11/07/18 1108 11/07/18 1458   11/07/18 1215  cefTRIAXone (ROCEPHIN) 2 g in sodium chloride 0.9 % 100 mL IVPB     2 g 200 mL/hr over 30 Minutes Intravenous On call to O.R. 11/07/18 1108 11/07/18 1513        Note: Portions of this report may have been transcribed using voice recognition software. Every effort was made to ensure accuracy; however, inadvertent computerized transcription errors may be present.   Any transcriptional errors that result from this process are unintentional.     Adin Hector, MD, FACS, MASCRS Gastrointestinal and Minimally Invasive Surgery  [address], [place], [place] [address] [phone] Main / Paging [phone] Fax

## 2018-11-09 NOTE — Progress Notes (Signed)
Progress Note  Patient Name: Daniel Dalton. Date of Encounter: 11/09/2018  Primary Cardiologist: Truitt Merle, NP   Subjective   Had an episode of nonsustained atrial tachycardia overnight.  Currently in NSR with PACs.  No SOB or Chest pain  Inpatient Medications    Scheduled Meds: . chlorhexidine gluconate (MEDLINE KIT)  15 mL Mouth Rinse BID  . lip balm  1 application Topical BID  . magic mouthwash  15 mL Oral TID  . mouth rinse  15 mL Mouth Rinse BID  . metoprolol tartrate  2.5 mg Intravenous Q6H  . [START ON 11/10/2018] pantoprazole  40 mg Intravenous Q12H   Continuous Infusions: . dextrose 5 % and 0.9% NaCl 30 mL/hr at 11/09/18 0551  . fentaNYL infusion INTRAVENOUS Stopped (11/08/18 0735)  . magnesium sulfate 1 - 4 g bolus IVPB    . ondansetron (ZOFRAN) IV    . potassium PHOSPHATE IVPB (in mmol) 20 mmol (11/09/18 0412)   PRN Meds: [DISCONTINUED] acetaminophen **OR** acetaminophen, bisacodyl, diphenhydrAMINE, guaiFENesin-dextromethorphan, hydrALAZINE, hydrocortisone, hydrocortisone cream, magic mouthwash, metoprolol tartrate, ondansetron (ZOFRAN) IV **OR** ondansetron (ZOFRAN) IV, ondansetron **OR** [DISCONTINUED] ondansetron (ZOFRAN) IV, phenol, prochlorperazine   Vital Signs    Vitals:   11/09/18 0300 11/09/18 0331 11/09/18 0400 11/09/18 0500  BP:      Pulse: (!) 108  (!) 101   Resp: (!) 28  (!) 31   Temp:  99.6 F (37.6 C)    TempSrc:  Oral    SpO2: 96%  96%   Weight:    90.7 kg  Height:        Intake/Output Summary (Last 24 hours) at 11/09/2018 0835 Last data filed at 11/09/2018 0412 Gross per 24 hour  Intake 1013.44 ml  Output 975 ml  Net 38.44 ml   Filed Weights   11/07/18 1323 11/08/18 0340 11/09/18 0500  Weight: 86.2 kg 89.8 kg 90.7 kg    Telemetry    NSR with PAC and nonsustained atrial tachycardia - Personally Reviewed  ECG    No new EKG to review - Personally Reviewed  Physical Exam   GEN: No acute distress.   Neck: No  JVD Cardiac: RRR, no murmurs, rubs, or gallops.  Respiratory: Clear to auscultation bilaterally. GI: Soft, nontender, non-distended  MS: No edema; No deformity. Neuro:  Nonfocal  Psych: Normal affect   Labs    Chemistry Recent Labs  Lab 11/06/18 2159  11/07/18 1659 11/08/18 0300 11/09/18 0159  NA 140   < > 139 138 139  K 4.3   < > 3.9 3.3* 3.8  CL 105  --   --  110 110  CO2 24  --   --  20* 22  GLUCOSE 130*  --   --  121* 120*  BUN 20  --   --  17 14  CREATININE 0.82  --   --  0.78 0.75  CALCIUM 9.6  --   --  8.2* 8.0*  PROT 8.1  --   --  5.8*  --   ALBUMIN 4.7  --   --  3.2*  --   AST 19  --   --  77*  --   ALT 14  --   --  89*  --   ALKPHOS 76  --   --  47  --   BILITOT 0.9  --   --  0.6  --   GFRNONAA >60  --   --  >60 >60  GFRAA >60  --   --  >  60 >60  ANIONGAP 11  --   --  8 7   < > = values in this interval not displayed.     Hematology Recent Labs  Lab 11/06/18 2159 11/07/18 1449  11/08/18 0300 11/08/18 1614 11/09/18 0159  WBC 8.5 10.0  --  7.3  --   --   RBC 4.44 3.60*  --  3.32*  --   --   HGB 12.5* 9.9*   < > 9.4* 10.4* 10.4*  HCT 38.9* 31.8*   < > 29.8* 33.8* 33.3*  MCV 87.6 88.3  --  89.8  --   --   MCH 28.2 27.5  --  28.3  --   --   MCHC 32.1 31.1  --  31.5  --   --   RDW 14.1 14.2  --  14.5  --   --   PLT 296 240  --  217  --   --    < > = values in this interval not displayed.    Cardiac EnzymesNo results for input(s): TROPONINI in the last 168 hours. No results for input(s): TROPIPOC in the last 168 hours.   BNPNo results for input(s): BNP, PROBNP in the last 168 hours.   DDimer No results for input(s): DDIMER in the last 168 hours.   Radiology    Dg Chest Port 1 View  Result Date: 11/08/2018 CLINICAL DATA:  Acute respiratory failure with hypoxia EXAM: PORTABLE CHEST 1 VIEW COMPARISON:  Yesterday FINDINGS: Endotracheal tube tip between the clavicular heads and carina. The orogastric tube reaches the stomach. Drain overlapping the lower  mediastinum. Low volume chest with streaky opacity best attributed atelectasis. No edema, effusion, or pneumothorax. Remote left rib fractures. IMPRESSION: Stable hardware positioning and low lung volumes with atelectasis. Electronically Signed   By: Monte Fantasia M.D.   On: 11/08/2018 06:46   Dg Chest Port 1 View  Result Date: 11/07/2018 CLINICAL DATA:  Status post intubation EXAM: PORTABLE CHEST 1 VIEW COMPARISON:  11/07/2018, 11/21/2013 FINDINGS: Postsurgical changes of the cervical spine. Endotracheal tube tip is about 14 mm superior to the carina. Esophageal tube tip appears positioned over the lower chest, likely within large hiatal hernia. Bibasilar atelectasis. Stable cardiomediastinal silhouette. No pneumothorax. IMPRESSION: 1. Endotracheal tube tip about 14 mm superior to carina 2. Esophageal tube tip projects over the lower chest likely within large hiatal hernia 3. Bibasilar atelectasis Electronically Signed   By: Donavan Foil M.D.   On: 11/07/2018 19:14   Dg Chest Portable 1 View  Result Date: 11/07/2018 CLINICAL DATA:  Large hiatal hernia. History of atrial fibrillation. Abdominal pain. EXAM: PORTABLE CHEST 1 VIEW COMPARISON:  CT 11/07/2018. FINDINGS: Mediastinum and hilar structures normal. Large hiatal hernia. NG tube tip noted coiled in the hiatal hernia. Cardiomegaly with normal pulmonary vascularity. Mild basilar atelectasis. No pleural effusion or pneumothorax. No acute bony abnormality. Prior cervical spine fusion. IMPRESSION: 1. Large hiatal hernia. NG tube noted with its tip coiled in the hiatal hernia. 2. Cardiomegaly. No pulmonary venous congestion. Mild bibasilar atelectasis. Electronically Signed   By: Marcello Moores  Register   On: 11/07/2018 09:57    Cardiac Studies   Echo 11/07/18  IMPRESSIONS   1. The left ventricle appears to be normal in size, has moderate wall thickness 60-65% ejection fraction Spectral Doppler shows indeterminate pattern of diastolic filling. 2. The  right ventricle has mildly enlarged size and normal systolic function. 3. Right ventricular systolic pressure is could not be assessed due to inability  to visualize IVC. 4. Mildly dilated right atrial size. 5. Mitral valve regurgitation is trivial by color flow Doppler. 6. Tricuspid regurgitation is mild. 7. There is mild sclerosis of the aortic valve without stenosis.  FINDINGS Left Ventricle: The left ventricle appears to be normal in size, has moderate wall thickness normal systolic function with a 60-63% ejection fraction Spectral Doppler shows indeterminate pattern of diastolic filling. The left ventricular diastology  could not be evaluateddue to indeterminent diastolic function. Right Ventricle: The right ventricle is mildly enlarged in size normal wall thickness has normal systolic function. Left Atrium: The left atrium was not well visualized. The left atrium is normal in size. Right Atrium: The right atrium was not well visualized. The right atrial size is mildly dilated. Interatrial Septum: The interatrial septum was not assessed.  Pericardium: There is no evidence of pericardial effusion. Mitral Valve: The mitral valve normal in structure and function. Mitral valve regurgitation is trivial by color flow Doppler. Tricuspid Valve: The tricuspid valve is normal in structure. Tricuspid regurgitation is mild by color flow Doppler. Aortic Valve: The aortic valve tricuspid. There is mild sclerosis of the aortic valve. Pulmonic Valve: The pulmonic valve was not well visualized. The pulmonic valve is grossly normal. Pulmonic valve regurgitation is trivial by color flow Doppler. Venous: The inferior vena cava was not well visualized.   Patient Profile     78 y.o. 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  Now Post op.    Assessment & Plan    1.   Incarcerated hernia with mesenteroaxial gastric volvulus, gastric outlet obstruction and gastritis -s/p repair POD #2 -still has NGT in place -per surgery  2.  HTN -BP has been borderline high -PO meds on hold -continue to treat PRN IV Hydralazine -Increase Lopressor IV to 87m q6 hours for better BP control  3.  Paroxysmal atrial tachycardia -no afib noted on tele -increase lopressor IV to 56mq6 hours  4.  Paroxysmal atrial fibrillation -he has a history of PAF and has refused anticoagulation in the past -no PAF on tele -increasing IV Lopressor to suppress PAT -keep K+>4 -2E echo with normal LVF -restart home meds when taking PO     For questions or updates, please contact [facility] Please consult [email or web address] for contact info under Cardiology/STEMI.      Signed, TrFransico HimMD  11/09/2018, 8:35 AM

## 2018-11-10 ENCOUNTER — Inpatient Hospital Stay (HOSPITAL_COMMUNITY): Payer: Medicare Other

## 2018-11-10 DIAGNOSIS — K920 Hematemesis: Secondary | ICD-10-CM

## 2018-11-10 DIAGNOSIS — I471 Supraventricular tachycardia: Secondary | ICD-10-CM

## 2018-11-10 DIAGNOSIS — I4719 Other supraventricular tachycardia: Secondary | ICD-10-CM

## 2018-11-10 DIAGNOSIS — I2781 Cor pulmonale (chronic): Secondary | ICD-10-CM

## 2018-11-10 HISTORY — DX: Other supraventricular tachycardia: I47.19

## 2018-11-10 HISTORY — DX: Supraventricular tachycardia: I47.1

## 2018-11-10 LAB — COMPREHENSIVE METABOLIC PANEL
ALT: 130 U/L — ABNORMAL HIGH (ref 0–44)
AST: 63 U/L — ABNORMAL HIGH (ref 15–41)
Albumin: 3.5 g/dL (ref 3.5–5.0)
Alkaline Phosphatase: 52 U/L (ref 38–126)
Anion gap: 7 (ref 5–15)
BUN: 13 mg/dL (ref 8–23)
CALCIUM: 8.3 mg/dL — AB (ref 8.9–10.3)
CO2: 22 mmol/L (ref 22–32)
CREATININE: 0.8 mg/dL (ref 0.61–1.24)
Chloride: 109 mmol/L (ref 98–111)
GFR calc Af Amer: >60 mL/min (ref >60)
GFR calc non Af Amer: >60 mL/min (ref >60)
Glucose, Bld: 128 mg/dL — ABNORMAL HIGH (ref 70–99)
Potassium: 3.8 mmol/L (ref 3.5–5.1)
Sodium: 138 mmol/L (ref 135–145)
Total Bilirubin: 1.1 mg/dL (ref 0.3–1.2)
Total Protein: 6.6 g/dL (ref 6.5–8.1)

## 2018-11-10 LAB — HEMOGLOBIN AND HEMATOCRIT, BLOOD
HCT: 36.1 % — ABNORMAL LOW (ref 39.0–52.0)
Hemoglobin: 11 g/dL — ABNORMAL LOW (ref 13.0–17.0)

## 2018-11-10 LAB — CBC
HCT: 35.6 % — ABNORMAL LOW (ref 39.0–52.0)
Hemoglobin: 11.1 g/dL — ABNORMAL LOW (ref 13.0–17.0)
MCH: 28.5 pg (ref 26.0–34.0)
MCHC: 31.2 g/dL (ref 30.0–36.0)
MCV: 91.5 fL (ref 80.0–100.0)
NRBC: 0 % (ref 0.0–0.2)
PLATELETS: 225 10*3/uL (ref 150–400)
RBC: 3.89 MIL/uL — AB (ref 4.22–5.81)
RDW: 14.6 % (ref 11.5–15.5)
WBC: 6.9 10*3/uL (ref 4.0–10.5)

## 2018-11-10 LAB — PHOSPHORUS: Phosphorus: 2.2 mg/dL — ABNORMAL LOW (ref 2.5–4.6)

## 2018-11-10 LAB — MAGNESIUM: Magnesium: 2 mg/dL (ref 1.7–2.4)

## 2018-11-10 MED ORDER — METHOCARBAMOL 500 MG PO TABS: 1000.0000 mg | ORAL_TABLET | Freq: Four times a day (QID) | ORAL | Status: DC | PRN

## 2018-11-10 MED ORDER — SULFASALAZINE 500 MG PO TABS
2000.0000 mg | ORAL_TABLET | Freq: Two times a day (BID) | ORAL | Status: DC
  Administered 2018-11-10 – 2018-11-12 (×5): 2000 mg via ORAL
  Filled 2018-11-10 (×5): qty 4

## 2018-11-10 MED ORDER — DILTIAZEM HCL-DEXTROSE 100-5 MG/100ML-% IV SOLN (PREMIX)
5.0000 mg/h | INTRAVENOUS | Status: DC
  Administered 2018-11-10: 5 mg/h via INTRAVENOUS
  Filled 2018-11-10 (×2): qty 100

## 2018-11-10 MED ORDER — POTASSIUM PHOSPHATES 15 MMOLE/5ML IV SOLN
30.0000 mmol | Freq: Once | INTRAVENOUS | Status: AC
  Administered 2018-11-10: 30 mmol via INTRAVENOUS
  Filled 2018-11-10: qty 10

## 2018-11-10 MED ORDER — ASPIRIN EC 81 MG PO TBEC: 81.0000 mg | DELAYED_RELEASE_TABLET | Freq: Every day | ORAL | Status: DC

## 2018-11-10 MED ORDER — DILTIAZEM HCL 25 MG/5ML IV SOLN
10.0000 mg | Freq: Once | INTRAVENOUS | Status: AC
  Administered 2018-11-10: 10 mg via INTRAVENOUS
  Filled 2018-11-10: qty 5

## 2018-11-10 MED ORDER — DILTIAZEM HCL 100 MG IV SOLR
5.0000 mg/h | INTRAVENOUS | Status: DC
  Administered 2018-11-10: 5 mg/h via INTRAVENOUS
  Administered 2018-11-11: 10 mg/h via INTRAVENOUS
  Filled 2018-11-10 (×2): qty 100

## 2018-11-10 MED ORDER — SODIUM CHLORIDE 0.9% FLUSH
3.0000 mL | Freq: Two times a day (BID) | INTRAVENOUS | Status: DC
  Administered 2018-11-11 – 2018-11-12 (×2): 3 mL via INTRAVENOUS

## 2018-11-10 MED ORDER — ASPIRIN 81 MG PO CHEW
81.0000 mg | CHEWABLE_TABLET | Freq: Every day | ORAL | Status: DC
  Administered 2018-11-10 – 2018-11-12 (×3): 81 mg via ORAL
  Filled 2018-11-10 (×3): qty 1

## 2018-11-10 MED ORDER — TRAMADOL HCL 50 MG PO TABS: 50.0000 mg | ORAL_TABLET | Freq: Four times a day (QID) | ORAL | Status: DC | PRN

## 2018-11-10 MED ORDER — GABAPENTIN 300 MG PO CAPS: 300.0000 mg | ORAL_CAPSULE | Freq: Every day | ORAL | Status: DC

## 2018-11-10 MED ORDER — SODIUM CHLORIDE 0.9 % IV SOLN: 250.0000 mL | INTRAVENOUS | Status: DC | PRN

## 2018-11-10 MED ORDER — IOPAMIDOL (ISOVUE-300) INJECTION 61%
INTRAVENOUS | Status: AC
  Administered 2018-11-10: 13:00:00
  Filled 2018-11-10: qty 50

## 2018-11-10 MED ORDER — ACETAMINOPHEN 500 MG PO TABS
1000.0000 mg | ORAL_TABLET | Freq: Three times a day (TID) | ORAL | Status: DC
  Administered 2018-11-10 – 2018-11-12 (×5): 1000 mg via ORAL
  Filled 2018-11-10 (×6): qty 2

## 2018-11-10 MED ORDER — SODIUM CHLORIDE 0.9% FLUSH: 3.0000 mL | INTRAVENOUS | Status: DC | PRN

## 2018-11-10 MED ORDER — METOPROLOL TARTRATE 25 MG/10 ML ORAL SUSPENSION
25.0000 mg | Freq: Two times a day (BID) | ORAL | Status: DC
  Administered 2018-11-10: 25 mg via ORAL
  Filled 2018-11-10 (×3): qty 10

## 2018-11-10 MED ORDER — METHOCARBAMOL 1000 MG/10ML IJ SOLN
1000.0000 mg | Freq: Four times a day (QID) | INTRAVENOUS | Status: DC | PRN
  Filled 2018-11-10: qty 10

## 2018-11-10 MED ORDER — GABAPENTIN 250 MG/5ML PO SOLN
300.0000 mg | Freq: Every day | ORAL | Status: DC
  Administered 2018-11-10 – 2018-11-11 (×2): 300 mg via ORAL
  Filled 2018-11-10 (×2): qty 6

## 2018-11-10 MED ORDER — LACTATED RINGERS IV BOLUS: 1000.0000 mL | Freq: Three times a day (TID) | INTRAVENOUS | Status: DC | PRN

## 2018-11-10 MED ORDER — METOPROLOL TARTRATE 25 MG PO TABS: 25.0000 mg | ORAL_TABLET | Freq: Two times a day (BID) | ORAL | Status: DC

## 2018-11-10 MED ORDER — IRBESARTAN 300 MG PO TABS
300.0000 mg | ORAL_TABLET | Freq: Every day | ORAL | Status: DC
  Administered 2018-11-10 – 2018-11-12 (×3): 300 mg via ORAL
  Filled 2018-11-10 (×3): qty 1

## 2018-11-10 NOTE — Progress Notes (Addendum)
Pt tolerating clamped NG tube fairly well. No complaints of pain, or N/V. Sips of clear liquids offered to pt, but pt only prefers ice chips while NG tube is in place. Pt with two small, loose bowel movements during shift. Will continue to monitor closely.

## 2018-11-10 NOTE — Progress Notes (Signed)
Pt has been going in and out of a-fib with RVR throughout the shift with HR reaching as high as 160s nonsustained. Pt asymptomatic, no complaints of chest pain. VSS. EKG obtained per protocol. On call provider made aware of situation. Throughout the night scheduled metoprolol, PRN metoprolol, and one time dose of cardezim was given to pt with no relief. Bodenheimer, NP placed order to initiate Cardizem drip. This RN will carry out new orders. Orders verified with Ardeen Garland, RN. CMT made aware of changes. Will continue to monitor closely.

## 2018-11-10 NOTE — Progress Notes (Addendum)
[address] [name] [id number] 07-16-1941  CARE TEAM:  PCP: Prince Solian, MD  Outpatient Care Team: Patient Care Team: Prince Solian, MD as PCP - General (Internal Medicine) Burtis Junes, NP as PCP - Cardiology (Nurse Practitioner) Clarene Essex, MD as Consulting Physician (Gastroenterology) Michael Boston, MD as Consulting Physician (General Surgery)  Inpatient Treatment Team: Treatment Team: Attending Provider: Donne Hazel, MD; Consulting Physician: Nolon Nations, MD; Consulting Physician: Lb[facility]iology, Michae Kava, MD; Consulting Physician: Michael Boston, MD; Rounding Team: Garner Gavel, MD; Student Nurse: Camie Patience, Student-RN; Registered Nurse: Gentry Roch, RN; Registered Nurse: Leona Carry, RN; Technician: Layla Maw, NT; Registered Nurse: Derenda Fennel, RN   Problem List:   Principal Problem:   Mesenteroaxial gastric volvulus with obstruction s/p robotic reduction/repair/partial fundoplication 1/91/4782 Active Problems:   Atrial fibrillation with rapid ventricular response ([place])   Incarcerated hiatal hernia   Acute respiratory failure with hypoxia ([facility])   OSA (obstructive sleep apnea)   HTN (hypertension)   Ulcerative colitis ([place])   Hyperlipidemia   Gastric outlet obstruction   Coffee ground emesis   S/P robotic partial posterior Toupet fundoplication 9/56/2130   Hypophosphatemia   3 Days Post-Op  11/07/2018  POST-OPERATIVE DIAGNOSIS:  Incarcerated hiatal hernia with mesenteroaxial gastric volvulus Gastric outlet obstruction Gastritis  PROCEDURE:   1. ROBOTIC reduction of paraesophageal hiatal hernia 2. Type II mediastinal dissection. 3. Primary repair of hiatal hernia over pledgets.  4. Anterior & posterior gastropexy. 5. Toupet (partial 240 degree posterior) fundoplication x 3.5 cm 6. EGD  SURGEON:  Adin Hector, MD    Assessment  Improving but guarded.  [facility] Stay = 3 days)  Plan:  Tolerated nasogastric  tubing clamping trial with low residuals and flatus/bowel movements.  I remove the nasogastric tube.  Keep on clears.  Gastrografin swallow study this morning.  If no leak or obstruction, advance to a dysphagia 1 pured diet.  Most likely he will need to stay on that for the next few weeks until wrap edema resolves and then he can gradually advance to a usual solid diet over the next couple of months.    Intermittent tachycardia with some documentation of atrial fibrillation.  Rate controlled on diltiazem.  Defer to medicine/cardiology.  Okay to switch to oral medications if esophagram shows no wrap edema/obstruction.  Most likely will need medicines crushed or in elixir form the first week or so until wrap edema goes down   Hypokalemia.  Replacing  Hypomagnesemia - replaced.  Hypophosphatemia.  Replacing.  Most likely can switch to oral medications as esophagram shows no obstruction or other issue.  Consider decreasing proton pump inhibitor to just daily enterally  VTE prophylaxis- SCDs, etc  mobilize as tolerated to help recovery  20 minutes spent in review, evaluation, examination, counseling, and coordination of care.  More than 50% of that time was spent in counseling.  11/10/2018    Subjective: (Chief complaint)  Transferred to telemetry floor.  Instances of rapid heart rate.  Sometimes sinus.  Sometimes atrial fibrillation.  Placed on diltiazem.  Rate better controlled now.  Sitting up in chair in room.  Nursing help to bathe.  Had flatus and bowel movements.  No nausea with NG tube clamping.  Rather sore to swallow but been tolerating small volumes of ice chips and sips of liquids.  Wants NG tube out.  Objective:  Vital signs:  Vitals:   11/10/18 0515 11/10/18 0530 11/10/18 0545 11/10/18 0600  BP: (!) 140/100 138/90 137/90 (!) 141/69  Pulse: (!) 140 (!) 112 (!) 120 (!) 111  Resp:      Temp:      TempSrc:      SpO2:      Weight:      Height:        Last  BM Date: 11/09/18  Intake/Output   Yesterday:  01/31 0701 - 02/01 0700 In: 1603.7 [I.V.:1064.5; IV Piggyback:539.1] Out: 730 [Urine:700; Drains:30] This shift:  No intake/output data recorded.  Bowel function:  Flatus: YES  BM:  YES  NGT: Nasogastric tube with scant thick and bilious output.  50 mL in the canister.  Flushes well. [place] surgical drain goes into mediastinum: Serosanguineous thin output   Physical Exam:  General: Pt awake/alert/oriented x4 in NO acute distress.  Alert and chatty. Eyes: PERRL, normal EOM.  Sclera clear.  No icterus Neuro: CN II-XII intact w/o focal sensory/motor deficits. Lymph: No head/neck/groin lymphadenopathy Psych:  No delerium/psychosis/paranoia HENT: Normocephalic, Mucus membranes moist.  No thrush.  NG tube in place.  Mild sore throat. Neck: Supple, No tracheal deviation Chest: No chest wall pain w good excursion CV:  Pulses intact.  Regular rhythm MS: Normal AROM mjr joints.  No obvious deformity  Abdomen: Soft.  Obese.  Nondistended.  Nontender.  No evidence of peritonitis.  No incarcerated hernias.  Ext:  No deformity.  No mjr edema.  No cyanosis Skin: No petechiae / purpura  Results:   Labs: Results for orders placed or performed during the hospital encounter of 11/07/18 (from the past 48 hour(s))  Hemoglobin and hematocrit, blood     Status: Abnormal   Collection Time: 11/08/18  4:14 PM  Result Value Ref Range   Hemoglobin 10.4 (L) 13.0 - 17.0 g/dL   HCT 33.8 (L) 39.0 - 52.0 %    Comment: Performed at [facility], [place] [address] [place], [place] [address]  Hemoglobin and hematocrit, blood     Status: Abnormal   Collection Time: 11/09/18  1:59 AM  Result Value Ref Range   Hemoglobin 10.4 (L) 13.0 - 17.0 g/dL   HCT 33.3 (L) 39.0 - 52.0 %    Comment: Performed at [facility], [place] [address] [place], [place] [address]  Basic metabolic panel     Status: Abnormal   Collection  Time: 11/09/18  1:59 AM  Result Value Ref Range   Sodium 139 135 - 145 mmol/L   Potassium 3.8 3.5 - 5.1 mmol/L   Chloride 110 98 - 111 mmol/L   CO2 22 22 - 32 mmol/L   Glucose, Bld 120 (H) 70 - 99 mg/dL   BUN 14 8 - 23 mg/dL   Creatinine, Ser 0.75 0.61 - 1.24 mg/dL   Calcium 8.0 (L) 8.9 - 10.3 mg/dL   GFR calc non Af Amer >60 >60 mL/min   GFR calc Af Amer >60 >60 mL/min   Anion gap 7 5 - 15    Comment: Performed at [facility], [place] [address] [place], [place] [address]  Magnesium     Status: None   Collection Time: 11/09/18  1:59 AM  Result Value Ref Range   Magnesium 1.8 1.7 - 2.4 mg/dL    Comment: Performed at [facility], [place] [address] [place], [place] [address]  Phosphorus     Status: Abnormal   Collection Time: 11/09/18  1:59 AM  Result Value Ref Range   Phosphorus 2.0 (L) 2.5 - 4.6 mg/dL    Comment:  Performed at [facility], [place] [address] [place], [place] [address]  Procalcitonin     Status: None   Collection Time: 11/09/18  1:59 AM  Result Value Ref Range   Procalcitonin <0.10 ng/mL    Comment:        Interpretation: PCT (Procalcitonin) <= 0.5 ng/mL: Systemic infection (sepsis) is not likely. Local bacterial infection is possible. (NOTE)       Sepsis PCT Algorithm           Lower Respiratory Tract                                      Infection PCT Algorithm    ----------------------------     ----------------------------         PCT < 0.25 ng/mL                PCT < 0.10 ng/mL         Strongly encourage             Strongly discourage   discontinuation of antibiotics    initiation of antibiotics    ----------------------------     -----------------------------       PCT 0.25 - 0.50 ng/mL            PCT 0.10 - 0.25 ng/mL               OR       >80% decrease in PCT            Discourage initiation of                                            antibiotics      Encourage discontinuation           of  antibiotics    ----------------------------     -----------------------------         PCT >= 0.50 ng/mL              PCT 0.26 - 0.50 ng/mL               AND        <80% decrease in PCT             Encourage initiation of                                             antibiotics       Encourage continuation           of antibiotics    ----------------------------     -----------------------------        PCT >= 0.50 ng/mL                  PCT > 0.50 ng/mL               AND         increase in PCT                  Strongly encourage  initiation of antibiotics    Strongly encourage escalation           of antibiotics                                     -----------------------------                                           PCT <= 0.25 ng/mL                                                 OR                                        > 80% decrease in PCT                                     Discontinue / Do not initiate                                             antibiotics Performed at [place] [address], [place], [place] [address]   Hemoglobin and hematocrit, blood     Status: Abnormal   Collection Time: 11/09/18  3:28 PM  Result Value Ref Range   Hemoglobin 10.5 (L) 13.0 - 17.0 g/dL   HCT 33.5 (L) 39.0 - 52.0 %    Comment: Performed at [place], [place] [address] [place], [place] [address]  Magnesium     Status: None   Collection Time: 11/10/18  2:55 AM  Result Value Ref Range   Magnesium 2.0 1.7 - 2.4 mg/dL    Comment: Performed at [facility], [place] [address], [place], [place] [address]  Phosphorus     Status: Abnormal   Collection Time: 11/10/18  2:55 AM  Result Value Ref Range   Phosphorus 2.2 (L) 2.5 - 4.6 mg/dL    Comment: Performed at [facility], [place] [address] [place], [place] [address]  Comprehensive metabolic panel     Status: Abnormal    Collection Time: 11/10/18  2:55 AM  Result Value Ref Range   Sodium 138 135 - 145 mmol/L   Potassium 3.8 3.5 - 5.1 mmol/L   Chloride 109 98 - 111 mmol/L   CO2 22 22 - 32 mmol/L   Glucose, Bld 128 (H) 70 - 99 mg/dL   BUN 13 8 - 23 mg/dL   Creatinine, Ser 0.80 0.61 - 1.24 mg/dL   Calcium 8.3 (L) 8.9 - 10.3 mg/dL   Total Protein 6.6 6.5 - 8.1 g/dL   Albumin 3.5 3.5 - 5.0 g/dL   AST 63 (H) 15 - 41 U/L   ALT 130 (H) 0 - 44 U/L   Alkaline Phosphatase 52 38 - 126 U/L   Total Bilirubin 1.1 0.3 - 1.2 mg/dL   GFR calc non Af  Amer >60 >60 mL/min   GFR calc Af Amer >60 >60 mL/min   Anion gap 7 5 - 15    Comment: Performed at [facility], [place] [address], [place], [place] [address]  CBC     Status: Abnormal   Collection Time: 11/10/18  2:55 AM  Result Value Ref Range   WBC 6.9 4.0 - 10.5 K/uL   RBC 3.89 (L) 4.22 - 5.81 MIL/uL   Hemoglobin 11.1 (L) 13.0 - 17.0 g/dL   HCT 35.6 (L) 39.0 - 52.0 %   MCV 91.5 80.0 - 100.0 fL   MCH 28.5 26.0 - 34.0 pg   MCHC 31.2 30.0 - 36.0 g/dL   RDW 14.6 11.5 - 15.5 %   Platelets 225 150 - 400 K/uL   nRBC 0.0 0.0 - 0.2 %    Comment: Performed at [facility], [place] [address], [place], [place] [address]    Imaging / Studies: No results found.  Medications / Allergies: per chart  Antibiotics: Anti-infectives (From admission, onward)   Start     Dose/Rate Route Frequency Ordered Stop   11/07/18 2100  metroNIDAZOLE (FLAGYL) IVPB 500 mg     500 mg 100 mL/hr over 60 Minutes Intravenous Every 6 hours 11/07/18 1850 11/08/18 1017   11/07/18 1230  metroNIDAZOLE (FLAGYL) IVPB 500 mg     500 mg 100 mL/hr over 60 Minutes Intravenous On call to O.R. 11/07/18 1108 11/07/18 1458   11/07/18 1215  cefTRIAXone (ROCEPHIN) 2 g in sodium chloride 0.9 % 100 mL IVPB     2 g 200 mL/hr over 30 Minutes Intravenous On call to O.R. 11/07/18 1108 11/07/18 1513        Note: Portions of this report may have been transcribed using voice  recognition software. Every effort was made to ensure accuracy; however, inadvertent computerized transcription errors may be present.   Any transcriptional errors that result from this process are unintentional.     Adin Hector, MD, FACS, MASCRS Gastrointestinal and Minimally Invasive Surgery    [address] [place],  [address] [phone] Main / Paging [phone] Fax

## 2018-11-10 NOTE — Progress Notes (Signed)
Progress Note  Patient Name: Daniel Dalton. Date of Encounter: 11/10/2018  Primary Cardiologist: Truitt Merle, NP   Subjective   Denies any cardiovascular complaints. Did have some palpitations when he was having his "spells" yesterday evening. Started back on intravenous diltiazem for tachycardia interpreted as "atrial fibrillation" last night. Review of telemetry shows very frequent runs of paroxysmal atrial tachycardia, frequent PACs, possibly MAT.  No evidence of atrial fibrillation in my opinion.  Inpatient Medications    Scheduled Meds: . chlorhexidine gluconate (MEDLINE KIT)  15 mL Mouth Rinse BID  . lip balm  1 application Topical BID  . mouth rinse  15 mL Mouth Rinse BID  . metoprolol tartrate  5 mg Intravenous Q6H  . pantoprazole  40 mg Intravenous Q12H   Continuous Infusions: . dextrose 5 % and 0.9% NaCl 30 mL/hr at 11/09/18 1700  . diltiazem (CARDIZEM) infusion 10 mg/hr (11/10/18 0500)  . fentaNYL infusion INTRAVENOUS Stopped (11/08/18 0735)  . ondansetron (ZOFRAN) IV    . potassium PHOSPHATE IVPB (in mmol)     PRN Meds: [DISCONTINUED] acetaminophen **OR** acetaminophen, bisacodyl, diphenhydrAMINE, guaiFENesin-dextromethorphan, hydrALAZINE, hydrocortisone, hydrocortisone cream, magic mouthwash, metoprolol tartrate, ondansetron (ZOFRAN) IV **OR** ondansetron (ZOFRAN) IV, ondansetron **OR** [DISCONTINUED] ondansetron (ZOFRAN) IV, phenol, prochlorperazine   Vital Signs    Vitals:   11/10/18 0530 11/10/18 0545 11/10/18 0600 11/10/18 0815  BP: 138/90 137/90 (!) 141/69 (!) 154/68  Pulse: (!) 112 (!) 120 (!) 111 94  Resp:    20  Temp:    99.8 F (37.7 C)  TempSrc:    Oral  SpO2:    95%  Weight:      Height:        Intake/Output Summary (Last 24 hours) at 11/10/2018 0921 Last data filed at 11/10/2018 0600 Gross per 24 hour  Intake 1193.59 ml  Output 730 ml  Net 463.59 ml   Last 3 Weights 11/10/2018 11/09/2018 11/08/2018  Weight (lbs) 191 lb 11.2 oz 199 lb  15.3 oz 197 lb 15.6 oz  Weight (kg) 86.955 kg 90.7 kg 89.8 kg      Telemetry    Sinus rhythm with extremely frequent PACs; last night had extremely frequent bursts of paroxysmal atrial tachycardia, possibly multifocal atrial tachycardia- Personally Reviewed  ECG    No new tracing.- Personally Reviewed  Physical Exam  Lean, appears comfortable GEN: No acute distress.   Neck: No JVD Cardiac: RRR frequent ectopy, no murmurs, rubs, or gallops.  Respiratory:  Managed throughout, but otherwise clear to auscultation bilaterally. GI: Soft, nontender, non-distended  MS: No edema; No deformity. Neuro:  Nonfocal  Psych: Normal affect   Labs    Chemistry Recent Labs  Lab 11/06/18 2159  11/08/18 0300 11/09/18 0159 11/10/18 0255  NA 140   < > 138 139 138  K 4.3   < > 3.3* 3.8 3.8  CL 105  --  110 110 109  CO2 24  --  20* 22 22  GLUCOSE 130*  --  121* 120* 128*  BUN 20  --  17 14 13   CREATININE 0.82  --  0.78 0.75 0.80  CALCIUM 9.6  --  8.2* 8.0* 8.3*  PROT 8.1  --  5.8*  --  6.6  ALBUMIN 4.7  --  3.2*  --  3.5  AST 19  --  77*  --  63*  ALT 14  --  89*  --  130*  ALKPHOS 76  --  47  --  52  BILITOT 0.9  --  0.6  --  1.1  GFRNONAA >60  --  >60 >60 >60  GFRAA >60  --  >60 >60 >60  ANIONGAP 11  --  8 7 7    < > = values in this interval not displayed.     Hematology Recent Labs  Lab 11/07/18 1449  11/08/18 0300  11/09/18 0159 11/09/18 1528 11/10/18 0255  WBC 10.0  --  7.3  --   --   --  6.9  RBC 3.60*  --  3.32*  --   --   --  3.89*  HGB 9.9*   < > 9.4*   < > 10.4* 10.5* 11.1*  HCT 31.8*   < > 29.8*   < > 33.3* 33.5* 35.6*  MCV 88.3  --  89.8  --   --   --  91.5  MCH 27.5  --  28.3  --   --   --  28.5  MCHC 31.1  --  31.5  --   --   --  31.2  RDW 14.2  --  14.5  --   --   --  14.6  PLT 240  --  217  --   --   --  225   < > = values in this interval not displayed.    Cardiac EnzymesNo results for input(s): TROPONINI in the last 168 hours. No results for input(s):  TROPIPOC in the last 168 hours.   BNPNo results for input(s): BNP, PROBNP in the last 168 hours.   DDimer No results for input(s): DDIMER in the last 168 hours.   Radiology    No results found.  Cardiac Studies   November 07, 2018 Echo   1. The left ventricle appears to be normal in size, has moderate wall thickness 60-65% ejection fraction Spectral Doppler shows indeterminate pattern of diastolic filling.  2. The right ventricle has mildly enlarged size and normal systolic function.  3. Right ventricular systolic pressure is could not be assessed due to inability to visualize IVC.  4. Mildly dilated right atrial size.  5. Mitral valve regurgitation is trivial by color flow Doppler.  6. Tricuspid regurgitation is mild.  7. There is mild sclerosis of the aortic valve without stenosis.   Patient Profile     78 y.o. male with a history of PAFib, HTN, OSA, ulcerative colitisand admit with incarcerated hiatal hernia with mesenteroaxial gastric volvulus, gastric outlet obstruction and gastritis.  Assessment & Plan    1. PAT/MAT: We will transition to oral diltiazem.  The current arrhythmia does not have the same embolic risk as atrial fibrillation and he does not require immediate anticoagulation.  It is possible he has had true atrial fibrillation in the past, but he has refused anticoagulation anyway.  Normal left ventricular systolic function.   2. Cor pulmonale: The right ventricle is enlarged as is the right atrium.  This supports a diagnosis of probable multifocal atrial tachycardia which is common in patients with chronic lung disease.  I cannot say whether this is mostly due to inadequately treated obstructive sleep apnea or COPD (he does not use inhalers and has not smoked since the 1970s).  He reports intolerance to obstructive sleep apnea and uses a jaw advance for dental device.  It may appropriate to reassess how well this is treating his sleep apnea, as an outpatient. 3.   Postoperative day 3 for incarcerated hernia/gastric volvulus/gastric outlet obstruction: He is holding down clear liquids  and has had a couple of bowel movements.  Progressing well.  Eager to go home.     For questions or updates, please contact [place] Please consult [email or web address] for contact info under        Signed, Sanda Klein, MD  11/10/2018, 9:21 AM

## 2018-11-10 NOTE — Progress Notes (Signed)
PROGRESS NOTE    Daniel Dalton.  XUX:[id number] DOB: 07/11/1941 DOA: 11/07/2018 PCP: Prince Solian, MD    Brief Narrative:  78 y.o. male with PMH significant for A fib on aspirin, Ulcerative colitis on Sulfasalazine, hiatal hernia who presents with abdominal pain, pain is generalized that started after supper the night prior to admission. He describe pain as severe, constant, is not sharp, 10/10. Pain is located mid abdomen. He relates pain is associated with nausea and vomiting. He has vomited twice, one time in the ED. He vomited coffee ground emesis in the ED.  He report pain medication has decrease intensity if pain.  Patient currently denies chest pain, shortness of breath, cough, melena.   Assessment & Plan:   Principal Problem:   Mesenteroaxial gastric volvulus with obstruction s/p robotic reduction/repair/partial fundoplication 06/26/6059 Active Problems:   Atrial fibrillation with rapid ventricular response (HCC)   OSA (obstructive sleep apnea)   HTN (hypertension)   Ulcerative colitis ([place])   Hyperlipidemia   Gastric outlet obstruction   Coffee ground emesis   Incarcerated hiatal hernia   S/P robotic partial posterior [place] fundoplication 0/45/9977   Acute respiratory failure with hypoxia (HCC)   Hypophosphatemia   1-Gastric Outlet obstruction, Large hiatal hernia, Volvulus.  Surgery consulted and is following Pt now s/p surgery 1/29 Tolerating clears NG removed per Surgery Continued on protonix  2-Coffee-ground emesis; in the setting of gastric outlet obstruction. CBC reviewed. Hgb remains stable Transfusion as needed.  3-Sinus Tachycardia/MAT Agree with continuation of IV metoprolol until pt tolerates PO reliably Tachycardic overnight, requiring cardizem gtt Cardiology following Concerns for MAT. Pt has hx of OSA, historically not tolerated cpap. Will try CPAP while in hospital  4-A. Fib; Continued on IV beta blocker Per above, MAT overnight,  started cardizem gtt Plan to resume PO beta blocker when able to tolerate PO reliably Continue on tele  Pt had declined anticoagulation in past  5-Ulcerative colitis; CT abdomen with negative finding for acute flare Hold sulfasalazine until able to tolerate PO Stable at present  6-HTN;  IV metoprolol continued PRN Hydralazine as needed  DVT prophylaxis: SCD's Code Status: No intubation Family Communication: Pt in room, family not at bedside Disposition Plan: Uncertain at this time  Consultants:   General surery  Cardiology  Procedures:   Surgical repair of incarcerated hernia 1/29  Antimicrobials: Anti-infectives (From admission, onward)   Start     Dose/Rate Route Frequency Ordered Stop   11/07/18 2100  metroNIDAZOLE (FLAGYL) IVPB 500 mg     500 mg 100 mL/hr over 60 Minutes Intravenous Every 6 hours 11/07/18 1850 11/08/18 1017   11/07/18 1230  metroNIDAZOLE (FLAGYL) IVPB 500 mg     500 mg 100 mL/hr over 60 Minutes Intravenous On call to O.R. 11/07/18 1108 11/07/18 1458   11/07/18 1215  cefTRIAXone (ROCEPHIN) 2 g in sodium chloride 0.9 % 100 mL IVPB     2 g 200 mL/hr over 30 Minutes Intravenous On call to O.R. 11/07/18 1108 11/07/18 1513      Subjective: Eager to go home. Tolerating diet  Objective: Vitals:   11/10/18 0545 11/10/18 0600 11/10/18 0815 11/10/18 1107  BP: 137/90 (!) 141/69 (!) 154/68 (!) 142/74  Pulse: (!) 120 (!) 111 94 87  Resp:   20 16  Temp:   99.8 F (37.7 C) 98.4 F (36.9 C)  TempSrc:   Oral Oral  SpO2:   95% 97%  Weight:      Height:  Intake/Output Summary (Last 24 hours) at 11/10/2018 1301 Last data filed at 11/10/2018 0600 Gross per 24 hour  Intake 534.55 ml  Output 730 ml  Net -195.45 ml   Filed Weights   11/08/18 0340 11/09/18 0500 11/10/18 0407  Weight: 89.8 kg 90.7 kg 87 kg    Examination: General exam: Awake, laying in bed, in nad Respiratory system: Normal respiratory effort, no wheezing Cardiovascular  system: regular rate, s1, s2 Gastrointestinal system: Soft, nondistended, positive BS Central nervous system: CN2-12 grossly intact, strength intact Extremities: Perfused, no clubbing Skin: Normal skin turgor, no notable skin lesions seen Psychiatry: Mood normal // no visual hallucinations   Data Reviewed: I have personally reviewed following labs and imaging studies  CBC: Recent Labs  Lab 11/06/18 2159 11/07/18 1449  11/08/18 0300 11/08/18 1614 11/09/18 0159 11/09/18 1528 11/10/18 0255  WBC 8.5 10.0  --  7.3  --   --   --  6.9  HGB 12.5* 9.9*   < > 9.4* 10.4* 10.4* 10.5* 11.1*  HCT 38.9* 31.8*   < > 29.8* 33.8* 33.3* 33.5* 35.6*  MCV 87.6 88.3  --  89.8  --   --   --  91.5  PLT 296 240  --  217  --   --   --  225   < > = values in this interval not displayed.   Basic Metabolic Panel: Recent Labs  Lab 11/06/18 2159 11/07/18 1620 11/07/18 1659 11/08/18 0300 11/09/18 0159 11/10/18 0255  NA 140 139 139 138 139 138  K 4.3 3.8 3.9 3.3* 3.8 3.8  CL 105  --   --  110 110 109  CO2 24  --   --  20* 22 22  GLUCOSE 130*  --   --  121* 120* 128*  BUN 20  --   --  17 14 13   CREATININE 0.82  --   --  0.78 0.75 0.80  CALCIUM 9.6  --   --  8.2* 8.0* 8.3*  MG  --   --   --  1.5* 1.8 2.0  PHOS  --   --   --  <1.0* 2.0* 2.2*   GFR: Estimated Creatinine Clearance: 77.3 mL/min (by C-G formula based on SCr of 0.8 mg/dL). Liver Function Tests: Recent Labs  Lab 11/06/18 2159 11/08/18 0300 11/10/18 0255  AST 19 77* 63*  ALT 14 89* 130*  ALKPHOS 76 47 52  BILITOT 0.9 0.6 1.1  PROT 8.1 5.8* 6.6  ALBUMIN 4.7 3.2* 3.5   Recent Labs  Lab 11/06/18 2159  LIPASE 37   No results for input(s): AMMONIA in the last 168 hours. Coagulation Profile: Recent Labs  Lab 11/07/18 0517  INR 0.97   Cardiac Enzymes: No results for input(s): CKTOTAL, CKMB, CKMBINDEX, TROPONINI in the last 168 hours. BNP (last 3 results) No results for input(s): PROBNP in the last 8760 hours. HbA1C: No  results for input(s): HGBA1C in the last 72 hours. CBG: No results for input(s): GLUCAP in the last 168 hours. Lipid Profile: Recent Labs    11/07/18 1908  TRIG 21   Thyroid Function Tests: No results for input(s): TSH, T4TOTAL, FREET4, T3FREE, THYROIDAB in the last 72 hours. Anemia Panel: No results for input(s): VITAMINB12, FOLATE, FERRITIN, TIBC, IRON, RETICCTPCT in the last 72 hours. Sepsis Labs: Recent Labs  Lab 11/07/18 1132 11/07/18 1858 11/08/18 0300 11/09/18 0159  PROCALCITON  --  <0.10 <0.10 <0.10  LATICACIDVEN 1.2 1.9 1.4  --  No results found for this or any previous visit (from the past 240 hour(s)).   Radiology Studies: Dg Esophagus W Single Cm (sol Or Thin Ba)  Result Date: 11/10/2018 CLINICAL DATA:  Post Nissen fundoplication surgery on 78/97/8478. EXAM: ESOPHOGRAM/BARIUM SWALLOW TECHNIQUE: Single contrast examination was performed using water-soluble contrast. FLUOROSCOPY TIME:  48 seconds (14.8 mGy) COMPARISON:  Chest CT - 11/07/2018 FINDINGS: Ingested contrast passes freely through the distal esophagus and GE junction without evidence of occlusion, stricture or contrast extravasation. IMPRESSION: No evidence of complication following Nissen fundoplication surgery. Specifically, no evidence of stricture or leak. Electronically Signed   By: Sandi Mariscal M.D.   On: 11/10/2018 11:14    Scheduled Meds: . chlorhexidine gluconate (MEDLINE KIT)  15 mL Mouth Rinse BID  . lip balm  1 application Topical BID  . mouth rinse  15 mL Mouth Rinse BID  . metoprolol tartrate  5 mg Intravenous Q6H  . pantoprazole  40 mg Intravenous Q12H   Continuous Infusions: . dextrose 5 % and 0.9% NaCl 30 mL/hr at 11/10/18 1237  . diltiazem (CARDIZEM) infusion 5 mg/hr (11/10/18 1239)  . fentaNYL infusion INTRAVENOUS Stopped (11/08/18 0735)  . ondansetron (ZOFRAN) IV    . potassium PHOSPHATE IVPB (in mmol) 30 mmol (11/10/18 1215)     LOS: 3 days   Marylu Lund, MD [facility] Pager On Amion  If 7PM-7AM, please contact night-coverage 11/10/2018, 1:01 PM

## 2018-11-10 NOTE — Progress Notes (Signed)
Attempted CPAP as ordered, however, patient wears mask for less than 1 second and is pulling off mask first stating that he cant breathe, so pressure adjustments are made several times and attempt is made again.  This time patient states that the mask is closing off his nostrils and he cant breathe and again rips the mask off in less than 1 second.  Patient is not open to the idea of wearing CPAP.

## 2018-11-10 NOTE — Progress Notes (Signed)
Duplicate/error

## 2018-11-11 DIAGNOSIS — G4733 Obstructive sleep apnea (adult) (pediatric): Secondary | ICD-10-CM

## 2018-11-11 LAB — BASIC METABOLIC PANEL
Anion gap: 7 (ref 5–15)
BUN: 15 mg/dL (ref 8–23)
CO2: 22 mmol/L (ref 22–32)
Calcium: 8 mg/dL — ABNORMAL LOW (ref 8.9–10.3)
Chloride: 109 mmol/L (ref 98–111)
Creatinine, Ser: 0.78 mg/dL (ref 0.61–1.24)
GFR calc non Af Amer: >60 mL/min (ref >60)
Glucose, Bld: 124 mg/dL — ABNORMAL HIGH (ref 70–99)
Potassium: 3.8 mmol/L (ref 3.5–5.1)
Sodium: 138 mmol/L (ref 135–145)

## 2018-11-11 LAB — HEMOGLOBIN AND HEMATOCRIT, BLOOD
HCT: 30.4 % — ABNORMAL LOW (ref 39.0–52.0)
HEMATOCRIT: 36.7 % — AB (ref 39.0–52.0)
HEMOGLOBIN: 9.5 g/dL — AB (ref 13.0–17.0)
Hemoglobin: 11.3 g/dL — ABNORMAL LOW (ref 13.0–17.0)

## 2018-11-11 LAB — PHOSPHORUS: Phosphorus: 3 mg/dL (ref 2.5–4.6)

## 2018-11-11 MED ORDER — METOPROLOL TARTRATE 50 MG PO TABS
50.0000 mg | ORAL_TABLET | Freq: Two times a day (BID) | ORAL | Status: DC
  Administered 2018-11-11 – 2018-11-12 (×2): 50 mg via ORAL
  Filled 2018-11-11 (×2): qty 1

## 2018-11-11 MED ORDER — METOPROLOL TARTRATE 25 MG PO TABS
25.0000 mg | ORAL_TABLET | Freq: Two times a day (BID) | ORAL | Status: DC
  Administered 2018-11-11: 25 mg via ORAL
  Filled 2018-11-11: qty 1

## 2018-11-11 MED ORDER — METOPROLOL TARTRATE 25 MG PO TABS
25.0000 mg | ORAL_TABLET | Freq: Once | ORAL | Status: AC
  Administered 2018-11-11: 25 mg via ORAL
  Filled 2018-11-11: qty 1

## 2018-11-11 MED ORDER — LABETALOL HCL 5 MG/ML IV SOLN
5.0000 mg | INTRAVENOUS | Status: DC | PRN
  Filled 2018-11-11: qty 4

## 2018-11-11 NOTE — Progress Notes (Signed)
Pt. Unable to tolerate CPAP per patient.  Will be available if he changes his mind.

## 2018-11-11 NOTE — Progress Notes (Signed)
Pt noted to be in NSR HR 70s. CCMD confirms pt converted around 1740.

## 2018-11-11 NOTE — Progress Notes (Signed)
Pt ambulated in hall w/ steady gait w/ RW and stand by assist. Pt afib to 140s with ambulation but denies SOB/dizziness/palpitations. Pt HR back to 90-100s after settling in chair post ambulation. Will continue to monitor.

## 2018-11-11 NOTE — Progress Notes (Signed)
4 Days Post-Op   Subjective/Chief Complaint: HR back up in the 140's. On cardizem drip at 53m/hr. Otherwise feels ok   Objective: Vital signs in last 24 hours: Temp:  [98 F (36.7 C)-98.4 F (36.9 C)] 98 F (36.7 C) (02/02 0610) Pulse Rate:  [61-98] 76 (02/02 0610) Resp:  [16-26] 20 (02/02 0610) BP: (114-142)/(67-81) 120/81 (02/02 0843) SpO2:  [92 %-97 %] 92 % (02/02 0610) Weight:  [88.5 kg] 88.5 kg (02/02 0500) Last BM Date: 11/10/18  Intake/Output from previous day: 02/01 0701 - 02/02 0700 In: 930 [P.O.:530; I.V.:336; IV Piggyback:64] Out: 505 [Urine:500; Drains:5] Intake/Output this shift: No intake/output data recorded.  General appearance: alert and cooperative Resp: clear to auscultation bilaterally Cardio: regular rate and rhythm and tachy GI: soft, minimal tenderness. drain output serosanguinous  Lab Results:  Recent Labs    11/10/18 0255 11/10/18 1424 11/11/18 0228  WBC 6.9  --   --   HGB 11.1* 11.0* 9.5*  HCT 35.6* 36.1* 30.4*  PLT 225  --   --    BMET Recent Labs    11/10/18 0255 11/11/18 0228  NA 138 138  K 3.8 3.8  CL 109 109  CO2 22 22  GLUCOSE 128* 124*  BUN 13 15  CREATININE 0.80 0.78  CALCIUM 8.3* 8.0*   PT/INR No results for input(s): LABPROT, INR in the last 72 hours. ABG No results for input(s): PHART, HCO3 in the last 72 hours.  Invalid input(s): PCO2, PO2  Studies/Results: Dg Esophagus W Single Cm (sol Or Thin Ba)  Result Date: 11/10/2018 CLINICAL DATA:  Post Nissen fundoplication surgery on 026/33/3545 EXAM: ESOPHOGRAM/BARIUM SWALLOW TECHNIQUE: Single contrast examination was performed using water-soluble contrast. FLUOROSCOPY TIME:  48 seconds (14.8 mGy) COMPARISON:  Chest CT - 11/07/2018 FINDINGS: Ingested contrast passes freely through the distal esophagus and GE junction without evidence of occlusion, stricture or contrast extravasation. IMPRESSION: No evidence of complication following Nissen fundoplication surgery.  Specifically, no evidence of stricture or leak. Electronically Signed   By: JSandi MariscalM.D.   On: 11/10/2018 11:14    Anti-infectives: Anti-infectives (From admission, onward)   Start     Dose/Rate Route Frequency Ordered Stop   11/07/18 2100  metroNIDAZOLE (FLAGYL) IVPB 500 mg     500 mg 100 mL/hr over 60 Minutes Intravenous Every 6 hours 11/07/18 1850 11/08/18 1017   11/07/18 1230  metroNIDAZOLE (FLAGYL) IVPB 500 mg     500 mg 100 mL/hr over 60 Minutes Intravenous On call to O.R. 11/07/18 1108 11/07/18 1458   11/07/18 1215  cefTRIAXone (ROCEPHIN) 2 g in sodium chloride 0.9 % 100 mL IVPB     2 g 200 mL/hr over 30 Minutes Intravenous On call to O.R. 11/07/18 1108 11/07/18 1513      Assessment/Plan: s/p Procedure(s): XI ROBOTIC ASSISTED REDUCTION AND REPAIR OF HIATAL HERNIA. TOUPET (PARTIAL POSTERIOR FUNDOPLICATION) , EGD (N/A) Continue on Dysphagia 1 diet  Tachycardia. Back on IV cardizem. May need to increase. Cards following Ambulate once HR is under control  LOS: 4 days    PAutumn MessingIII 11/11/2018

## 2018-11-11 NOTE — Progress Notes (Signed)
HR sustaining 130-160s after up to [facility] despite Card gtt @5mg /hr. BP stable, gtt increased to 10mg /hr per orders. Pt continues to be asymptomatic. Will monitor closely.

## 2018-11-11 NOTE — Progress Notes (Signed)
Progress Note  Patient Name: Daniel Dalton. Date of Encounter: 11/11/2018  Primary Cardiologist: Truitt Merle, NP   Subjective   Minimally aware of the arrhythmia. Diltiazem stopped last night when HR decreased to 50s. At 0730h went into true after with RVR 150s. Now on diltiazem IV.  Feels well. Wants to walk.  Inpatient Medications    Scheduled Meds: . acetaminophen  1,000 mg Oral TID  . aspirin  81 mg Oral Daily  . chlorhexidine gluconate (MEDLINE KIT)  15 mL Mouth Rinse BID  . gabapentin  300 mg Oral QHS  . irbesartan  300 mg Oral Daily  . lip balm  1 application Topical BID  . mouth rinse  15 mL Mouth Rinse BID  . metoprolol tartrate  25 mg Oral BID  . pantoprazole  40 mg Intravenous Q12H  . sodium chloride flush  3 mL Intravenous Q12H  . sulfaSALAzine  2,000 mg Oral BID   Continuous Infusions: . sodium chloride    . diltiazem (CARDIZEM) infusion 10 mg/hr (11/11/18 0845)  . fentaNYL infusion INTRAVENOUS Stopped (11/08/18 0735)  . lactated ringers    . methocarbamol (ROBAXIN) IV    . ondansetron (ZOFRAN) IV     PRN Meds: sodium chloride, bisacodyl, diphenhydrAMINE, guaiFENesin-dextromethorphan, hydrALAZINE, hydrocortisone, hydrocortisone cream, lactated ringers, magic mouthwash, methocarbamol (ROBAXIN) IV, methocarbamol, metoprolol tartrate, ondansetron (ZOFRAN) IV **OR** ondansetron (ZOFRAN) IV, ondansetron **OR** [DISCONTINUED] ondansetron (ZOFRAN) IV, phenol, prochlorperazine, sodium chloride flush, traMADol   Vital Signs    Vitals:   11/11/18 0610 11/11/18 0801 11/11/18 0843 11/11/18 0915  BP: 125/70 127/80 120/81 (!) 143/78  Pulse: 76   (!) 146  Resp: 20     Temp: 98 F (36.7 C)     TempSrc: Oral     SpO2: 92%     Weight:      Height:        Intake/Output Summary (Last 24 hours) at 11/11/2018 0950 Last data filed at 11/11/2018 0835 Gross per 24 hour  Intake 1170 ml  Output 505 ml  Net 665 ml   Last 3 Weights 11/11/2018 11/10/2018 11/09/2018    Weight (lbs) 195 lb 1.7 oz 191 lb 11.2 oz 199 lb 15.3 oz  Weight (kg) 88.5 kg 86.955 kg 90.7 kg      Telemetry    AFib w RVR - Personally Reviewed  ECG    Afib w RVR - Personally Reviewed  Physical Exam  Looks comfortable GEN: No acute distress.   Neck: No JVD Cardiac: irregular, no murmurs, rubs, or gallops.  Respiratory: Clear to auscultation bilaterally. GI: Soft, nontender, non-distended, healthy surgical scar  MS: No edema; No deformity. Neuro:  Nonfocal  Psych: Normal affect   Labs    Chemistry Recent Labs  Lab 11/06/18 2159  11/08/18 0300 11/09/18 0159 11/10/18 0255 11/11/18 0228  NA 140   < > 138 139 138 138  K 4.3   < > 3.3* 3.8 3.8 3.8  CL 105  --  110 110 109 109  CO2 24  --  20* _0 GLUCOSE 130*  --  121* 120* 128* 124*  BUN 20  --  _1 CREATININE 0.82  --  0.78 0.75 0.80 0.78  CALCIUM 9.6  --  8.2* 8.0* 8.3* 8.0*  PROT 8.1  --  5.8*  --  6.6  --   ALBUMIN 4.7  --  3.2*  --  3.5  --   AST 19  --  77*  --  63*  --   ALT 14  --  89*  --  130*  --   ALKPHOS 76  --  47  --  52  --   BILITOT 0.9  --  0.6  --  1.1  --   GFRNONAA >60  --  >60 >60 >60 >60  GFRAA >60  --  >60 >60 >60 >60  ANIONGAP 11  --  _0 < > = values in this interval not displayed.     Hematology Recent Labs  Lab 11/07/18 1449  11/08/18 0300  11/10/18 0255 11/10/18 1424 11/11/18 0228  WBC 10.0  --  7.3  --  6.9  --   --   RBC 3.60*  --  3.32*  --  3.89*  --   --   HGB 9.9*   < > 9.4*   < > 11.1* 11.0* 9.5*  HCT 31.8*   < > 29.8*   < > 35.6* 36.1* 30.4*  MCV 88.3  --  89.8  --  91.5  --   --   MCH 27.5  --  28.3  --  28.5  --   --   MCHC 31.1  --  31.5  --  31.2  --   --   RDW 14.2  --  14.5  --  14.6  --   --   PLT 240  --  217  --  225  --   --    < > = values in this interval not displayed.    Cardiac EnzymesNo results for input(s): TROPONINI in the last 168 hours. No results for input(s): TROPIPOC in the last 168 hours.   BNPNo results for  input(s): BNP, PROBNP in the last 168 hours.   DDimer No results for input(s): DDIMER in the last 168 hours.   Radiology    Dg Esophagus W Single Cm (sol Or Thin Ba)  Result Date: 11/10/2018 CLINICAL DATA:  Post Nissen fundoplication surgery on 60/60/0459. EXAM: ESOPHOGRAM/BARIUM SWALLOW TECHNIQUE: Single contrast examination was performed using water-soluble contrast. FLUOROSCOPY TIME:  48 seconds (14.8 mGy) COMPARISON:  Chest CT - 11/07/2018 FINDINGS: Ingested contrast passes freely through the distal esophagus and GE junction without evidence of occlusion, stricture or contrast extravasation. IMPRESSION: No evidence of complication following Nissen fundoplication surgery. Specifically, no evidence of stricture or leak. Electronically Signed   By: Sandi Mariscal M.D.   On: 11/10/2018 11:14    Cardiac Studies   11/07/2018 Echo  1. The left ventricle appears to be normal in size, has moderate wall thickness 60-65% ejection fraction Spectral Doppler shows indeterminate pattern of diastolic filling.  2. The right ventricle has mildly enlarged size and normal systolic function.  3. Right ventricular systolic pressure is could not be assessed due to inability to visualize IVC.  4. Mildly dilated right atrial size.  5. Mitral valve regurgitation is trivial by color flow Doppler.  6. Tricuspid regurgitation is mild.  7. There is mild sclerosis of the aortic valve without stenosis  Patient Profile     78 y.o. male a history of PAFib, HTN, OSA, ulcerative colitisand admit with incarcerated hiatal hernia with mesenteroaxial gastric volvulus, gastric outlet obstruction and gastritis. Had MAT yesterday, now in atrial fibrillation with RVR.  Assessment & Plan    1. AFib: likely related to inadvertent beta blocker interruption due to gastric obstruction/surgery. Increase beta blocker PO and try to gradually wean down diltiazem. He  has been resistant to chronic anticoagulation. 2. Cor pulmonale: echo c/w  chronic R heart overload. Most likely due to delayed or inadequate treatment for OSA. Uses a jaw advancement device, not sure if sleep study was ever repeated with the device. 3. Postoperative day 3 for incarcerated hernia/gastric volvulus/gastric outlet obstruction: postop progress has otherwise been good.     For questions or updates, please contact [place] Please consult [email or web address] for contact info under        Signed, Sanda Klein, MD  11/11/2018, 9:50 AM

## 2018-11-11 NOTE — Progress Notes (Signed)
Pt HR sustaining 120-140s while pt at rest, eating bkfst. Denies SOB/palpitations/dizziness. Cardizem gtt restarted at 5mg /hr per order. Will monitor closely.

## 2018-11-11 NOTE — Progress Notes (Signed)
PROGRESS NOTE    Daniel Dalton.  MEQ:[id number] DOB: 1941-03-11 DOA: 11/07/2018 PCP: Prince Solian, MD    Brief Narrative:  78 y.o. male with PMH significant for A fib on aspirin, Ulcerative colitis on Sulfasalazine, hiatal hernia who presents with abdominal pain, pain is generalized that started after supper the night prior to admission. He describe pain as severe, constant, is not sharp, 10/10. Pain is located mid abdomen. He relates pain is associated with nausea and vomiting. He has vomited twice, one time in the ED. He vomited coffee ground emesis in the ED.  He report pain medication has decrease intensity if pain.  Patient currently denies chest pain, shortness of breath, cough, melena.   Assessment & Plan:   Principal Problem:   Mesenteroaxial gastric volvulus with obstruction s/p robotic reduction/repair/partial fundoplication 2/97/9892 Active Problems:   OSA (obstructive sleep apnea)   HTN (hypertension)   Ulcerative colitis ([place])   Hyperlipidemia   Gastric outlet obstruction   Coffee ground emesis   Incarcerated hiatal hernia   S/P robotic partial posterior [place] fundoplication 10/28/4172   Acute respiratory failure with hypoxia ([facility])   Hypophosphatemia   Multifocal atrial tachycardia ([facility])   1-Gastric Outlet obstruction, Large hiatal hernia, Volvulus.  Surgery consulted and is following Pt now s/p surgery 1/29 Tolerating diet thus far per surgery  2-Coffee-ground emesis; in the setting of gastric outlet obstruction. CBC reviewed. Hgb remains stable Repeat cbc in AM.  3-Recent Sinus Tachycardia/MAT Agree with continuation of IV metoprolol until pt tolerates PO reliably Tachycardic overnight, requiring cardizem gtt Cardiology following Recent concerns for MAT. Pt has hx of OSA, tried CPAP, however did not tolerate  4-A. Fib with RVR; New episode of RVR this AM Continued on IV beta blocker PRN Resumed cardizem gtt, plan to wean down per Cardiology  recs Plan to resume PO beta blocker when able to tolerate PO reliably Continue on tele  Pt had declined anticoagulation in past Discussed with Cardiology. Recommendation for anticoagulation while in hospital if OK with surgery  5-Ulcerative colitis; CT abdomen with negative finding for acute flare Remains stable at present  6-HTN;  IV metoprolol continued PRN Hydralazine as needed  DVT prophylaxis: SCD's Code Status: No intubation Family Communication: Pt in room, family at bedside Disposition Plan: Uncertain at this time  Consultants:   General surery  Cardiology  Procedures:   Surgical repair of incarcerated hernia 1/29  Antimicrobials: Anti-infectives (From admission, onward)   Start     Dose/Rate Route Frequency Ordered Stop   11/07/18 2100  metroNIDAZOLE (FLAGYL) IVPB 500 mg     500 mg 100 mL/hr over 60 Minutes Intravenous Every 6 hours 11/07/18 1850 11/08/18 1017   11/07/18 1230  metroNIDAZOLE (FLAGYL) IVPB 500 mg     500 mg 100 mL/hr over 60 Minutes Intravenous On call to O.R. 11/07/18 1108 11/07/18 1458   11/07/18 1215  cefTRIAXone (ROCEPHIN) 2 g in sodium chloride 0.9 % 100 mL IVPB     2 g 200 mL/hr over 30 Minutes Intravenous On call to O.R. 11/07/18 1108 11/07/18 1513      Subjective: Questioning about going home. Not sob  Objective: Vitals:   11/11/18 1058 11/11/18 1140 11/11/18 1225 11/11/18 1323  BP: 112/81 108/69 117/81 103/68  Pulse: (!) 108 (!) 105 82 80  Resp:      Temp:   99 F (37.2 C)   TempSrc:   Axillary   SpO2:   99%   Weight:  Height:        Intake/Output Summary (Last 24 hours) at 11/11/2018 1427 Last data filed at 11/11/2018 0835 Gross per 24 hour  Intake 1170 ml  Output 505 ml  Net 665 ml   Filed Weights   11/09/18 0500 11/10/18 0407 11/11/18 0500  Weight: 90.7 kg 87 kg 88.5 kg    Examination: General exam: Conversant, in no acute distress Respiratory system: normal chest rise, clear, no audible  wheezing Cardiovascular system: irregularly irregular, s1-s2 Gastrointestinal system: Nondistended, nontender, pos BS Central nervous system: No seizures, no tremors Extremities: No cyanosis, no joint deformities Skin: No rashes, no pallor Psychiatry: Affect normal // no auditory hallucinations   Data Reviewed: I have personally reviewed following labs and imaging studies  CBC: Recent Labs  Lab 11/06/18 2159 11/07/18 1449  11/08/18 0300  11/09/18 0159 11/09/18 1528 11/10/18 0255 11/10/18 1424 11/11/18 0228  WBC 8.5 10.0  --  7.3  --   --   --  6.9  --   --   HGB 12.5* 9.9*   < > 9.4*   < > 10.4* 10.5* 11.1* 11.0* 9.5*  HCT 38.9* 31.8*   < > 29.8*   < > 33.3* 33.5* 35.6* 36.1* 30.4*  MCV 87.6 88.3  --  89.8  --   --   --  91.5  --   --   PLT 296 240  --  217  --   --   --  225  --   --    < > = values in this interval not displayed.   Basic Metabolic Panel: Recent Labs  Lab 11/06/18 2159  11/07/18 1659 11/08/18 0300 11/09/18 0159 11/10/18 0255 11/11/18 0228  NA 140   < > 139 138 139 138 138  K 4.3   < > 3.9 3.3* 3.8 3.8 3.8  CL 105  --   --  110 110 109 109  CO2 24  --   --  20* 22 22 22   GLUCOSE 130*  --   --  121* 120* 128* 124*  BUN 20  --   --  17 14 13 15   CREATININE 0.82  --   --  0.78 0.75 0.80 0.78  CALCIUM 9.6  --   --  8.2* 8.0* 8.3* 8.0*  MG  --   --   --  1.5* 1.8 2.0  --   PHOS  --   --   --  <1.0* 2.0* 2.2* 3.0   < > = values in this interval not displayed.   GFR: Estimated Creatinine Clearance: 83.9 mL/min (by C-G formula based on SCr of 0.78 mg/dL). Liver Function Tests: Recent Labs  Lab 11/06/18 2159 11/08/18 0300 11/10/18 0255  AST 19 77* 63*  ALT 14 89* 130*  ALKPHOS 76 47 52  BILITOT 0.9 0.6 1.1  PROT 8.1 5.8* 6.6  ALBUMIN 4.7 3.2* 3.5   Recent Labs  Lab 11/06/18 2159  LIPASE 37   No results for input(s): AMMONIA in the last 168 hours. Coagulation Profile: Recent Labs  Lab 11/07/18 0517  INR 0.97   Cardiac Enzymes: No  results for input(s): CKTOTAL, CKMB, CKMBINDEX, TROPONINI in the last 168 hours. BNP (last 3 results) No results for input(s): PROBNP in the last 8760 hours. HbA1C: No results for input(s): HGBA1C in the last 72 hours. CBG: No results for input(s): GLUCAP in the last 168 hours. Lipid Profile: No results for input(s): CHOL, HDL, LDLCALC, TRIG, CHOLHDL, LDLDIRECT in the  last 72 hours. Thyroid Function Tests: No results for input(s): TSH, T4TOTAL, FREET4, T3FREE, THYROIDAB in the last 72 hours. Anemia Panel: No results for input(s): VITAMINB12, FOLATE, FERRITIN, TIBC, IRON, RETICCTPCT in the last 72 hours. Sepsis Labs: Recent Labs  Lab 11/07/18 1132 11/07/18 1858 11/08/18 0300 11/09/18 0159  PROCALCITON  --  <0.10 <0.10 <0.10  LATICACIDVEN 1.2 1.9 1.4  --     No results found for this or any previous visit (from the past 240 hour(s)).   Radiology Studies: Dg Esophagus W Single Cm (sol Or Thin Ba)  Result Date: 11/10/2018 CLINICAL DATA:  Post Nissen fundoplication surgery on 59/16/3846. EXAM: ESOPHOGRAM/BARIUM SWALLOW TECHNIQUE: Single contrast examination was performed using water-soluble contrast. FLUOROSCOPY TIME:  48 seconds (14.8 mGy) COMPARISON:  Chest CT - 11/07/2018 FINDINGS: Ingested contrast passes freely through the distal esophagus and GE junction without evidence of occlusion, stricture or contrast extravasation. IMPRESSION: No evidence of complication following Nissen fundoplication surgery. Specifically, no evidence of stricture or leak. Electronically Signed   By: Sandi Mariscal M.D.   On: 11/10/2018 11:14    Scheduled Meds: . acetaminophen  1,000 mg Oral TID  . aspirin  81 mg Oral Daily  . chlorhexidine gluconate (MEDLINE KIT)  15 mL Mouth Rinse BID  . gabapentin  300 mg Oral QHS  . irbesartan  300 mg Oral Daily  . lip balm  1 application Topical BID  . mouth rinse  15 mL Mouth Rinse BID  . metoprolol tartrate  50 mg Oral BID  . pantoprazole  40 mg Intravenous Q12H   . sodium chloride flush  3 mL Intravenous Q12H  . sulfaSALAzine  2,000 mg Oral BID   Continuous Infusions: . sodium chloride    . diltiazem (CARDIZEM) infusion 5 mg/hr (11/11/18 1323)  . fentaNYL infusion INTRAVENOUS Stopped (11/08/18 0735)  . lactated ringers    . methocarbamol (ROBAXIN) IV    . ondansetron (ZOFRAN) IV       LOS: 4 days   Marylu Lund, MD [facility] Pager On Amion  If 7PM-7AM, please contact night-coverage 11/11/2018, 2:27 PM

## 2018-11-11 NOTE — Progress Notes (Signed)
Pt HR sustained between 50-60 bpm for several minutes. Per MD order, Cardizem gtt is to be stopped if HR < 60. Drip was stopped at 0306. Pt showed no s/s off distress and had no complaints. This RN will continue to monitor the pt closely.

## 2018-11-11 NOTE — Plan of Care (Signed)
  Problem: Health Behavior/Discharge Planning: Goal: Ability to manage health-related needs will improve Outcome: Progressing   Problem: Clinical Measurements: Goal: Ability to maintain clinical measurements within normal limits will improve Outcome: Progressing Note:  Pt converted to afib w/ RVR this AM. Card gtt restarted and Po lopressor increased. HR improving.  Goal: Diagnostic test results will improve Outcome: Progressing Goal: Cardiovascular complication will be avoided Outcome: Progressing   Problem: Activity: Goal: Risk for activity intolerance will decrease Outcome: Progressing   Problem: Nutrition: Goal: Adequate nutrition will be maintained Outcome: Progressing   Problem: Pain Managment: Goal: General experience of comfort will improve Outcome: Progressing   Problem: Skin Integrity: Goal: Risk for impaired skin integrity will decrease Outcome: Progressing   Problem: Education: Goal: Required Educational Video(s) Outcome: Progressing   Problem: Clinical Measurements: Goal: Ability to maintain clinical measurements within normal limits will improve Outcome: Progressing Goal: Postoperative complications will be avoided or minimized Outcome: Progressing   Problem: Skin Integrity: Goal: Demonstration of wound healing without infection will improve Outcome: Progressing

## 2018-11-11 NOTE — Plan of Care (Signed)
Pt was up brushing his teeth at the sink and ambulated to the bathroom with no assistance or assistive device. Pt tolerated well and had no complaints.

## 2018-11-12 LAB — BASIC METABOLIC PANEL
Anion gap: 6 (ref 5–15)
BUN: 13 mg/dL (ref 8–23)
CO2: 22 mmol/L (ref 22–32)
Calcium: 8.1 mg/dL — ABNORMAL LOW (ref 8.9–10.3)
Chloride: 109 mmol/L (ref 98–111)
Creatinine, Ser: 0.82 mg/dL (ref 0.61–1.24)
GFR calc Af Amer: >60 mL/min (ref >60)
GFR calc non Af Amer: >60 mL/min (ref >60)
GLUCOSE: 107 mg/dL — AB (ref 70–99)
Potassium: 4 mmol/L (ref 3.5–5.1)
Sodium: 137 mmol/L (ref 135–145)

## 2018-11-12 LAB — HEMOGLOBIN AND HEMATOCRIT, BLOOD
HCT: 32.2 % — ABNORMAL LOW (ref 39.0–52.0)
Hemoglobin: 9.9 g/dL — ABNORMAL LOW (ref 13.0–17.0)

## 2018-11-12 LAB — PHOSPHORUS: PHOSPHORUS: 3.1 mg/dL (ref 2.5–4.6)

## 2018-11-12 MED ORDER — ONDANSETRON HCL 4 MG PO TABS: 4.0000 mg | ORAL_TABLET | Freq: Three times a day (TID) | ORAL | 20 refills | Status: DC | PRN

## 2018-11-12 MED ORDER — METOPROLOL TARTRATE 50 MG PO TABS: 50.0000 mg | ORAL_TABLET | Freq: Two times a day (BID) | ORAL | 0 refills | Status: DC

## 2018-11-12 MED ORDER — TRAMADOL HCL 50 MG PO TABS: 50.0000 mg | ORAL_TABLET | Freq: Four times a day (QID) | ORAL | 0 refills | Status: DC | PRN

## 2018-11-12 MED ORDER — PROMETHAZINE HCL 25 MG RE SUPP: 25.0000 mg | Freq: Four times a day (QID) | RECTAL | 20 refills | Status: DC | PRN

## 2018-11-12 NOTE — Care Management Important Message (Signed)
Important Message  Patient Details  Name: Jaser Fullen. MRN: [id number] Date of Birth: 11/05/1940   Medicare Important Message Given:  Yes    Kerin Salen 11/12/2018, 12:14 [place] Message  Patient Details  Name: Johari Pinney. MRN: [id number] Date of Birth: 1941/04/30   Medicare Important Message Given:  Yes    Kerin Salen 11/12/2018, 12:14 PM

## 2018-11-12 NOTE — Progress Notes (Signed)
[address] [place] Born [id number] 1941-01-22  CARE TEAM:  PCP: Prince Solian, MD  Outpatient Care Team: Patient Care Team: Prince Solian, MD as PCP - General (Internal Medicine) Burtis Junes, NP as PCP - Cardiology (Nurse Practitioner) Clarene Essex, MD as Consulting Physician (Gastroenterology) Michael Boston, MD as Consulting Physician (General Surgery) Larey Dresser, MD as Consulting Physician (Cardiology)  Inpatient Treatment Team: Treatment Team: Attending Provider: Donne Hazel, MD; Consulting Physician: Nolon Nations, MD; Consulting Physician: Lbcardiology, Michae Kava, MD; Rounding Team: Garner Gavel, MD; Student Nurse: Camie Patience, Student-RN; Registered Nurse: Gentry Roch, RN; Technician: Layla Maw, NT; Technician: Loree Fee, NT; Registered Nurse: Kerrin Mo, RN; Technician: Velna Hatchet, [place]   Problem List:   Principal Problem:   Mesenteroaxial gastric volvulus with obstruction s/p robotic reduction/repair/partial fundoplication 4/46/2863 Active Problems:   Incarcerated hiatal hernia   S/P robotic partial posterior Toupet fundoplication 05/27/7115   Acute respiratory failure with hypoxia ([place])   Multifocal atrial tachycardia ([facility])   OSA (obstructive sleep apnea)   HTN (hypertension)   Ulcerative colitis ([place])   Hyperlipidemia   Gastric outlet obstruction   Coffee ground emesis   Hypophosphatemia   5 Days Post-Op  11/07/2018  POST-OPERATIVE DIAGNOSIS:  Incarcerated hiatal hernia with mesenteroaxial gastric volvulus Gastric outlet obstruction Gastritis  PROCEDURE:   1. ROBOTIC reduction of paraesophageal hiatal hernia 2. Type II mediastinal dissection. 3. Primary repair of hiatal hernia over pledgets.  4. Anterior & posterior gastropexy. 5. Toupet (partial 240 degree posterior) fundoplication x 3.5 cm 6. EGD  SURGEON:  Adin Hector, MD    Assessment  Improving   [facility] Stay = 5  days)  Plan:  Dysphasia 1 pured diet as tolerated for the next few weeks.  Gradually advance to a regular solid diet over the next couple of months.    Mediastinal drain with minimal output.  Go ahead and remove.  Okay to discharge from surgery standpoint.  Defer to when tachycardia/bradycardia issues stabilized.  Sounds like it is getting easier to manage   Nausea and pain medicine prescriptions sent.    Have him follow-up in the office around 3 weeks.    20 minutes spent in review, evaluation, examination, counseling, and coordination of care.  More than 50% of that time was spent in counseling.  11/12/2018    Subjective: (Chief complaint)  Few events on the telemetry floor.  Tolerating dysphagia 1 diet.  Denies pain or nausea.  He wishes to go home    Objective:  Vital signs:  Vitals:   11/11/18 1623 11/11/18 2038 11/12/18 0203 11/12/18 0546  BP: 127/73 117/67 (!) 116/55 129/74  Pulse: 75 77 69 74  Resp: (!) 22 (!) 22 18 (!) 24  Temp: (!) 97.5 F (36.4 C) 98 F (36.7 C) 98.2 F (36.8 C) 98.6 F (37 C)  TempSrc: Axillary Oral Oral Oral  SpO2: 97% 96% 91% 93%  Weight:    88.3 kg  Height:        Last BM Date: 11/10/18  Intake/Output   Yesterday:  02/02 0701 - 02/03 0700 In: 579 [P.O.:890; I.V.:3] Out: 270 [Urine:250; Drains:20] This shift:  No intake/output data recorded.  Bowel function:  Flatus: YES  BM:  YES   19 French Blake surgical drain goes into mediastinum: Serosanguineous thin output   Physical Exam:  General: Pt awake/alert/oriented x4 in NO acute distress.  Alert and chatty. Eyes: PERRL, normal EOM.  Sclera clear.  No icterus Neuro: CN  II-XII intact w/o focal sensory/motor deficits. Lymph: No head/neck/groin lymphadenopathy Psych:  No delerium/psychosis/paranoia HENT: Normocephalic, Mucus membranes moist.  No thrush.  NG tube in place.  Mild sore throat. Neck: Supple, No tracheal deviation Chest: No chest wall pain w good  excursion CV:  Pulses intact.  Regular rhythm MS: Normal AROM mjr joints.  No obvious deformity  Abdomen: Soft.  Obese.  Nondistended.  Nontender.  No evidence of peritonitis.  No incarcerated hernias.  Dressing sites clean.  Ext:  No deformity.  No mjr edema.  No cyanosis Skin: No petechiae / purpura  Results:   Labs: Results for orders placed or performed during the hospital encounter of 11/07/18 (from the past 48 hour(s))  Hemoglobin and hematocrit, blood     Status: Abnormal   Collection Time: 11/10/18  2:24 PM  Result Value Ref Range   Hemoglobin 11.0 (L) 13.0 - 17.0 g/dL   HCT 36.1 (L) 39.0 - 52.0 %    Comment: Performed at [facility], [place] [address] [place], [place] [address]  Hemoglobin and hematocrit, blood     Status: Abnormal   Collection Time: 11/11/18  2:28 AM  Result Value Ref Range   Hemoglobin 9.5 (L) 13.0 - 17.0 g/dL   HCT 30.4 (L) 39.0 - 52.0 %    Comment: Performed at [facility], [place] [address] [place], [place] [address]  Phosphorus     Status: None   Collection Time: 11/11/18  2:28 AM  Result Value Ref Range   Phosphorus 3.0 2.5 - 4.6 mg/dL    Comment: Performed at [facility], [place] [address] [place], [place] [address]  Basic metabolic panel     Status: Abnormal   Collection Time: 11/11/18  2:28 AM  Result Value Ref Range   Sodium 138 135 - 145 mmol/L   Potassium 3.8 3.5 - 5.1 mmol/L   Chloride 109 98 - 111 mmol/L   CO2 22 22 - 32 mmol/L   Glucose, Bld 124 (H) 70 - 99 mg/dL   BUN 15 8 - 23 mg/dL   Creatinine, Ser 0.78 0.61 - 1.24 mg/dL   Calcium 8.0 (L) 8.9 - 10.3 mg/dL   GFR calc non Af Amer >60 >60 mL/min   GFR calc Af Amer >60 >60 mL/min   Anion gap 7 5 - 15    Comment: Performed at [facility], [place] [address], [place], [place] [address]  Hemoglobin and hematocrit, blood     Status: Abnormal   Collection Time: 11/11/18  3:17 PM  Result Value Ref Range    Hemoglobin 11.3 (L) 13.0 - 17.0 g/dL   HCT 36.7 (L) 39.0 - 52.0 %    Comment: Performed at [facility], [place] [address], [place], [place] [address]  Hemoglobin and hematocrit, blood     Status: Abnormal   Collection Time: 11/12/18  2:20 AM  Result Value Ref Range   Hemoglobin 9.9 (L) 13.0 - 17.0 g/dL   HCT 32.2 (L) 39.0 - 52.0 %    Comment: Performed at [facility], [place] [address], [place], [place] [address]  Phosphorus     Status: None   Collection Time: 11/12/18  2:20 AM  Result Value Ref Range   Phosphorus 3.1 2.5 - 4.6 mg/dL    Comment: Performed at [facility], [place] [address], [place], [place] [address]  Basic metabolic panel     Status: Abnormal   Collection Time:  11/12/18  2:20 AM  Result Value Ref Range   Sodium 137 135 - 145 mmol/L   Potassium 4.0 3.5 - 5.1 mmol/L   Chloride 109 98 - 111 mmol/L   CO2 22 22 - 32 mmol/L   Glucose, Bld 107 (H) 70 - 99 mg/dL   BUN 13 8 - 23 mg/dL   Creatinine, Ser 0.82 0.61 - 1.24 mg/dL   Calcium 8.1 (L) 8.9 - 10.3 mg/dL   GFR calc non Af Amer >60 >60 mL/min   GFR calc Af Amer >60 >60 mL/min   Anion gap 6 5 - 15    Comment: Performed at [facility], [place] [address], [place], [place] [address]    Imaging / Studies: Dg Esophagus W Single Cm (sol Or Thin Ba)  Result Date: 11/10/2018 CLINICAL DATA:  Post Nissen fundoplication surgery on 03/40/3524. EXAM: ESOPHOGRAM/BARIUM SWALLOW TECHNIQUE: Single contrast examination was performed using water-soluble contrast. FLUOROSCOPY TIME:  48 seconds (14.8 mGy) COMPARISON:  Chest CT - 11/07/2018 FINDINGS: Ingested contrast passes freely through the distal esophagus and GE junction without evidence of occlusion, stricture or contrast extravasation. IMPRESSION: No evidence of complication following Nissen fundoplication surgery. Specifically, no evidence of stricture or leak. Electronically Signed   By: Sandi Mariscal M.D.   On:  11/10/2018 11:14    Medications / Allergies: per chart  Antibiotics: Anti-infectives (From admission, onward)   Start     Dose/Rate Route Frequency Ordered Stop   11/07/18 2100  metroNIDAZOLE (FLAGYL) IVPB 500 mg     500 mg 100 mL/hr over 60 Minutes Intravenous Every 6 hours 11/07/18 1850 11/08/18 1017   11/07/18 1230  metroNIDAZOLE (FLAGYL) IVPB 500 mg     500 mg 100 mL/hr over 60 Minutes Intravenous On call to O.R. 11/07/18 1108 11/07/18 1458   11/07/18 1215  cefTRIAXone (ROCEPHIN) 2 g in sodium chloride 0.9 % 100 mL IVPB     2 g 200 mL/hr over 30 Minutes Intravenous On call to O.R. 11/07/18 1108 11/07/18 1513        Note: Portions of this report may have been transcribed using voice recognition software. Every effort was made to ensure accuracy; however, inadvertent computerized transcription errors may be present.   Any transcriptional errors that result from this process are unintentional.     Adin Hector, MD, FACS, MASCRS Gastrointestinal and Minimally Invasive Surgery    [address] [place], [place] [address] [phone] Main / Paging [phone] Fax

## 2018-11-12 NOTE — Progress Notes (Addendum)
Progress Note  Patient Name: Daniel Dalton. Date of Encounter: 11/12/2018  Primary Cardiologist: Truitt Merle, NP   Subjective   Doing well today. No complaints. Wanting to go home today.   Inpatient Medications    Scheduled Meds: . acetaminophen  1,000 mg Oral TID  . aspirin  81 mg Oral Daily  . chlorhexidine gluconate (MEDLINE KIT)  15 mL Mouth Rinse BID  . gabapentin  300 mg Oral QHS  . irbesartan  300 mg Oral Daily  . lip balm  1 application Topical BID  . mouth rinse  15 mL Mouth Rinse BID  . metoprolol tartrate  50 mg Oral BID  . pantoprazole  40 mg Intravenous Q12H  . sodium chloride flush  3 mL Intravenous Q12H  . sulfaSALAzine  2,000 mg Oral BID   Continuous Infusions: . sodium chloride    . diltiazem (CARDIZEM) infusion Stopped (11/11/18 1437)  . fentaNYL infusion INTRAVENOUS Stopped (11/08/18 0735)  . lactated ringers    . methocarbamol (ROBAXIN) IV    . ondansetron (ZOFRAN) IV     PRN Meds: sodium chloride, bisacodyl, diphenhydrAMINE, guaiFENesin-dextromethorphan, hydrALAZINE, hydrocortisone, hydrocortisone cream, labetalol, lactated ringers, magic mouthwash, methocarbamol (ROBAXIN) IV, methocarbamol, ondansetron (ZOFRAN) IV **OR** ondansetron (ZOFRAN) IV, ondansetron **OR** [DISCONTINUED] ondansetron (ZOFRAN) IV, phenol, prochlorperazine, sodium chloride flush, traMADol   Vital Signs    Vitals:   11/11/18 1623 11/11/18 2038 11/12/18 0203 11/12/18 0546  BP: 127/73 117/67 (!) 116/55 129/74  Pulse: 75 77 69 74  Resp: (!) 22 (!) 22 18 (!) 24  Temp: (!) 97.5 F (36.4 C) 98 F (36.7 C) 98.2 F (36.8 C) 98.6 F (37 C)  TempSrc: Axillary Oral Oral Oral  SpO2: 97% 96% 91% 93%  Weight:    88.3 kg  Height:        Intake/Output Summary (Last 24 hours) at 11/12/2018 0917 Last data filed at 11/12/2018 0818 Gross per 24 hour  Intake 1013 ml  Output 270 ml  Net 743 ml   Last 3 Weights 11/12/2018 11/11/2018 11/10/2018  Weight (lbs) 194 lb 9.6 oz 195 lb 1.7 oz  191 lb 11.2 oz  Weight (kg) 88.27 kg 88.5 kg 86.955 kg      Telemetry    NSR 87 bpm, brief run of SVT on tele - Personally Reviewed  ECG    Not performed today - Personally Reviewed  Physical Exam   GEN: No acute distress.   Neck: No JVD Cardiac: RRR, no murmurs, rubs, or gallops.  Respiratory: Clear to auscultation bilaterally. GI: Soft, nontender, non-distended  MS: No edema; No deformity. Neuro:  Nonfocal  Psych: Normal affect   Labs    Chemistry Recent Labs  Lab 11/06/18 2159  11/08/18 0300  11/10/18 0255 11/11/18 0228 11/12/18 0220  NA 140   < > 138   < > 138 138 137  K 4.3   < > 3.3*   < > 3.8 3.8 4.0  CL 105  --  110   < > 109 109 109  CO2 24  --  20*   < > 22 22 22   GLUCOSE 130*  --  121*   < > 128* 124* 107*  BUN 20  --  17   < > 13 15 13   CREATININE 0.82  --  0.78   < > 0.80 0.78 0.82  CALCIUM 9.6  --  8.2*   < > 8.3* 8.0* 8.1*  PROT 8.1  --  5.8*  --  6.6  --   --   ALBUMIN 4.7  --  3.2*  --  3.5  --   --   AST 19  --  77*  --  63*  --   --   ALT 14  --  89*  --  130*  --   --   ALKPHOS 76  --  47  --  52  --   --   BILITOT 0.9  --  0.6  --  1.1  --   --   GFRNONAA >60  --  >60   < > >60 >60 >60  GFRAA >60  --  >60   < > >60 >60 >60  ANIONGAP 11  --  8   < > 7 7 6    < > = values in this interval not displayed.     Hematology Recent Labs  Lab 11/07/18 1449  11/08/18 0300  11/10/18 0255  11/11/18 0228 11/11/18 1517 11/12/18 0220  WBC 10.0  --  7.3  --  6.9  --   --   --   --   RBC 3.60*  --  3.32*  --  3.89*  --   --   --   --   HGB 9.9*   < > 9.4*   < > 11.1*   < > 9.5* 11.3* 9.9*  HCT 31.8*   < > 29.8*   < > 35.6*   < > 30.4* 36.7* 32.2*  MCV 88.3  --  89.8  --  91.5  --   --   --   --   MCH 27.5  --  28.3  --  28.5  --   --   --   --   MCHC 31.1  --  31.5  --  31.2  --   --   --   --   RDW 14.2  --  14.5  --  14.6  --   --   --   --   PLT 240  --  217  --  225  --   --   --   --    < > = values in this interval not displayed.     Cardiac EnzymesNo results for input(s): TROPONINI in the last 168 hours. No results for input(s): TROPIPOC in the last 168 hours.   BNPNo results for input(s): BNP, PROBNP in the last 168 hours.   DDimer No results for input(s): DDIMER in the last 168 hours.   Radiology    Dg Esophagus W Single Cm (sol Or Thin Ba)  Result Date: 11/10/2018 CLINICAL DATA:  Post Nissen fundoplication surgery on 34/91/7915. EXAM: ESOPHOGRAM/BARIUM SWALLOW TECHNIQUE: Single contrast examination was performed using water-soluble contrast. FLUOROSCOPY TIME:  48 seconds (14.8 mGy) COMPARISON:  Chest CT - 11/07/2018 FINDINGS: Ingested contrast passes freely through the distal esophagus and GE junction without evidence of occlusion, stricture or contrast extravasation. IMPRESSION: No evidence of complication following Nissen fundoplication surgery. Specifically, no evidence of stricture or leak. Electronically Signed   By: Sandi Mariscal M.D.   On: 11/10/2018 11:14    Cardiac Studies   11/07/2018 Echo 1. The left ventricle appears to be normal in size, has moderate wall thickness 60-65% ejection fraction Spectral Doppler shows indeterminate pattern of diastolic filling. 2. The right ventricle has mildly enlarged size and normal systolic function. 3. Right ventricular systolic pressure is could not be assessed due to inability to visualize IVC. 4. Mildly dilated  right atrial size. 5. Mitral valve regurgitation is trivial by color flow Doppler. 6. Tricuspid regurgitation is mild. 7. There is mild sclerosis of the aortic valve without stenosis  Patient Profile     78 y.o. male a history ofPAFib, HTN, OSA, ulcerative colitisand admit with incarcerated hiatal hernia with mesenteroaxial gastric volvulus, gastric outlet obstruction and gastritis. Had MAT this admission, now in atrial fibrillation with RVR.  Assessment & Plan    1. AFib: likely related to inadvertent beta blocker interruption due to gastric  obstruction/surgery. His BB has been resumed and he is now back in NSR w/ HR in the upper 80s. IV Cardizem has been discontinued. Not on a/c. He has been resistant to chronic anticoagulation per records. Continue metoprolol 50 mg BID. We will arrange f/u in our office.   2. Cor pulmonale: echo c/w chronic R heart overload. Most likely due to delayed or inadequate treatment for OSA. He uses a jaw advancement device, not sure if sleep study was ever repeated with the device. He can f/u with Dr. Annamaria Boots who has been managing this.   3. Postoperative day 5 for incarcerated hernia/gastric volvulus/gastric outlet obstruction: progressing well. Management per general surgery.   4. SVT: brief run of SVT on tele. But asymptomatic. Continue metoprolol on d/c. We will arrange f/u in clinic. He may need an outpatient monitor. We can arrange this at time of clinic f/u if he is having any symptoms.    For questions or updates, please contact [place] Please consult [email or web address] for contact info under        Signed, Lyda Jester, PA-C  11/12/2018, 9:17 AM    Patient examined chart reviewed Doing well ready for d/c No murmurs clear lungs Telemetry with  Short bust SVT this am currently NSR Back on beta blocker Ok to d/c home will arrange outpatient Event monitor  Jenkins Rouge

## 2018-11-12 NOTE — Discharge Summary (Signed)
Physician Discharge Summary  Daniel Dalton. QQV:[id number] DOB: January 09, 1941 DOA: 11/07/2018  PCP: Prince Solian, MD  Admit date: 11/07/2018 Discharge date: 11/12/2018  Admitted From: Home Disposition:  Home  Recommendations for Outpatient Follow-up:  1. Follow up with PCP in 1-2 weeks  Discharge Condition:Improved CODE STATUS: No intubation Diet recommendation: Dysphagia 1   Brief/Interim Summary: 78 y.o.malewith PMH significant for A fib on aspirin, Ulcerative colitis on Sulfasalazine, hiatal hernia who presents with abdominal pain,pain isgeneralized that started after supperthenight prior to admission. He describe pain as severe, constant, is not sharp, 10/10. Pain is located mid abdomen. He relates pain is associated with nausea and vomiting. He has vomited twice, one time in the ED. He vomited coffee ground emesis in the ED.  He report pain medication has decrease intensity if pain.Patient currently denies chest pain, shortness of breath, cough, melena.  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 Surgery consulted and is following Pt now s/p surgery 1/29 Tolerating diet thus far per surgery. Recommendation for dysphagia 1, gradual advance diet as tolerated over next several months  2-Coffee-ground emesis;in the setting of gastric outlet obstruction. CBC reviewed. Hgb remains stable  3-Recent Sinus Tachycardia/MAT Agree with  continuation of IV metoprolol until pt tolerates PO reliably Tachycardic overnight, briefly requiring cardizem gtt Cardiology had been following Recent concerns for MAT. Pt has hx of OSA, tried CPAP, however did not tolerate  4-A. Fib with RVR; New episode of RVR this AM Continued on IV beta blocker PRN Resumed cardizem gtt, plan to wean down per Cardiology recs Plan to resume PO beta blocker when able to tolerate PO reliably Continue on tele Pt had declined anticoagulation in past Will continue on metoprolol 66m dosing on discharge  5-Ulcerative colitis; CT abdomen with negative finding for acute flare Remains stable at present  6-HTN;  BP stable on current regimen   Discharge Instructions  Discharge Instructions    Call MD for:   Complete by:  As directed    Temperature > 101.39F   Call MD for:  extreme fatigue   Complete by:  As directed    Call MD for:  hives   Complete by:  As directed    Call MD for:  persistant nausea and vomiting   Complete by:  As directed    Call MD for:  redness, tenderness, or signs of infection (pain, swelling, redness, odor or green/yellow discharge around incision site)   Complete by:  As directed    Call MD for:  severe uncontrolled pain   Complete by:  As directed    Diet general   Complete by:  As directed    SEE ESOPHAGEAL SURGERY DIET INSTRUCTIONS  We using usually start you out on a pureed (blenderized) diet. Expect some sticking with swallowing over the next 1-2 months.   This is due to swelling around your esophagus at the wrap & hiatal diaphragm repair.  It will gradually ease off over the next few months.   Discharge instructions   Complete by:  As directed    Please see discharge instruction sheets.   Also refer to any handouts/printouts that may have been given from the [facility] surgery office (if you visited [place] there before surgery) Please call our office if you have any questions or concerns [phone]   Driving  Restrictions   Complete by:  As directed    No driving until off narcotics and can safely swerve away without pain during an emergency   Increase activity slowly   Complete by:  As directed    Lifting restrictions   Complete by:  As directed    Avoid heavy lifting initially, <20 pounds at first.   Do not push through pain.   You have no specific weight limit: If it hurts to do, DON'T DO IT.    If you feel no pain, you are not injuring anything.  Pain will protect you from injury.   Coughing and sneezing are far more stressful to your incision than any lifting.   Avoid resuming heavy lifting (>50 pounds) or other intense activity until off all narcotic pain medications.   When want to exercise more, give yourself 2 weeks to gradually get back to full intense exercise/activity.   May shower / Bathe   Complete by:  As directed    [place].  It is fine for dressings or wounds to be washed/rinsed.  Use gentle soap & water.  This will help the incisions and/or wounds get clean & minimize infection.   May walk up steps   Complete by:  As directed    Remove dressing in 72 hours   Complete by:  As directed    You have closed incisions: Shower and bathe over these incisions with soap and water every day.  It is OK to wash over the dressings: they are waterproof. Remove all surgical dressings on postoperative day #3.  You do not need to replace dressings over the closed incisions unless you feel more comfortable with a Band-Aid covering it.   Please call our office [phone] if you have further questions.   Sexual Activity Restrictions   Complete by:  As directed    Sexual activity as tolerated.  Do not push through pain.  Pain will protect you from injury.   Walk with assistance   Complete by:  As directed    Walk over an hour a day.  May use a walker/cane/companion to help with balance and stamina.     Allergies as of 11/12/2018      Reactions   Fluorescein Rash      Medication  List    TAKE these medications   aspirin EC 81 MG tablet Take 1 tablet (81 mg total) by mouth daily.   metoprolol tartrate 50 MG tablet Commonly known as:  LOPRESSOR Take 1 tablet (50 mg total) by mouth 2 (two) times daily for 30 days. What changed:    medication strength  how much to take  additional instructions   ondansetron 4 MG tablet Commonly known as:  ZOFRAN Take 1 tablet (4 mg total) by mouth every 8 (eight) hours as needed for nausea.   promethazine 25 MG suppository Commonly known as:  PHENERGAN Place 1 suppository (25 mg total) rectally every 6 (six) hours as needed for refractory nausea / vomiting.   sulfaSALAzine 500 MG tablet Commonly known as:  AZULFIDINE Take 2,000 mg by mouth 2 (two) times daily.   telmisartan 80 MG tablet Commonly known as:  MICARDIS Take 80  mg by mouth daily.   traMADol 50 MG tablet Commonly known as:  ULTRAM Take 1-2 tablets (50-100 mg total) by mouth every 6 (six) hours as needed for moderate pain or severe pain.      Follow-up Information    Michael Boston, MD. Schedule an appointment as soon as possible for a visit in 3 weeks.   Specialty:  General Surgery Why:  To follow up after your operation, To follow up after your hospital stay Contact information: [address] [place] [address] [phone]          Allergies  Allergen Reactions  . Fluorescein Rash    Consultations:  General Surgery  Cardiology  Procedures/Studies: Ct Chest Wo Contrast  Result Date: 11/07/2018 CLINICAL DATA:  Hematemesis and possible EXAM: CT CHEST WITHOUT CONTRAST TECHNIQUE: Multidetector CT imaging of the chest was performed following the standard protocol without IV contrast. COMPARISON:  CT abdomen from 11/07/2018 and CT chest abdomen and pelvis from 12/24/2016 FINDINGS: Cardiovascular: Limited due to lack of IV contrast. Diffuse aortic calcifications are seen without aneurysmal dilatation. No cardiac enlargement is  noted. Coronary calcifications are seen. Mediastinum/Nodes: Thoracic inlet is within normal limits. No hilar or mediastinal adenopathy is identified. Lungs/Pleura: Lungs are well aerated with minimal left basilar atelectasis stable from the prior exam related to the patient's known hiatal hernia. No focal infiltrate or sizable parenchymal nodule is seen. Upper Abdomen: Large hiatal hernia is again identified with the majority of the stomach within the chest cavity. 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 Stomach remains distended with air and fluid. This may represent a degree of long-term outlet obstruction. Clinical correlation is recommended. Musculoskeletal: Postsurgical changes in the cervical spine are noted. Degenerative changes of the thoracic spine are seen. Old healed rib fractures are noted on the left. IMPRESSION: Stable appearing hiatal hernia dating back to 2018. The majority of the stomach lies within the chest cavity. Stomach remains distended with fluid which may be related to a partial gastric outlet obstruction. Organoaxial volvulus is noted as well also stable from the prior exam. Aortic Atherosclerosis (ICD10-I70.0). Electronically Signed   By: Inez Catalina M.D.   On: 11/07/2018 07:20   Ct Abdomen Pelvis W Contrast  Result Date: 11/07/2018 CLINICAL DATA:  78 y/o M; generalized abdominal pain, nausea, and vomiting. EXAM: CT ABDOMEN AND PELVIS WITH CONTRAST TECHNIQUE: Multidetector CT imaging of the abdomen and pelvis was performed using the standard protocol following bolus administration of intravenous contrast. CONTRAST:  136m ISOVUE-300 IOPAMIDOL (ISOVUE-300) INJECTION 61% COMPARISON:  05/11/2017 CT abdomen and pelvis. FINDINGS: Lower chest: No acute abnormality. Hepatobiliary: No focal liver abnormality is seen. No gallstones, gallbladder wall thickening, or biliary dilatation. Pancreas: Unremarkable. No pancreatic ductal dilatation or surrounding inflammatory changes.  Spleen: Normal in size without focal abnormality. Adrenals/Urinary Tract: Stable left adrenal nodule when measured in the coronal plane and 23 mm and on the prior study in similar fashion (series 4, image 63). Right kidney lower pole 5 mm nonobstructing stone. Multiple small stable renal cyst. No hydronephrosis or ureter stone. Normal bladder. Stomach/Bowel: Large hiatal hernia containing the majority of the stomach. Stomach is distended and fluid-filled with a portion of the gastric body below the diaphragm. The gastric antrum is at the gastroesophageal junction and the gastroesophageal junction is above the field of view. Appendix appears normal. No evidence of bowel wall thickening, distention, or inflammatory changes. Sigmoid diverticulosis without findings of acute diverticulitis. Vascular/Lymphatic: Aortic atherosclerosis. No enlarged abdominal or pelvic  lymph nodes. Reproductive: Prostate is unremarkable. Other: No abdominal wall hernia or abnormality. No abdominopelvic ascites. Musculoskeletal: No fracture is seen. Multilevel degenerative changes of the lumbar spine and mild-to-moderate bilateral hip osteoarthrosis. IMPRESSION: 1. Incompletely visualized large hiatal hernia containing the majority of the stomach which is distended. The gastric antrum is at the gastroesophageal junction and the gastroesophageal junction is above the field of view. Partial obstruction may be present either due to compression of the downward herniated gastric body and gastric volvulus is also possible. 2. Right kidney lower pole nonobstructing stone. 3. Sigmoid diverticulosis without findings of acute diverticulitis. Electronically Signed   By: Kristine Garbe M.D.   On: 11/07/2018 05:45   Dg Chest Port 1 View  Result Date: 11/08/2018 CLINICAL DATA:  Acute respiratory failure with hypoxia EXAM: PORTABLE CHEST 1 VIEW COMPARISON:  Yesterday FINDINGS: Endotracheal tube tip between the clavicular heads and carina. The  orogastric tube reaches the stomach. Drain overlapping the lower mediastinum. Low volume chest with streaky opacity best attributed atelectasis. No edema, effusion, or pneumothorax. Remote left rib fractures. IMPRESSION: Stable hardware positioning and low lung volumes with atelectasis. Electronically Signed   By: Monte Fantasia M.D.   On: 11/08/2018 06:46   Dg Chest Port 1 View  Result Date: 11/07/2018 CLINICAL DATA:  Status post intubation EXAM: PORTABLE CHEST 1 VIEW COMPARISON:  11/07/2018, 11/21/2013 FINDINGS: Postsurgical changes of the cervical spine. Endotracheal tube tip is about 14 mm superior to the carina. Esophageal tube tip appears positioned over the lower chest, likely within large hiatal hernia. Bibasilar atelectasis. Stable cardiomediastinal silhouette. No pneumothorax. IMPRESSION: 1. Endotracheal tube tip about 14 mm superior to carina 2. Esophageal tube tip projects over the lower chest likely within large hiatal hernia 3. Bibasilar atelectasis Electronically Signed   By: Donavan Foil M.D.   On: 11/07/2018 19:14   Dg Chest Portable 1 View  Result Date: 11/07/2018 CLINICAL DATA:  Large hiatal hernia. History of atrial fibrillation. Abdominal pain. EXAM: PORTABLE CHEST 1 VIEW COMPARISON:  CT 11/07/2018. FINDINGS: Mediastinum and hilar structures normal. Large hiatal hernia. NG tube tip noted coiled in the hiatal hernia. Cardiomegaly with normal pulmonary vascularity. Mild basilar atelectasis. No pleural effusion or pneumothorax. No acute bony abnormality. Prior cervical spine fusion. IMPRESSION: 1. Large hiatal hernia. NG tube noted with its tip coiled in the hiatal hernia. 2. Cardiomegaly. No pulmonary venous congestion. Mild bibasilar atelectasis. Electronically Signed   By: Marcello Moores  Register   On: 11/07/2018 09:57   Dg Esophagus W Single Cm (sol Or Thin Ba)  Result Date: 11/10/2018 CLINICAL DATA:  Post Nissen fundoplication surgery on 88/50/2774. EXAM: ESOPHOGRAM/BARIUM SWALLOW  TECHNIQUE: Single contrast examination was performed using water-soluble contrast. FLUOROSCOPY TIME:  48 seconds (14.8 mGy) COMPARISON:  Chest CT - 11/07/2018 FINDINGS: Ingested contrast passes freely through the distal esophagus and GE junction without evidence of occlusion, stricture or contrast extravasation. IMPRESSION: No evidence of complication following Nissen fundoplication surgery. Specifically, no evidence of stricture or leak. Electronically Signed   By: Sandi Mariscal M.D.   On: 11/10/2018 11:14    Subjective: Eager to go home  Discharge Exam: Vitals:   11/12/18 1037 11/12/18 1038  BP: (!) 142/80 (!) 142/80  Pulse: 94 94  Resp:    Temp:    SpO2:     Vitals:   11/12/18 0203 11/12/18 0546 11/12/18 1037 11/12/18 1038  BP: (!) 116/55 129/74 (!) 142/80 (!) 142/80  Pulse: 69 74 94 94  Resp: 18 (!) 24  Temp: 98.2 F (36.8 C) 98.6 F (37 C)    TempSrc: Oral Oral    SpO2: 91% 93%    Weight:  88.3 kg    Height:        General: Pt is alert, awake, not in acute distress Cardiovascular: RRR, S1/S2 +, no rubs, no gallops Respiratory: CTA bilaterally, no wheezing, no rhonchi Abdominal: Soft, NT, ND, bowel sounds + Extremities: no edema, no cyanosis   The results of significant diagnostics from this hospitalization (including imaging, microbiology, ancillary and laboratory) are listed below for reference.     Microbiology: No results found for this or any previous visit (from the past 240 hour(s)).   Labs: BNP (last 3 results) No results for input(s): BNP in the last 8760 hours. Basic Metabolic Panel: Recent Labs  Lab 11/08/18 0300 11/09/18 0159 11/10/18 0255 11/11/18 0228 11/12/18 0220  NA 138 139 138 138 137  K 3.3* 3.8 3.8 3.8 4.0  CL 110 110 109 109 109  CO2 20* 22 22 22 22   GLUCOSE 121* 120* 128* 124* 107*  BUN 17 14 13 15 13   CREATININE 0.78 0.75 0.80 0.78 0.82  CALCIUM 8.2* 8.0* 8.3* 8.0* 8.1*  MG 1.5* 1.8 2.0  --   --   PHOS <1.0* 2.0* 2.2* 3.0 3.1    Liver Function Tests: Recent Labs  Lab 11/06/18 2159 11/08/18 0300 11/10/18 0255  AST 19 77* 63*  ALT 14 89* 130*  ALKPHOS 76 47 52  BILITOT 0.9 0.6 1.1  PROT 8.1 5.8* 6.6  ALBUMIN 4.7 3.2* 3.5   Recent Labs  Lab 11/06/18 2159  LIPASE 37   No results for input(s): AMMONIA in the last 168 hours. CBC: Recent Labs  Lab 11/06/18 2159 11/07/18 1449  11/08/18 0300  11/10/18 0255 11/10/18 1424 11/11/18 0228 11/11/18 1517 11/12/18 0220  WBC 8.5 10.0  --  7.3  --  6.9  --   --   --   --   HGB 12.5* 9.9*   < > 9.4*   < > 11.1* 11.0* 9.5* 11.3* 9.9*  HCT 38.9* 31.8*   < > 29.8*   < > 35.6* 36.1* 30.4* 36.7* 32.2*  MCV 87.6 88.3  --  89.8  --  91.5  --   --   --   --   PLT 296 240  --  217  --  225  --   --   --   --    < > = values in this interval not displayed.   Cardiac Enzymes: No results for input(s): CKTOTAL, CKMB, CKMBINDEX, TROPONINI in the last 168 hours. BNP: Invalid input(s): POCBNP CBG: No results for input(s): GLUCAP in the last 168 hours. D-Dimer No results for input(s): DDIMER in the last 72 hours. Hgb A1c No results for input(s): HGBA1C in the last 72 hours. Lipid Profile No results for input(s): CHOL, HDL, LDLCALC, TRIG, CHOLHDL, LDLDIRECT in the last 72 hours. Thyroid function studies No results for input(s): TSH, T4TOTAL, T3FREE, THYROIDAB in the last 72 hours.  Invalid input(s): FREET3 Anemia work up No results for input(s): VITAMINB12, FOLATE, FERRITIN, TIBC, IRON, RETICCTPCT in the last 72 hours. Urinalysis    Component Value Date/Time   COLORURINE AMBER (A) 11/06/2018 2307   APPEARANCEUR CLEAR 11/06/2018 2307   LABSPEC 1.024 11/06/2018 2307   PHURINE 5.0 11/06/2018 [place] 11/06/2018 2307   HGBUR NEGATIVE 11/06/2018 2307   BILIRUBINUR NEGATIVE 11/06/2018 2307   KETONESUR 20 (A) 11/06/2018  2307   PROTEINUR 100 (A) 11/06/2018 2307   NITRITE NEGATIVE 11/06/2018 2307   LEUKOCYTESUR NEGATIVE 11/06/2018 2307   Sepsis  Labs Invalid input(s): PROCALCITONIN,  WBC,  LACTICIDVEN Microbiology No results found for this or any previous visit (from the past 240 hour(s)).  Time spent: 30 min  SIGNED:   Marylu Lund, MD  [facility] 11/12/2018, 11:28 AM  If 7PM-7AM, please contact night-coverage

## 2018-12-10 ENCOUNTER — Encounter: Payer: Self-pay | Admitting: Nurse Practitioner

## 2018-12-10 ENCOUNTER — Ambulatory Visit: Payer: Medicare Other | Admitting: Nurse Practitioner

## 2018-12-10 VITALS — BP 134/72 | HR 61 | Ht 70.0 in | Wt 188.8 lb

## 2018-12-10 DIAGNOSIS — I480 Paroxysmal atrial fibrillation: Secondary | ICD-10-CM

## 2018-12-10 NOTE — Progress Notes (Signed)
CARDIOLOGY OFFICE NOTE  Date:  12/10/2018    Daniel Dalton. Date of Birth: 08-Apr-1941 Medical Record #[id number]  PCP:  Daniel Solian, MD  Cardiologist:  Daniel Dalton   Chief Complaint  Patient presents with  . Follow-up    Hospital follow up.     History of Present Illness: Daniel Dalton. is a 78 y.o. male who presents today for a post hospital visit. Former patient of Dr. Claris Dalton - now following with me.   He has a history of a AF - on aspirin, UC on Sulfasalaine and hiatal hernia. He has OSA - has not wanted to do CPAP.   Presented late last month with abdominal pain - had coffee ground emesis. Gastric outlet obstruction noted and surgery was performed for his large hiatal hernia/volvulus - he had robotic reduction/repair/partial fundoplication on 01/16/8118. Stay was complicated by sinus tach/MAT/AF with RVR - required IV metoprolol until able to take po's. He has declined anticoagulation in the past. Discharged on 50 mg of Metoprolol.   He was last seen in this office in November of 2019 by Daniel Dalton - was doing well at that time.  Had continued to decline anticoagulation despite elevated CHADSVASC score of 3.   Comes in today. Here alone. He has done well since his admission in late January. Feels like he is back to his baseline. Back on his regular regimen of medicines. No palpitations. No chest pain. He is back on aspirin. He has seen his PCP - says he has had labs checked. He was anemic and had elevated LFTs noted. He has seen Dr. Johney Dalton as well and had a good report and was released. He has no real concerns today. He does use a dental appliance made by Daniel Dalton (orthodontics) in place of CPAP - he tried CPAP three times without success.   Past Medical History:  Diagnosis Date  . Arrhythmia   . Arthritis   . Colitis   . Family history of anesthesia complication    mother post- op NV  . Heart murmur   . History of kidney stones   . Hypertension   .  Paroxysmal atrial fibrillation (HCC)    history of  . Prostate cancer ([place])    in remission  . Sleep apnea    uses CPAP  . Umbilical hernia     Past Surgical History:  Procedure Laterality Date  . ANTERIOR CERVICAL DECOMP/DISCECTOMY FUSION N/A 11/21/2013   Procedure: Cervical four-five, Cervical five-six, Cervical six-seven anterior cervical decompression with fusion plating and bonegraft;  Surgeon: Daniel Spangle, MD;  Location: [facility];  Service: Neurosurgery;  Laterality: N/A;  Cervical four-five, Cervical five-six, Cervical six-seven anterior cervical decompression with fusion plating and bonegraft  . BACK SURGERY    . LEG SURGERY Right   . NASAL SINUS SURGERY    . PROSTATECTOMY       Medications: Current Meds  Medication Sig  . aspirin EC 81 MG tablet Take 1 tablet (81 mg total) by mouth daily.  . metoprolol tartrate (LOPRESSOR) 50 MG tablet Take 1 tablet (50 mg total) by mouth 2 (two) times daily for 30 days.  Daniel Dalton Kitchen sulfaSALAzine (AZULFIDINE) 500 MG tablet Take 2,000 mg by mouth 2 (two) times daily.   Daniel Dalton Kitchen telmisartan (MICARDIS) 80 MG tablet Take 80 mg by mouth daily.   . traMADol (ULTRAM) 50 MG tablet Take 1-2 tablets (50-100 mg total) by mouth every 6 (six) hours as needed for  moderate pain or severe pain.     Allergies: Allergies  Allergen Reactions  . Fluorescein Rash    Social History: The patient  reports that he quit smoking about 49 years ago. His smoking use included cigarettes. He has a 7.50 pack-year smoking history. He has quit using smokeless tobacco.  His smokeless tobacco use included chew. He reports that he does not drink alcohol or use drugs.   Family History: The patient's family history includes Anesthesia problems in his mother; Dementia (age of onset: 58) in his mother; Hypertension (age of onset: 79) in his sister; Hypertension (age of onset: 48) in his brother; Hypothyroidism in his sister; Parkinsonism (age of onset: 50) in his father.    Review of Systems: Please see the history of present illness.   Otherwise, the review of systems is positive for none.   All other systems are reviewed and negative.   Physical Exam: VS:  BP 134/72 (BP Location: Left Arm, Patient Position: Sitting, Cuff Size: Normal)   Pulse 61   Ht 5\' 10"  (1.778 m)   Wt 188 lb 12.8 oz (85.6 kg)   BMI 27.09 kg/m  .  BMI Body mass index is 27.09 kg/m.  Wt Readings from Last 3 Encounters:  12/10/18 188 lb 12.8 oz (85.6 kg)  11/12/18 194 lb 9.6 oz (88.3 kg)  08/21/18 193 lb (87.5 kg)    General: Pleasant. Alert. He looks pale. He is in no acute distress.   HEENT: Normal.  Neck: Supple, no JVD, carotid bruits, or masses noted.  Cardiac: Regular rate and rhythm. No murmurs, rubs, or gallops. No edema.  Respiratory:  Lungs are clear to auscultation bilaterally with normal work of breathing.  GI: Soft and nontender.  MS: No deformity or atrophy. Gait and ROM intact.  Skin: Warm and dry. Color is normal.  Neuro:  Strength and sensation are intact and no gross focal deficits noted.  Psych: Alert, appropriate and with normal affect.   LABORATORY DATA:  EKG:  EKG is ordered today. This demonstrates NSR.  Lab Results  Component Value Date   WBC 6.9 11/10/2018   HGB 9.9 (L) 11/12/2018   HCT 32.2 (L) 11/12/2018   PLT 225 11/10/2018   GLUCOSE 107 (H) 11/12/2018   TRIG 21 11/07/2018   ALT 130 (H) 11/10/2018   AST 63 (H) 11/10/2018   NA 137 11/12/2018   K 4.0 11/12/2018   CL 109 11/12/2018   CREATININE 0.82 11/12/2018   BUN 13 11/12/2018   CO2 22 11/12/2018   INR 0.97 11/07/2018       BNP (last 3 results) No results for input(s): BNP in the last 8760 hours.  ProBNP (last 3 results) No results for input(s): PROBNP in the last 8760 hours.   Other Studies Reviewed Today:  11/07/2018 Echo 1. The left ventricle appears to be normal in size, has moderate wall thickness 60-65% ejection fraction Spectral Doppler shows indeterminate  pattern of diastolic filling. 2. The right ventricle has mildly enlarged size and normal systolic function. 3. Right ventricular systolic pressure is could not be assessed due to inability to visualize IVC. 4. Mildly dilated right atrial size. 5. Mitral valve regurgitation is trivial by color flow Doppler. 6. Tricuspid regurgitation is mild. 7. There is mild sclerosis of the aortic valve without stenosis   ASSESSMENT AND PLAN:  1. Recent episode of AF - felt to be multifactorial with inadvertent beta blocker interruption due to gastric obstruction/sugery - he is back in NSR. Back  on his regular regimen. He has declined anticoagulation - declines again today. He is back on low dose aspirin. I do not see a need for event monitor at this time. He is totally asymptomatic and back in NSR with his routine regimen. CHADSVASC remains 3.   2. Cor pulmonale - most recent echo noted.   3. OSA - has not been able to use CPAP - he uses a dental appliance instead. He does not follow up with Dr. Annamaria Boots anymore.   4. Post op surgery for incarcerated hernia/gastric volvulus/gastric outlet obstruction - looks to be recovering well.   5. HTN - BP is fine here today. No changes made.   Current medicines are reviewed with the patient today.  The patient does not have concerns regarding medicines other than what has been noted above.  The following changes have been made:  See above.  Labs/ tests ordered today include:    Orders Placed This Encounter  Procedures  . EKG 12-Lead     Disposition:   FU with me as planned later this year.   Patient is agreeable to this plan and will call if any problems develop in the interim.   SignedTruitt Merle, NP  12/10/2018 9:15 AM  [facility] [address] [place], [place]  [address] Phone: [phone] Fax: [phone]

## 2018-12-10 NOTE — Patient Instructions (Addendum)
We will be checking the following labs today - NONE    Medication Instructions:    Continue with your current medicines.    If you need a refill on your cardiac medications before your next appointment, please call your pharmacy.     Testing/Procedures To Be Arranged:  N/A  Follow-Up:   See me back as planned in November    At [facility], you and your health needs are our priority.  As part of our continuing mission to provide you with exceptional heart care, we have created designated Provider Care Teams.  These Care Teams include your primary Cardiologist (physician) and Advanced Practice Providers (APPs -  Physician Assistants and Nurse Practitioners) who all work together to provide you with the care you need, when you need it.  Special Instructions:  . None  Call the [place] office at [phone] if you have any questions, problems or concerns.

## 2018-12-21 ENCOUNTER — Telehealth: Payer: Self-pay | Admitting: Nurse Practitioner

## 2018-12-21 MED ORDER — METOPROLOL TARTRATE 25 MG PO TABS: 25.0000 mg | ORAL_TABLET | Freq: Two times a day (BID) | ORAL | 3 refills | Status: DC

## 2018-12-21 NOTE — Telephone Encounter (Signed)
  Pt c/o medication issue:  1. Name of Medication: metoprolol tartrate (LOPRESSOR) 50 MG tablet  2. How are you currently taking this medication (dosage and times per day)? Take 1 tablet (50 mg total) by mouth 2 (two) times daily for 30 days  3. Are you having a reaction (difficulty breathing--STAT)?  No  4. What is your medication issue? Patient trialed the 25mg  and the 50mg  He needs med refilled but not sure which dosage Truitt Merle would want him to take. He states that he feels the 25mg  is working well for him.

## 2018-12-21 NOTE — Telephone Encounter (Signed)
Pt verbalized understanding to keep taking the Metoprolol as he has been taking it 25mg  bid and per Truitt Merle NP to keep track of his BP and HR and to let us know if he develops any problems.

## 2018-12-21 NOTE — Telephone Encounter (Signed)
Pt called to report that he as weaned himself down to metoprolol 25mg  bid for about three weeks and he is feeling well...  He reports that it was his original dose prior to a hospital visit end of January... while he was in the hospital it was increased to the 50mg  bid since he had a fast heart rate while he was there...   He says he feels well on the 25 bid but I advised him that I will need to forward to Truitt Merle NP for her review since she had noted that he had some AF at his last OV 12/10/18 and was also doing well on the increased dose.   Pt verbalized understanding and I advised him that I will call him back after she has a chance to review his message re: this dose change.

## 2018-12-21 NOTE — Telephone Encounter (Signed)
Noted. Ok to continue on his current regimen. Keep check on BP and HR.   Cecille Rubin

## 2019-08-15 NOTE — Progress Notes (Signed)
CARDIOLOGY OFFICE NOTE  Date:  08/20/2019    Daniel Dalton. Date of Birth: March 16, 1941 Medical Record [id number]  PCP:  Prince Solian, MD  Cardiologist:  Servando Snare    Chief Complaint  Patient presents with  . Follow-up    History of Present Illness: Daniel H Stavon Bazan. is a 78 y.o. male who presents today for an 8 month check. Former patient of Dr. Claris Gladden - now following with me.   He has a history of PAF - on aspirin, UC on Sulfasalaine and hiatal hernia. He has OSA - has not wanted to do CPAP.   Presented back in January 2020 with abdominal pain - had coffee ground emesis. Gastric outlet obstruction noted and surgery was performed for his large hiatal hernia/volvulus - he had robotic reduction/repair/partial fundoplication on [id number]. Stay was complicated by sinus tach/MAT/AF with RVR - required IV metoprolol until able to take po's. He has declined anticoagulation in the past. Discharged on 50 mg of Metoprolol.   I then saw him in March - he was doing well and felt like he was back at his baseline. Had been released from general surgery. Uses a dental appliance in place of CPAP - he has tried CPAP three times without success.   The patient does not have symptoms concerning for COVID-19 infection (fever, chills, cough, or new shortness of breath).   Comes in today. Here alone. He has turned 78. He is doing well. He is walking his mile every day. He has been hiking more with the pandemic. No chest pain. Breathing is good. No palpitations. No bleeding. Abdominal issues are resolved. Remains a little anemic by his labs from PCP. He feels like he is doing well.   Past Medical History:  Diagnosis Date  . Arrhythmia   . Arthritis   . Colitis   . Family history of anesthesia complication    mother post- op NV  . Heart murmur   . History of kidney stones   . Hypertension   . Paroxysmal atrial fibrillation (HCC)    history of  . Prostate cancer ([place])    in  remission  . Sleep apnea    uses CPAP  . Umbilical hernia     Past Surgical History:  Procedure Laterality Date  . ANTERIOR CERVICAL DECOMP/DISCECTOMY FUSION N/A 11/21/2013   Procedure: Cervical four-five, Cervical five-six, Cervical six-seven anterior cervical decompression with fusion plating and bonegraft;  Surgeon: Hosie Spangle, MD;  Location: [facility] [facility];  Service: Neurosurgery;  Laterality: N/A;  Cervical four-five, Cervical five-six, Cervical six-seven anterior cervical decompression with fusion plating and bonegraft  . BACK SURGERY    . LEG SURGERY Right   . NASAL SINUS SURGERY    . PROSTATECTOMY       Medications: Current Meds  Medication Sig  . aspirin EC 81 MG tablet Take 1 tablet (81 mg total) by mouth daily.  . metoprolol tartrate (LOPRESSOR) 25 MG tablet Take 1 tablet (25 mg total) by mouth 2 (two) times daily.  Marland Kitchen sulfaSALAzine (AZULFIDINE) 500 MG tablet Take 2,000 mg by mouth 2 (two) times daily.   Marland Kitchen telmisartan (MICARDIS) 80 MG tablet Take 80 mg by mouth daily.   . traMADol (ULTRAM) 50 MG tablet Take 1-2 tablets (50-100 mg total) by mouth every 6 (six) hours as needed for moderate pain or severe pain.     Allergies: Allergies  Allergen Reactions  . Fluorescein Rash    Social History: The patient  reports that he quit smoking about 49 years ago. His smoking use included cigarettes. He has a 7.50 pack-year smoking history. He has quit using smokeless tobacco.  His smokeless tobacco use included chew. He reports that he does not drink alcohol or use drugs.   Family History: The patient's family history includes Anesthesia problems in his mother; Dementia (age of onset: 69) in his mother; Hypertension (age of onset: 63) in his sister; Hypertension (age of onset: 32) in his brother; Hypothyroidism in his sister; Parkinsonism (age of onset: 65) in his father.   Review of Systems: Please see the history of present illness.   All other systems are reviewed and  negative.   Physical Exam: VS:  BP 128/64   Pulse (!) 58   Ht 5\' 10"  (1.778 m)   Wt 189 lb 12.8 oz (86.1 kg)   SpO2 99%   BMI 27.23 kg/m  .  BMI Body mass index is 27.23 kg/m.  Wt Readings from Last 3 Encounters:  08/20/19 189 lb 12.8 oz (86.1 kg)  12/10/18 188 lb 12.8 oz (85.6 kg)  11/12/18 194 lb 9.6 oz (88.3 kg)    General: Alert and in no acute distress.   HEENT: Normal.  Neck: Supple, no JVD, carotid bruits, or masses noted.  Cardiac: Regular rate and rhythm. No murmurs, rubs, or gallops. No edema.  Respiratory:  Lungs are clear to auscultation bilaterally with normal work of breathing.  GI: Soft and nontender.  MS: No deformity or atrophy. Gait and ROM intact.  Skin: Warm and dry. Color is normal.  Neuro:  Strength and sensation are intact and no gross focal deficits noted.  Psych: Alert, appropriate and with normal affect.   LABORATORY DATA:  EKG:  EKG is not ordered today.   Lab Results  Component Value Date   WBC 6.9 11/10/2018   HGB 9.9 (L) 11/12/2018   HCT 32.2 (L) 11/12/2018   PLT 225 11/10/2018   GLUCOSE 107 (H) 11/12/2018   TRIG 21 11/07/2018   ALT 130 (H) 11/10/2018   AST 63 (H) 11/10/2018   NA 137 11/12/2018   K 4.0 11/12/2018   CL 109 11/12/2018   CREATININE 0.82 11/12/2018   BUN 13 11/12/2018   CO2 22 11/12/2018   INR 0.97 11/07/2018       BNP (last 3 results) No results for input(s): BNP in the last 8760 hours.  ProBNP (last 3 results) No results for input(s): PROBNP in the last 8760 hours.   Other Studies Reviewed Today:  11/07/2018 Echo 1. The left ventricle appears to be normal in size, has moderate wall thickness 60-65% ejection fraction Spectral Doppler shows indeterminate pattern of diastolic filling. 2. The right ventricle has mildly enlarged size and normal systolic function. 3. Right ventricular systolic pressure is could not be assessed due to inability to visualize IVC. 4. Mildly dilated right atrial size. 5.  Mitral valve regurgitation is trivial by color flow Doppler. 6. Tricuspid regurgitation is mild. 7. There is mild sclerosis of the aortic valve without stenosis   ASSESSMENT AND PLAN:   1. PAF - last episode back in January of 2020 in the setting of beta blocker interruption due to gastric obstruction/surgery - has declined anticoagulation on several occasions despite CHADSVASC of 3. Remains in NSR.    2. Cor pulmonale - breathing is stable - last echo from earlier this year is stable. He is doing quite well.   3. OSA - unable to use CPAP - uses dental  appliance instead  4. HTN - BP is good - no changes made today.   5. Health maintenance - refused flu shot. He does a very nice job with [employer]. Encouraged him to stay active.   6. COVID-19 Education: The signs and symptoms of COVID-19 were discussed with the patient and how to seek care for testing (follow up with PCP or arrange E-visit).  The importance of social distancing, staying at home, hand hygiene and wearing a mask when out in public were discussed today.  Current medicines are reviewed with the patient today.  The patient does not have concerns regarding medicines other than what has been noted above.  The following changes have been made:  See above.  Labs/ tests ordered today include:    No orders of the defined types were placed in this encounter.    Disposition:   FU with me in 9 months.   Patient is agreeable to this plan and will call if any problems develop in the interim.   SignedTruitt Merle, NP  08/20/2019 8:09 AM  [facility] [address] [place], [place]  [address] Phone: [phone] Fax: [phone]

## 2019-08-20 ENCOUNTER — Other Ambulatory Visit: Payer: Self-pay

## 2019-08-20 ENCOUNTER — Ambulatory Visit: Payer: Medicare Other | Admitting: Nurse Practitioner

## 2019-08-20 ENCOUNTER — Encounter (INDEPENDENT_AMBULATORY_CARE_PROVIDER_SITE_OTHER): Payer: Self-pay

## 2019-08-20 ENCOUNTER — Encounter: Payer: Self-pay | Admitting: Nurse Practitioner

## 2019-08-20 VITALS — BP 128/64 | HR 58 | Ht 70.0 in | Wt 189.8 lb

## 2019-08-20 DIAGNOSIS — I480 Paroxysmal atrial fibrillation: Secondary | ICD-10-CM | POA: Diagnosis not present

## 2019-08-20 DIAGNOSIS — G4733 Obstructive sleep apnea (adult) (pediatric): Secondary | ICD-10-CM

## 2019-08-20 DIAGNOSIS — Z7189 Other specified counseling: Secondary | ICD-10-CM | POA: Diagnosis not present

## 2019-08-20 DIAGNOSIS — I159 Secondary hypertension, unspecified: Secondary | ICD-10-CM | POA: Diagnosis not present

## 2019-08-20 NOTE — Patient Instructions (Addendum)
After Visit Summary:  We will be checking the following labs today - NONE   Medication Instructions:    Continue with your current medicines.    If you need a refill on your cardiac medications before your next appointment, please call your pharmacy.     Testing/Procedures To Be Arranged:  N/A  Follow-Up:   See me in 9 months    At [facility], you and your health needs are our priority.  As part of our continuing mission to provide you with exceptional heart care, we have created designated Provider Care Teams.  These Care Teams include your primary Cardiologist (physician) and Advanced Practice Providers (APPs -  Physician Assistants and Nurse Practitioners) who all work together to provide you with the care you need, when you need it.  Special Instructions:  . Stay safe, stay home, wash your hands for at least 20 seconds and wear a mask when out in public.  . It was good to talk with you today.  Marland Kitchen Keep up the good work!   Call the [place] office at [phone] if you have any questions, problems or concerns.

## 2019-10-21 ENCOUNTER — Other Ambulatory Visit: Payer: Self-pay | Admitting: Nurse Practitioner

## 2019-11-25 IMAGING — CT CT ABD-PELV W/ CM
2 of 5 series · 15 of 46 positions shown, 17 images · IV contrast (ISOVUE)
Comparison: 05/11/2017 CT abdomen and pelvis.

CLINICAL DATA: 77 y/o M; generalized abdominal pain, nausea, and
vomiting.

EXAM:
CT ABDOMEN AND PELVIS WITH CONTRAST
TECHNIQUE: Multidetector CT imaging of the abdomen and pelvis was performed
using the standard protocol following bolus administration of
intravenous contrast.
CONTRAST:  100mL QLNJUS-YSS IOPAMIDOL (QLNJUS-YSS) INJECTION 61%

[Series 2: axial st · axial · 0.74mm/px · z∈[+1246,+1626]mm · 12 of 86 slices shown, 14 images]
[im 5/86  soft-tissue]
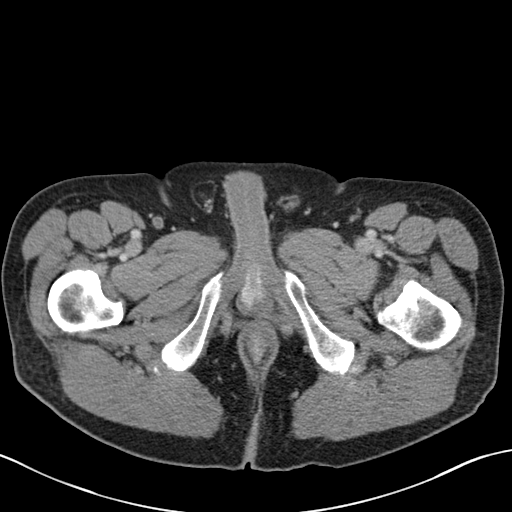
[im 5/86  bone]
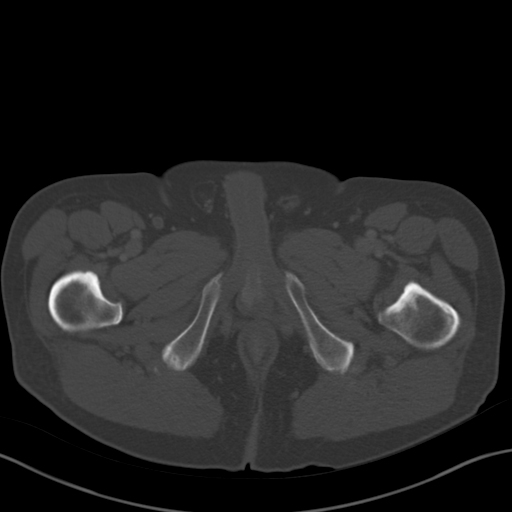
[im 14/86  soft-tissue]
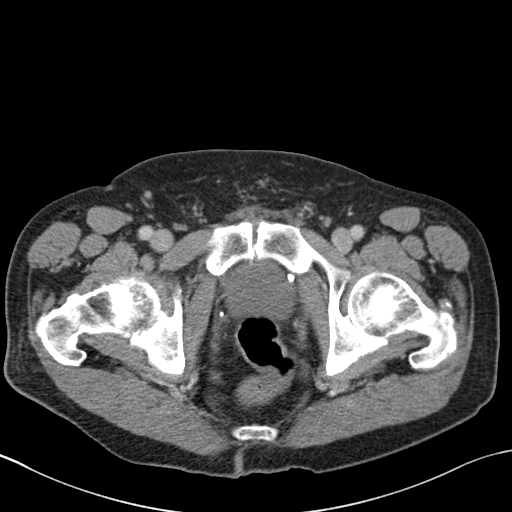
[im 18/86  soft-tissue]
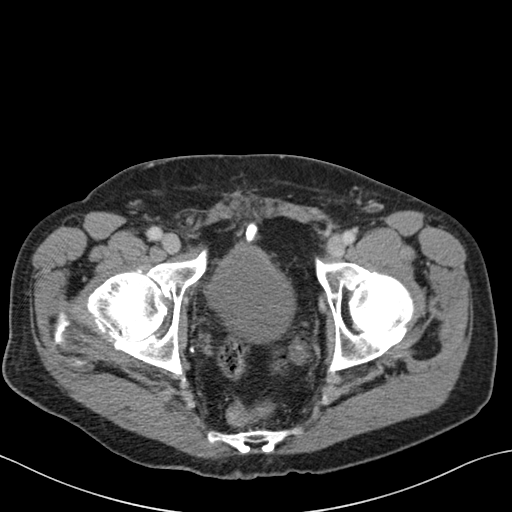
[im 27/86  soft-tissue]
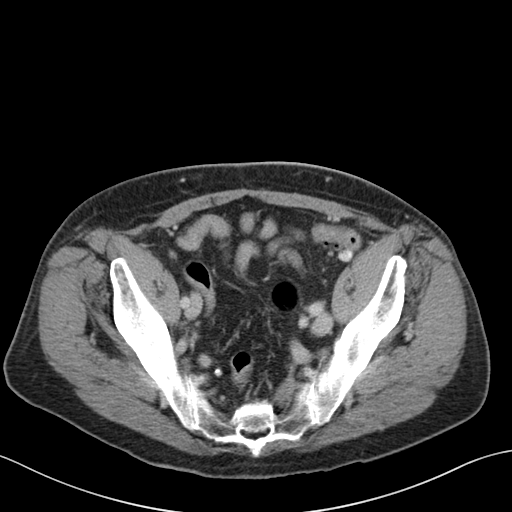
[im 32/86  soft-tissue]
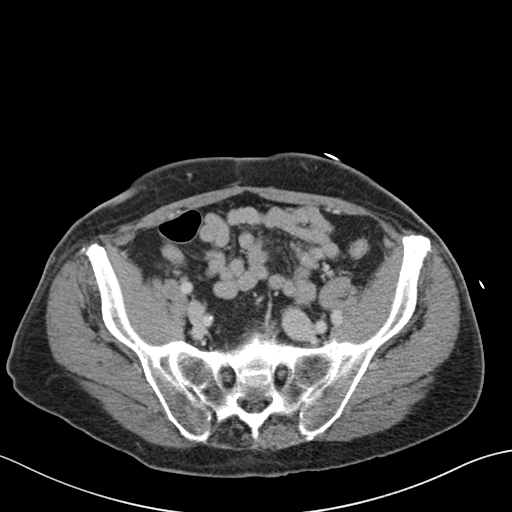
[im 41/86  soft-tissue]
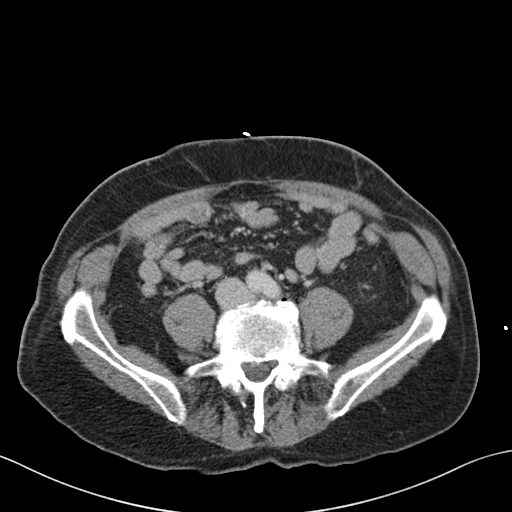
[im 45/86  soft-tissue]
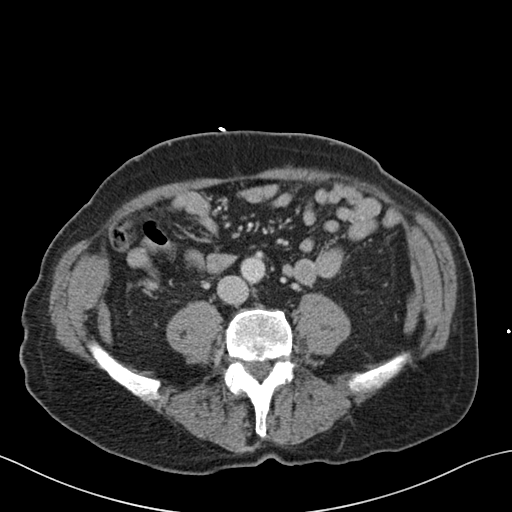
[im 54/86  soft-tissue]
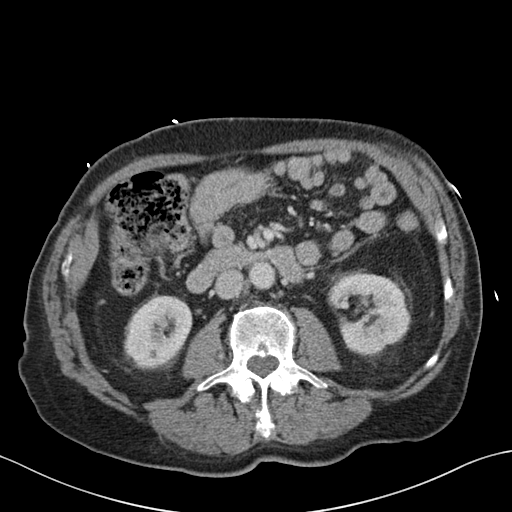
[im 59/86  soft-tissue]
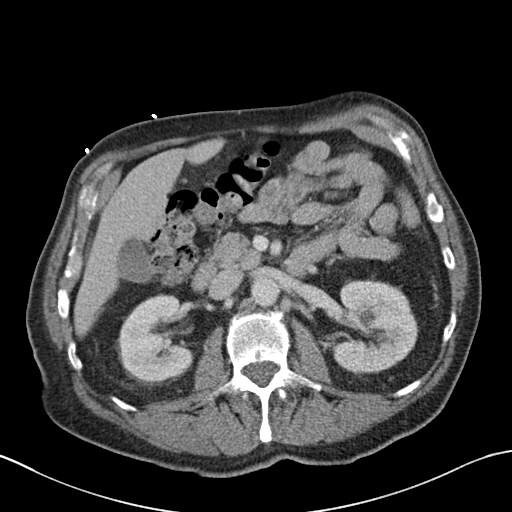
[im 59/86  bone]
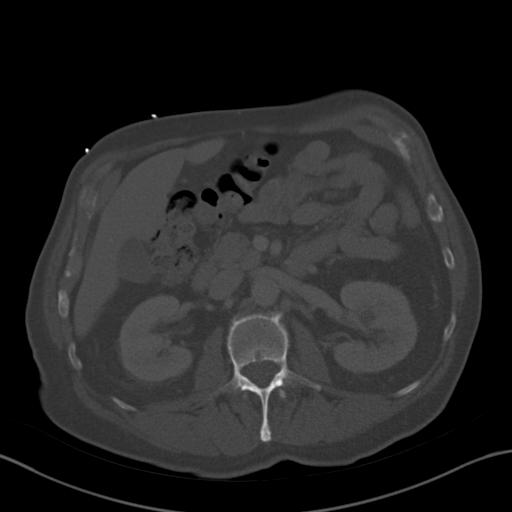
[im 68/86  soft-tissue]
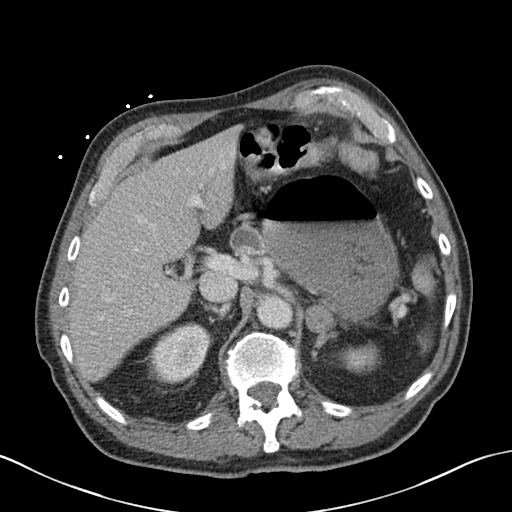
[im 72/86  soft-tissue]
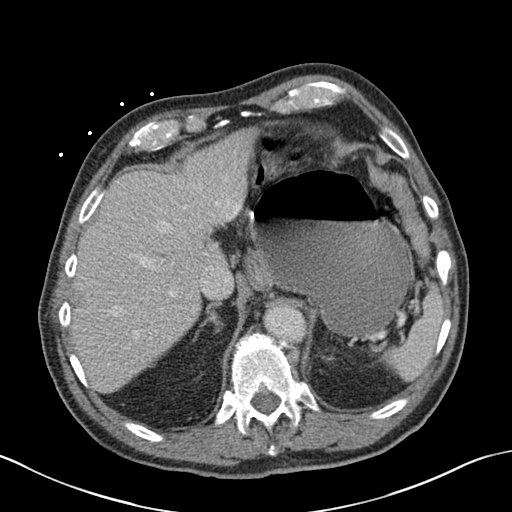
[im 81/86  soft-tissue]
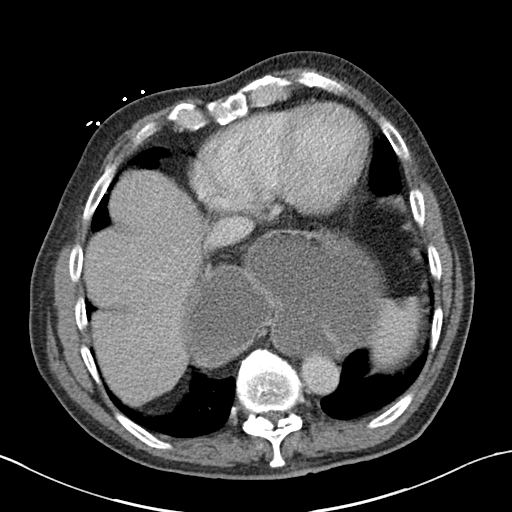

[Series 4: coronal st · coronal · 0.66mm/px · 3 of 100 slices shown]
[im 34/100  soft-tissue]
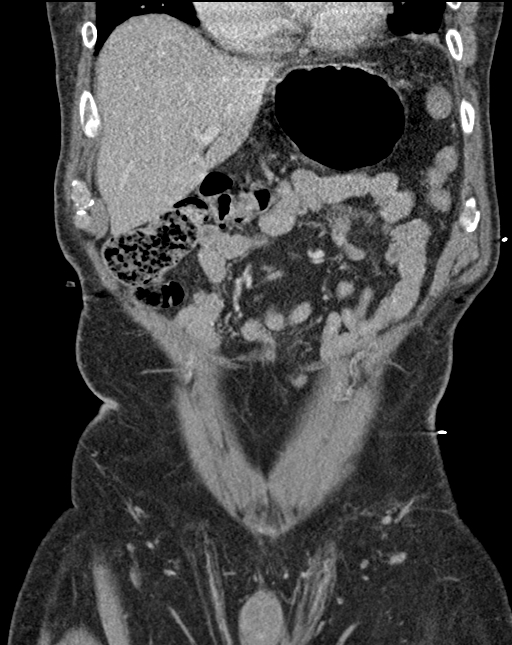
[im 45/100  soft-tissue]
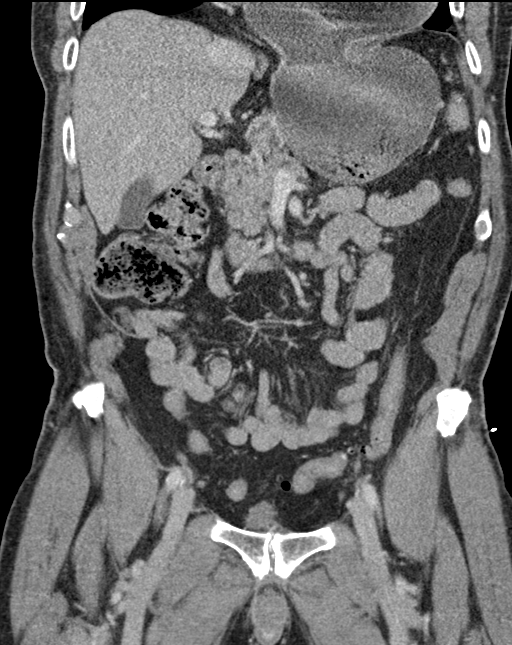
[im 56/100  soft-tissue]
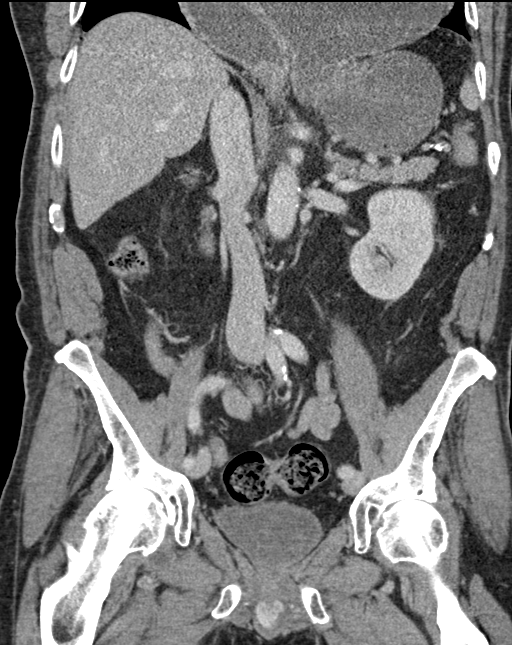

[15 of 46 positions shown; findings below may reference images not displayed]

FINDINGS: Lower chest: No acute abnormality.

Hepatobiliary: No focal liver abnormality is seen. No gallstones,
gallbladder wall thickening, or biliary dilatation.

Pancreas: Unremarkable. No pancreatic ductal dilatation or
surrounding inflammatory changes.

Spleen: Normal in size without focal abnormality.

Adrenals/Urinary Tract: Stable left adrenal nodule when measured in
the coronal plane and 23 mm and on the prior study in similar
fashion (series 4, image 63). Right kidney lower pole 5 mm
nonobstructing stone. Multiple small stable renal cyst. No
hydronephrosis or ureter stone. Normal bladder.

Stomach/Bowel: Large hiatal hernia containing the majority of the
stomach. Stomach is distended and fluid-filled with a portion of the
gastric body below the diaphragm. The gastric antrum is at the
gastroesophageal junction and the gastroesophageal junction is above
the field of view.

Appendix appears normal. No evidence of bowel wall thickening,
distention, or inflammatory changes. Sigmoid diverticulosis without
findings of acute diverticulitis.

Vascular/Lymphatic: Aortic atherosclerosis. No enlarged abdominal or
pelvic lymph nodes.

Reproductive: Prostate is unremarkable.

Other: No abdominal wall hernia or abnormality. No abdominopelvic
ascites.

Musculoskeletal: No fracture is seen. Multilevel degenerative
changes of the lumbar spine and mild-to-moderate bilateral hip
osteoarthrosis.
IMPRESSION: 1. Incompletely visualized large hiatal hernia containing the
majority of the stomach which is distended. The gastric antrum is at
the gastroesophageal junction and the gastroesophageal junction is
above the field of view. Partial obstruction may be present either
due to compression of the downward herniated gastric body and
gastric volvulus is also possible.
2. Right kidney lower pole nonobstructing stone.
3. Sigmoid diverticulosis without findings of acute diverticulitis.

## 2020-02-10 DIAGNOSIS — Z1159 Encounter for screening for other viral diseases: Secondary | ICD-10-CM | POA: Diagnosis not present

## 2020-02-13 DIAGNOSIS — K5289 Other specified noninfective gastroenteritis and colitis: Secondary | ICD-10-CM | POA: Diagnosis not present

## 2020-02-13 DIAGNOSIS — K529 Noninfective gastroenteritis and colitis, unspecified: Secondary | ICD-10-CM | POA: Diagnosis not present

## 2020-02-13 DIAGNOSIS — K5150 Left sided colitis without complications: Secondary | ICD-10-CM | POA: Diagnosis not present

## 2020-02-13 DIAGNOSIS — K6389 Other specified diseases of intestine: Secondary | ICD-10-CM | POA: Diagnosis not present

## 2020-02-13 DIAGNOSIS — K5730 Diverticulosis of large intestine without perforation or abscess without bleeding: Secondary | ICD-10-CM | POA: Diagnosis not present

## 2020-02-19 DIAGNOSIS — K5289 Other specified noninfective gastroenteritis and colitis: Secondary | ICD-10-CM | POA: Diagnosis not present

## 2020-03-18 DIAGNOSIS — I4891 Unspecified atrial fibrillation: Secondary | ICD-10-CM | POA: Diagnosis not present

## 2020-03-18 DIAGNOSIS — K5190 Ulcerative colitis, unspecified, without complications: Secondary | ICD-10-CM | POA: Diagnosis not present

## 2020-03-18 DIAGNOSIS — I129 Hypertensive chronic kidney disease with stage 1 through stage 4 chronic kidney disease, or unspecified chronic kidney disease: Secondary | ICD-10-CM | POA: Diagnosis not present

## 2020-04-01 DIAGNOSIS — K5150 Left sided colitis without complications: Secondary | ICD-10-CM | POA: Diagnosis not present

## 2020-04-14 ENCOUNTER — Other Ambulatory Visit: Payer: Self-pay | Admitting: Nurse Practitioner

## 2020-05-05 DIAGNOSIS — Z87891 Personal history of nicotine dependence: Secondary | ICD-10-CM | POA: Diagnosis not present

## 2020-05-05 DIAGNOSIS — Z79899 Other long term (current) drug therapy: Secondary | ICD-10-CM | POA: Diagnosis not present

## 2020-05-05 DIAGNOSIS — I10 Essential (primary) hypertension: Secondary | ICD-10-CM | POA: Diagnosis not present

## 2020-05-05 DIAGNOSIS — K439 Ventral hernia without obstruction or gangrene: Secondary | ICD-10-CM | POA: Diagnosis not present

## 2020-05-05 DIAGNOSIS — K4090 Unilateral inguinal hernia, without obstruction or gangrene, not specified as recurrent: Secondary | ICD-10-CM | POA: Diagnosis not present

## 2020-05-05 DIAGNOSIS — G4730 Sleep apnea, unspecified: Secondary | ICD-10-CM | POA: Diagnosis not present

## 2020-05-05 DIAGNOSIS — Z7982 Long term (current) use of aspirin: Secondary | ICD-10-CM | POA: Diagnosis not present

## 2020-05-05 DIAGNOSIS — Z792 Long term (current) use of antibiotics: Secondary | ICD-10-CM | POA: Diagnosis not present

## 2020-05-05 DIAGNOSIS — R011 Cardiac murmur, unspecified: Secondary | ICD-10-CM | POA: Diagnosis not present

## 2020-05-18 NOTE — Progress Notes (Signed)
CARDIOLOGY OFFICE NOTE  Date:  05/26/2020    Daniel Dalton. Date of Birth: Feb 18, 1941 Medical Record #[id number]  PCP:  Prince Solian, MD  Cardiologist:  Servando Snare  Chief Complaint  Patient presents with  . Follow-up    History of Present Illness: Daniel Dalton. is a 79 y.o. male who presents today for a 9 month check. Former patient of Dr. Claris Gladden - now following with me.   He has a history of PAF - on aspirin, UC on Sulfasalaine and hiatal hernia. He has OSA/cor pulmonale - has not had success with CPAP.   Presented back in January 2020 with abdominal pain - had coffee ground emesis. Gastric outlet obstruction noted and surgery was performed for his large hiatal hernia/volvulus - he had robotic reduction/repair/partial fundoplication on 7/61/6073. Stay was complicated by sinus tach/MAT/AF with RVR - required IV metoprolol until able to take po's. He has declined anticoagulation in the past. Discharged on 50 mg of Metoprolol.   I then saw him in March 2020 - he was doing well and felt like he was back at his baseline. Had been released from general surgery. Uses a dental appliance in place of CPAP - he has tried CPAP three times without success. Last seen in November - he had turned 68. Was doing well. Abdominal issues resolved. Remained a little anemic. Cardiac status was ok.   Comes in today. Here alone. Doing well. Remains in NSR. Has been working with some middle school baseball and doing more hiking - actually went on a hike that about 8 miles and did ok. Feels good. Has had some recent hernia repairs and is recovering from those nicely. Only on aspirin per his request. Seeing PCP next month with labs. No chest pain. Breathing is good. Not dizzy. No AF that he is aware of.  He has been vaccinated for COVID 19.   Past Medical History:  Diagnosis Date  . Acute respiratory failure with hypoxia ([place]) 11/07/2018  . Arrhythmia   . Arthritis   . Coffee ground  emesis 11/07/2018  . Colitis   . Family history of anesthesia complication    mother post- op NV  . Gastric outlet obstruction 11/07/2018  . Heart murmur   . History of kidney stones   . HNP (herniated nucleus pulposus), cervical 11/21/2013  . Hyperlipidemia 01/09/2011   Currently managed by Dr. Dagmar Hait.  He will continue to follow.     . Hypertension   . Incarcerated hiatal hernia 11/07/2018  . 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 Murmur 02/05/2013  . OSA (obstructive sleep apnea) 01/09/2011   Dr Lia Foyer discussed with him the relationship between OSA, and hypertension/atrial fibrillation.  He seems to understand the connection, and is seeing Dr. Wilburn Cornelia regarding repair of deviated septum.  We also discussed the relationship with his weight, and how that might influence things.  NPSG 05/17/11- AHI 27.1/ hr- Central and Obstructive Sleep apnea, moderately severe. Weight was 200 lbs. CPA  . Paroxysmal atrial fibrillation (HCC)    history of  . Prostate cancer ([place])    in remission  . S/P robotic partial posterior Toupet fundoplication 0/35/0093 05/27/2992  . Sebaceous cyst 01/30/2012  . Sleep apnea    uses CPAP  . 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 Ulcerative colitis ([place]) 01/09/2011   This influences his long term treatment in the potential for GI bleeding  on either warfarin or DTI.  At present, will settle on low dose ASA for protection.  Marland Kitchen Umbilical hernia     Past Surgical History:  Procedure Laterality Date  . ANTERIOR CERVICAL DECOMP/DISCECTOMY FUSION N/A 11/21/2013   Procedure: Cervical four-five, Cervical five-six, Cervical six-seven anterior cervical decompression with fusion plating and bonegraft;  Surgeon: Hosie Spangle, MD;  Location: [facility];  Service: Neurosurgery;  Laterality: N/A;  Cervical four-five, Cervical five-six, Cervical  six-seven anterior cervical decompression with fusion plating and bonegraft  . BACK SURGERY    . LEG SURGERY Right   . NASAL SINUS SURGERY    . PROSTATECTOMY       Medications: Current Meds  Medication Sig  . aspirin EC 81 MG tablet Take 1 tablet (81 mg total) by mouth daily.  . metoprolol tartrate (LOPRESSOR) 25 MG tablet TAKE 1 TABLET TWICE A DAY  . sulfaSALAzine (AZULFIDINE) 500 MG tablet Take 2,000 mg by mouth 2 (two) times daily.   Marland Kitchen telmisartan (MICARDIS) 80 MG tablet Take 80 mg by mouth daily.      Allergies: Allergies  Allergen Reactions  . Fluorescein Rash    Social History: The patient  reports that he quit smoking about 50 years ago. His smoking use included cigarettes. He has a 7.50 pack-year smoking history. He has quit using smokeless tobacco.  His smokeless tobacco use included chew. He reports that he does not drink alcohol and does not use drugs.   Family History: The patient's family history includes Anesthesia problems in his mother; Dementia (age of onset: 32) in his mother; Hypertension (age of onset: 61) in his sister; Hypertension (age of onset: 80) in his brother; Hypothyroidism in his sister; Parkinsonism (age of onset: 56) in his father.   Review of Systems: Please see the history of present illness.   All other systems are reviewed and negative.   Physical Exam: VS:  BP 120/70   Pulse 63   Ht 5' 10"  (1.778 m)   Wt 186 lb (84.4 kg)   BMI 26.69 kg/m  .  BMI Body mass index is 26.69 kg/m.  Wt Readings from Last 3 Encounters:  05/26/20 186 lb (84.4 kg)  08/20/19 189 lb 12.8 oz (86.1 kg)  12/10/18 188 lb 12.8 oz (85.6 kg)    General: Pleasant. Alert and in no acute distress. He looks younger than his stated age.  Cardiac: Regular rate and rhythm. No murmurs, rubs, or gallops. No edema.  Respiratory:  Lungs are clear to auscultation bilaterally with normal work of breathing.  GI: Soft and nontender.  MS: No deformity or atrophy. Gait and ROM  intact.  Skin: Warm and dry. Color is normal.  Neuro:  Strength and sensation are intact and no gross focal deficits noted.  Psych: Alert, appropriate and with normal affect.   LABORATORY DATA:  EKG:  EKG is ordered today.  Personally reviewed by me. This demonstrates NSR with 1st degree AV block - HR is 63.   Lab Results  Component Value Date   WBC 6.9 11/10/2018   HGB 9.9 (L) 11/12/2018   HCT 32.2 (L) 11/12/2018   PLT 225 11/10/2018   GLUCOSE 107 (H) 11/12/2018   TRIG 21 11/07/2018   ALT 130 (H) 11/10/2018   AST 63 (H) 11/10/2018   NA 137 11/12/2018   K 4.0 11/12/2018   CL 109 11/12/2018   CREATININE 0.82 11/12/2018   BUN 13 11/12/2018   CO2 22 11/12/2018   INR 0.97 11/07/2018  BNP (last 3 results) No results for input(s): BNP in the last 8760 hours.  ProBNP (last 3 results) No results for input(s): PROBNP in the last 8760 hours.   Other Studies Reviewed Today:  11/07/2018 Echo 1. The left ventricle appears to be normal in size, has moderate wall thickness 60-65% ejection fraction Spectral Doppler shows indeterminate pattern of diastolic filling. 2. The right ventricle has mildly enlarged size and normal systolic function. 3. Right ventricular systolic pressure is could not be assessed due to inability to visualize IVC. 4. Mildly dilated right atrial size. 5. Mitral valve regurgitation is trivial by color flow Doppler. 6. Tricuspid regurgitation is mild. 7. There is mild sclerosis of the aortic valve without stenosis   ASSESSMENT AND PLAN:  1. PAF - last episode was in January of 2020 in the setting of beta blocker interruption due to gastric obstruction/surgery - he has declined anticoagulation despite CHADSVASC of 3 - remains in NSR.   2. OSA - has not been able to use CPAP  3. HLD - labs by PCP - due later this month.   4. HTN - BP looks great on his current regimen. Continue Micardis.   5. Prior elevation in LFTs - looks like this has  improved off the KPN reviewed.   6. 1st degree AV block - will need to follow. HR is ok.   Current medicines are reviewed with the patient today.  The patient does not have concerns regarding medicines other than what has been noted above.  The following changes have been made:  See above.  Labs/ tests ordered today include:    Orders Placed This Encounter  Procedures  . EKG 12-Lead     Disposition:   FU with me in February of 2022.    Patient is agreeable to this plan and will call if any problems develop in the interim.   SignedTruitt Merle, NP  05/26/2020 8:25 AM  [facility] [address] [place], [place]  [address] Phone: [phone] Fax: [phone]

## 2020-05-26 ENCOUNTER — Encounter: Payer: Self-pay | Admitting: Nurse Practitioner

## 2020-05-26 ENCOUNTER — Other Ambulatory Visit: Payer: Self-pay

## 2020-05-26 ENCOUNTER — Ambulatory Visit: Payer: Medicare PPO | Admitting: Nurse Practitioner

## 2020-05-26 VITALS — BP 120/70 | HR 63 | Ht 70.0 in | Wt 186.0 lb

## 2020-05-26 DIAGNOSIS — I480 Paroxysmal atrial fibrillation: Secondary | ICD-10-CM | POA: Diagnosis not present

## 2020-05-26 DIAGNOSIS — I159 Secondary hypertension, unspecified: Secondary | ICD-10-CM

## 2020-05-26 DIAGNOSIS — G4733 Obstructive sleep apnea (adult) (pediatric): Secondary | ICD-10-CM

## 2020-05-26 NOTE — Patient Instructions (Addendum)
After Visit Summary:  We will be checking the following labs today - NONE   Medication Instructions:    Continue with your current medicines.    If you need a refill on your cardiac medications before your next appointment, please call your pharmacy.     Testing/Procedures To Be Arranged:  N/A  Follow-Up:   See me in February    At [facility], you and your health needs are our priority.  As part of our continuing mission to provide you with exceptional heart care, we have created designated Provider Care Teams.  These Care Teams include your primary Cardiologist (physician) and Advanced Practice Providers (APPs -  Physician Assistants and Nurse Practitioners) who all work together to provide you with the care you need, when you need it.  Special Instructions:  . Stay safe, wash your hands for at least 20 seconds and wear a mask when needed.  . It was good to talk with you today.  Marland Kitchen Keep up the good work.    Call the [place] office at [phone] if you have any questions, problems or concerns.

## 2020-06-03 DIAGNOSIS — H2513 Age-related nuclear cataract, bilateral: Secondary | ICD-10-CM | POA: Diagnosis not present

## 2020-06-03 DIAGNOSIS — H40033 Anatomical narrow angle, bilateral: Secondary | ICD-10-CM | POA: Diagnosis not present

## 2020-06-18 DIAGNOSIS — H25812 Combined forms of age-related cataract, left eye: Secondary | ICD-10-CM | POA: Diagnosis not present

## 2020-06-18 DIAGNOSIS — H35373 Puckering of macula, bilateral: Secondary | ICD-10-CM | POA: Diagnosis not present

## 2020-06-18 DIAGNOSIS — H353131 Nonexudative age-related macular degeneration, bilateral, early dry stage: Secondary | ICD-10-CM | POA: Diagnosis not present

## 2020-06-18 DIAGNOSIS — H26491 Other secondary cataract, right eye: Secondary | ICD-10-CM | POA: Diagnosis not present

## 2020-06-18 DIAGNOSIS — H2512 Age-related nuclear cataract, left eye: Secondary | ICD-10-CM | POA: Diagnosis not present

## 2020-06-25 DIAGNOSIS — H04123 Dry eye syndrome of bilateral lacrimal glands: Secondary | ICD-10-CM | POA: Diagnosis not present

## 2020-07-03 DIAGNOSIS — L720 Epidermal cyst: Secondary | ICD-10-CM | POA: Diagnosis not present

## 2020-07-09 DIAGNOSIS — L720 Epidermal cyst: Secondary | ICD-10-CM | POA: Diagnosis not present

## 2020-07-14 ENCOUNTER — Other Ambulatory Visit: Payer: Self-pay | Admitting: Nurse Practitioner

## 2020-07-29 DIAGNOSIS — I10 Essential (primary) hypertension: Secondary | ICD-10-CM | POA: Diagnosis not present

## 2020-07-29 DIAGNOSIS — E785 Hyperlipidemia, unspecified: Secondary | ICD-10-CM | POA: Diagnosis not present

## 2020-07-29 DIAGNOSIS — Z125 Encounter for screening for malignant neoplasm of prostate: Secondary | ICD-10-CM | POA: Diagnosis not present

## 2020-08-05 DIAGNOSIS — Z0000 Encounter for general adult medical examination without abnormal findings: Secondary | ICD-10-CM | POA: Diagnosis not present

## 2020-08-05 DIAGNOSIS — I4891 Unspecified atrial fibrillation: Secondary | ICD-10-CM | POA: Diagnosis not present

## 2020-08-05 DIAGNOSIS — R809 Proteinuria, unspecified: Secondary | ICD-10-CM | POA: Diagnosis not present

## 2020-08-05 DIAGNOSIS — R82998 Other abnormal findings in urine: Secondary | ICD-10-CM | POA: Diagnosis not present

## 2020-08-05 DIAGNOSIS — E785 Hyperlipidemia, unspecified: Secondary | ICD-10-CM | POA: Diagnosis not present

## 2020-08-05 DIAGNOSIS — G4733 Obstructive sleep apnea (adult) (pediatric): Secondary | ICD-10-CM | POA: Diagnosis not present

## 2020-08-05 DIAGNOSIS — I5032 Chronic diastolic (congestive) heart failure: Secondary | ICD-10-CM | POA: Diagnosis not present

## 2020-08-05 DIAGNOSIS — I279 Pulmonary heart disease, unspecified: Secondary | ICD-10-CM | POA: Diagnosis not present

## 2020-08-05 DIAGNOSIS — K5190 Ulcerative colitis, unspecified, without complications: Secondary | ICD-10-CM | POA: Diagnosis not present

## 2020-08-05 DIAGNOSIS — I129 Hypertensive chronic kidney disease with stage 1 through stage 4 chronic kidney disease, or unspecified chronic kidney disease: Secondary | ICD-10-CM | POA: Diagnosis not present

## 2020-08-13 ENCOUNTER — Other Ambulatory Visit: Payer: Medicare PPO

## 2020-08-13 DIAGNOSIS — Z20822 Contact with and (suspected) exposure to covid-19: Secondary | ICD-10-CM | POA: Diagnosis not present

## 2020-08-14 LAB — SARS-COV-2, NAA 2 DAY TAT

## 2020-08-14 LAB — NOVEL CORONAVIRUS, NAA: SARS-CoV-2, NAA: DETECTED — AB

## 2020-08-15 ENCOUNTER — Telehealth: Payer: Self-pay | Admitting: Physician Assistant

## 2020-08-15 NOTE — Telephone Encounter (Signed)
Called to Discuss with patient about Covid symptoms and the use of the monoclonal antibody infusion for those with mild to moderate Covid symptoms and at a high risk of hospitalization.     Pt is qualified for this infusion due to co-morbid conditions and/or a member of an at-risk group.     Patient Active Problem List   Diagnosis Date Noted  . Hypophosphatemia 11/10/2018  . 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 Gastric outlet obstruction 11/07/2018  . Coffee ground emesis 11/07/2018  . Incarcerated hiatal hernia 11/07/2018  . 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 S/P robotic partial posterior Toupet fundoplication 3/35/4562 56/38/9373  . Acute respiratory failure with hypoxia ([place]) 11/07/2018  . HNP (herniated nucleus pulposus), cervical 11/21/2013  . Murmur 02/05/2013  . 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 Arthritis 07/09/2012  . Sebaceous cyst 01/30/2012  . OSA (obstructive sleep apnea) 01/09/2011  . HTN (hypertension) 01/09/2011  . Ulcerative colitis ([place]) 01/09/2011  . Hyperlipidemia 01/09/2011    Patient declines infusion at this time. Symptoms tier reviewed as well as criteria for ending isolation. Preventative practices reviewed. Patient verbalized understanding.    Patient advised to call back if he/she decides that he/she does want to get infusion. Callback number to the infusion center given. Patient advised to go to Urgent care or ED with severe symptoms.    Sx started 08/13/20 Qualifies with SVI, age, HTN, UC.

## 2020-09-14 DIAGNOSIS — Z1212 Encounter for screening for malignant neoplasm of rectum: Secondary | ICD-10-CM | POA: Diagnosis not present

## 2020-10-28 NOTE — Progress Notes (Signed)
CARDIOLOGY OFFICE NOTE  Date:  11/10/2020    Daniel Dalton. Date of Birth: 20-Jan-1941 Medical Record #[id number]  PCP:  Prince Solian, MD  Cardiologist:  Servando Snare   Chief Complaint  Patient presents with  . Follow-up    History of Present Illness: Daniel Dalton. is a 80 y.o. male who presents today for a 6 month check. Prior patient of Dr. Claris Gladden.    He has a history ofPAF - on aspirin, UC on Sulfasalaine and hiatal hernia. He has OSA/cor pulmonale - has not had success with CPAP.   Presentedback in January2020with abdominal pain - had coffee ground emesis. Gastric outlet obstruction noted and surgery was performed for his large hiatal hernia/volvulus - he had robotic reduction/repair/partial fundoplication on 2/29/7989. Stay was complicated by sinus tach/MAT/AF with RVR - required IV metoprolol until able to take po's. He has declined anticoagulation in the past. Discharged on 50 mg of Metoprolol.  I then saw him in March 2020 - he was doing well and felt like he was back at his baseline. Had been released from general surgery. Uses a dental appliance in place of CPAP - he has tried CPAP three times without success.Last seen in November - he had turned 75. Was doing well. Abdominal issues resolved. Remained a little anemic. Cardiac status was ok.   Last seen by me back in August - he was doing well - was in NSR. Doing some hiking. Had hernia surgery without issue. Only on aspirin per his request.   Comes in today. Here alone. Doing well. Has not gotten out much due to the cold weather. No chest pain. Not short of breath. Seeing PCP next month with all his labs. Tolerating his medicines. No AF that he is aware of. He feels like he is doing well and has no concerns.    Past Medical History:  Diagnosis Date  . Acute respiratory failure with hypoxia ([place]) 11/07/2018  . Arrhythmia   . Arthritis   . Coffee ground emesis 11/07/2018  . Colitis   . Family history  of anesthesia complication    mother post- op NV  . Gastric outlet obstruction 11/07/2018  . Heart murmur   . History of kidney stones   . HNP (herniated nucleus pulposus), cervical 11/21/2013  . Hyperlipidemia 01/09/2011   Currently managed by Dr. Dagmar Hait.  He will continue to follow.     . Hypertension   . Incarcerated hiatal hernia 11/07/2018  . 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 Murmur 02/05/2013  . OSA (obstructive sleep apnea) 01/09/2011   Dr Lia Foyer discussed with him the relationship between OSA, and hypertension/atrial fibrillation.  He seems to understand the connection, and is seeing Dr. Wilburn Cornelia regarding repair of deviated septum.  We also discussed the relationship with his weight, and how that might influence things.  NPSG 05/17/11- AHI 27.1/ hr- Central and Obstructive Sleep apnea, moderately severe. Weight was 200 lbs. CPA  . Paroxysmal atrial fibrillation (HCC)    history of  . Prostate cancer ([place])    in remission  . S/P robotic partial posterior Toupet fundoplication 1/85/6314 9/70/2637  . Sebaceous cyst 01/30/2012  . Sleep apnea    uses CPAP  . 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 Ulcerative colitis ([place]) 01/09/2011   This influences his long term treatment in the potential for GI bleeding on either warfarin or DTI.  At  present, will settle on low dose ASA for protection.  Marland Kitchen Umbilical hernia     Past Surgical History:  Procedure Laterality Date  . ANTERIOR CERVICAL DECOMP/DISCECTOMY FUSION N/A 11/21/2013   Procedure: Cervical four-five, Cervical five-six, Cervical six-seven anterior cervical decompression with fusion plating and bonegraft;  Surgeon: Hosie Spangle, MD;  Location: [place] [facility];  Service: Neurosurgery;  Laterality: N/A;  Cervical four-five, Cervical five-six, Cervical six-seven anterior cervical decompression with  fusion plating and bonegraft  . BACK SURGERY    . LEG SURGERY Right   . NASAL SINUS SURGERY    . PROSTATECTOMY       Medications: Current Meds  Medication Sig  . aspirin EC 81 MG tablet Take 1 tablet (81 mg total) by mouth daily.  . metoprolol tartrate (LOPRESSOR) 25 MG tablet TAKE 1 TABLET TWICE A DAY  . sulfaSALAzine (AZULFIDINE) 500 MG tablet Take 2,000 mg by mouth 2 (two) times daily.  Marland Kitchen telmisartan (MICARDIS) 80 MG tablet Take 80 mg by mouth daily.      Allergies: Allergies  Allergen Reactions  . Fluorescein Rash    Social History: The patient  reports that he quit smoking about 51 years ago. His smoking use included cigarettes. He has a 7.50 pack-year smoking history. He has quit using smokeless tobacco.  His smokeless tobacco use included chew. He reports that he does not drink alcohol and does not use drugs.   Family History: The patient's family history includes Anesthesia problems in his mother; Dementia (age of onset: 69) in his mother; Hypertension (age of onset: 58) in his sister; Hypertension (age of onset: 24) in his brother; Hypothyroidism in his sister; Parkinsonism (age of onset: 12) in his father.   Review of Systems: Please see the history of present illness.   All other systems are reviewed and negative.   Physical Exam: VS:  BP 110/70   Pulse 69   Ht 5' 10"  (1.778 m)   Wt 200 lb (90.7 kg)   SpO2 97%   BMI 28.70 kg/m  .  BMI Body mass index is 28.7 kg/m.  Wt Readings from Last 3 Encounters:  11/10/20 200 lb (90.7 kg)  05/26/20 186 lb (84.4 kg)  08/20/19 189 lb 12.8 oz (86.1 kg)    General: Pleasant. Alert and in no acute distress.   Cardiac: Regular rate and rhythm. No murmurs, rubs, or gallops. No edema.  Respiratory:  Lungs are clear to auscultation bilaterally with normal work of breathing.  GI: Soft and nontender.  MS: No deformity or atrophy. Gait and ROM intact.  Skin: Warm and dry. Color is normal.  Neuro:  Strength and sensation are  intact and no gross focal deficits noted.  Psych: Alert, appropriate and with normal affect.   LABORATORY DATA:  EKG:  EKG is not ordered today.    Lab Results  Component Value Date   WBC 6.9 11/10/2018   HGB 9.9 (L) 11/12/2018   HCT 32.2 (L) 11/12/2018   PLT 225 11/10/2018   GLUCOSE 107 (H) 11/12/2018   TRIG 21 11/07/2018   ALT 130 (H) 11/10/2018   AST 63 (H) 11/10/2018   NA 137 11/12/2018   K 4.0 11/12/2018   CL 109 11/12/2018   CREATININE 0.82 11/12/2018   BUN 13 11/12/2018   CO2 22 11/12/2018   INR 0.97 11/07/2018       BNP (last 3 results) No results for input(s): BNP in the last 8760 hours.  ProBNP (last 3 results) No results  for input(s): PROBNP in the last 8760 hours.   Other Studies Reviewed Today:  11/07/2018 Echo 1. The left ventricle appears to be normal in size, has moderate wall thickness 60-65% ejection fraction Spectral Doppler shows indeterminate pattern of diastolic filling. 2. The right ventricle has mildly enlarged size and normal systolic function. 3. Right ventricular systolic pressure is could not be assessed due to inability to visualize IVC. 4. Mildly dilated right atrial size. 5. Mitral valve regurgitation is trivial by color flow Doppler. 6. Tricuspid regurgitation is mild. 7. There is mild sclerosis of the aortic valve without stenosis   ASSESSMENT AND PLAN:  1. PAF - last episode in January of 2020 in the setting of beta blocker interruption due to gastric obstruction/surgery - he has declined anticoagulation - only on aspirin - remains in NSR.   2. OSA - not able to use CPAP  3. HTN - BP is fine on his current regimen.   Current medicines are reviewed with the patient today.  The patient does not have concerns regarding medicines other than what has been noted above.  The following changes have been made:  See above.  Labs/ tests ordered today include:   No orders of the defined types were placed in this  encounter.    Disposition:   FU with Richardson Dopp, PA in 6 months. Mallie Mussel is aware that I am leaving later this month.   Patient is agreeable to this plan and will call if any problems develop in the interim.   SignedTruitt Merle, NP  11/10/2020 8:38 AM  [facility] [address] [place], [place]  [address] Phone: [phone] Fax: [phone]

## 2020-11-10 ENCOUNTER — Other Ambulatory Visit: Payer: Self-pay

## 2020-11-10 ENCOUNTER — Encounter: Payer: Self-pay | Admitting: Nurse Practitioner

## 2020-11-10 ENCOUNTER — Ambulatory Visit: Payer: Medicare PPO | Admitting: Nurse Practitioner

## 2020-11-10 VITALS — BP 110/70 | HR 69 | Ht 70.0 in | Wt 200.0 lb

## 2020-11-10 DIAGNOSIS — I480 Paroxysmal atrial fibrillation: Secondary | ICD-10-CM | POA: Diagnosis not present

## 2020-11-10 DIAGNOSIS — G4733 Obstructive sleep apnea (adult) (pediatric): Secondary | ICD-10-CM | POA: Diagnosis not present

## 2020-11-10 NOTE — Patient Instructions (Addendum)
After Visit Summary:  We will be checking the following labs today - NONE   Medication Instructions:    Continue with your current medicines.    If you need a refill on your cardiac medications before your next appointment, please call your pharmacy.     Testing/Procedures To Be Arranged:  N/A  Follow-Up:   See Richardson Dopp, PA in 6 months.     At [facility], you and your health needs are our priority.  As part of our continuing mission to provide you with exceptional heart care, we have created designated Provider Care Teams.  These Care Teams include your primary Cardiologist (physician) and Advanced Practice Providers (APPs -  Physician Assistants and Nurse Practitioners) who all work together to provide you with the care you need, when you need it.  Special Instructions:  . Stay safe, wash your hands for at least 20 seconds and wear a mask when needed.  . It was good to talk with you today.    Call the [place] office at [phone] if you have any questions, problems or concerns.

## 2021-02-05 DIAGNOSIS — I129 Hypertensive chronic kidney disease with stage 1 through stage 4 chronic kidney disease, or unspecified chronic kidney disease: Secondary | ICD-10-CM | POA: Diagnosis not present

## 2021-02-05 DIAGNOSIS — I4891 Unspecified atrial fibrillation: Secondary | ICD-10-CM | POA: Diagnosis not present

## 2021-02-05 DIAGNOSIS — I5032 Chronic diastolic (congestive) heart failure: Secondary | ICD-10-CM | POA: Diagnosis not present

## 2021-02-05 DIAGNOSIS — M1990 Unspecified osteoarthritis, unspecified site: Secondary | ICD-10-CM | POA: Diagnosis not present

## 2021-02-05 DIAGNOSIS — R809 Proteinuria, unspecified: Secondary | ICD-10-CM | POA: Diagnosis not present

## 2021-02-05 DIAGNOSIS — I279 Pulmonary heart disease, unspecified: Secondary | ICD-10-CM | POA: Diagnosis not present

## 2021-02-05 DIAGNOSIS — E785 Hyperlipidemia, unspecified: Secondary | ICD-10-CM | POA: Diagnosis not present

## 2021-02-05 DIAGNOSIS — D692 Other nonthrombocytopenic purpura: Secondary | ICD-10-CM | POA: Diagnosis not present

## 2021-02-05 DIAGNOSIS — Z23 Encounter for immunization: Secondary | ICD-10-CM | POA: Diagnosis not present

## 2021-04-05 ENCOUNTER — Telehealth: Payer: Self-pay

## 2021-04-05 MED ORDER — METOPROLOL TARTRATE 25 MG PO TABS: 25.0000 mg | ORAL_TABLET | Freq: Two times a day (BID) | ORAL | 0 refills | Status: DC

## 2021-04-05 NOTE — Telephone Encounter (Signed)
Received a call from [facility] pharmacist stating the patient has contacted them for a refill. He states he has been trying to refill the rx under [facility] name, but it is not going through. He wants to know how he can get the rx filled.   I advised him that I would look into and get back ton him or send the rx over once I speak with one of my colleagues. He voiced understanding.   Spoke with colleague and informed to refill medication under Richardson Dopp, PA-C since the patient has been advised to follow up with him.   Rx(s) sent to pharmacy electronically.

## 2021-06-09 DIAGNOSIS — I5032 Chronic diastolic (congestive) heart failure: Secondary | ICD-10-CM | POA: Diagnosis not present

## 2021-06-09 DIAGNOSIS — I129 Hypertensive chronic kidney disease with stage 1 through stage 4 chronic kidney disease, or unspecified chronic kidney disease: Secondary | ICD-10-CM | POA: Diagnosis not present

## 2021-06-09 DIAGNOSIS — R0789 Other chest pain: Secondary | ICD-10-CM | POA: Diagnosis not present

## 2021-06-09 DIAGNOSIS — I4891 Unspecified atrial fibrillation: Secondary | ICD-10-CM | POA: Diagnosis not present

## 2021-06-09 DIAGNOSIS — I279 Pulmonary heart disease, unspecified: Secondary | ICD-10-CM | POA: Diagnosis not present

## 2021-06-09 DIAGNOSIS — G4733 Obstructive sleep apnea (adult) (pediatric): Secondary | ICD-10-CM | POA: Diagnosis not present

## 2021-06-28 ENCOUNTER — Other Ambulatory Visit: Payer: Self-pay | Admitting: Physician Assistant

## 2021-07-09 DIAGNOSIS — I5032 Chronic diastolic (congestive) heart failure: Secondary | ICD-10-CM | POA: Diagnosis not present

## 2021-07-09 DIAGNOSIS — I4891 Unspecified atrial fibrillation: Secondary | ICD-10-CM | POA: Diagnosis not present

## 2021-07-09 DIAGNOSIS — I129 Hypertensive chronic kidney disease with stage 1 through stage 4 chronic kidney disease, or unspecified chronic kidney disease: Secondary | ICD-10-CM | POA: Diagnosis not present

## 2021-07-09 DIAGNOSIS — I279 Pulmonary heart disease, unspecified: Secondary | ICD-10-CM | POA: Diagnosis not present

## 2021-07-15 DIAGNOSIS — K5150 Left sided colitis without complications: Secondary | ICD-10-CM | POA: Diagnosis not present

## 2021-07-21 DIAGNOSIS — G4733 Obstructive sleep apnea (adult) (pediatric): Secondary | ICD-10-CM | POA: Diagnosis not present

## 2021-08-30 DIAGNOSIS — Z125 Encounter for screening for malignant neoplasm of prostate: Secondary | ICD-10-CM | POA: Diagnosis not present

## 2021-08-30 DIAGNOSIS — I10 Essential (primary) hypertension: Secondary | ICD-10-CM | POA: Diagnosis not present

## 2021-08-30 DIAGNOSIS — E785 Hyperlipidemia, unspecified: Secondary | ICD-10-CM | POA: Diagnosis not present

## 2021-09-06 DIAGNOSIS — I5032 Chronic diastolic (congestive) heart failure: Secondary | ICD-10-CM | POA: Diagnosis not present

## 2021-09-06 DIAGNOSIS — I4891 Unspecified atrial fibrillation: Secondary | ICD-10-CM | POA: Diagnosis not present

## 2021-09-06 DIAGNOSIS — I129 Hypertensive chronic kidney disease with stage 1 through stage 4 chronic kidney disease, or unspecified chronic kidney disease: Secondary | ICD-10-CM | POA: Diagnosis not present

## 2021-09-06 DIAGNOSIS — R82998 Other abnormal findings in urine: Secondary | ICD-10-CM | POA: Diagnosis not present

## 2021-09-06 DIAGNOSIS — E785 Hyperlipidemia, unspecified: Secondary | ICD-10-CM | POA: Diagnosis not present

## 2021-09-06 DIAGNOSIS — K5190 Ulcerative colitis, unspecified, without complications: Secondary | ICD-10-CM | POA: Diagnosis not present

## 2021-09-06 DIAGNOSIS — I279 Pulmonary heart disease, unspecified: Secondary | ICD-10-CM | POA: Diagnosis not present

## 2021-09-06 DIAGNOSIS — R809 Proteinuria, unspecified: Secondary | ICD-10-CM | POA: Diagnosis not present

## 2021-09-06 DIAGNOSIS — Z0000 Encounter for general adult medical examination without abnormal findings: Secondary | ICD-10-CM | POA: Diagnosis not present

## 2021-09-06 DIAGNOSIS — I10 Essential (primary) hypertension: Secondary | ICD-10-CM | POA: Diagnosis not present

## 2021-09-06 DIAGNOSIS — D692 Other nonthrombocytopenic purpura: Secondary | ICD-10-CM | POA: Diagnosis not present

## 2021-10-12 NOTE — Progress Notes (Signed)
Cardiology Office Note:    Date:  10/13/2021   ID:  [name]., DOB 04-03-1941, MRN [id number]  PCP:  Prince Solian, MD   [facility] Providers Cardiologist:  Freada Bergeron, MD Cardiology APP:  Sharmon Revere     Referring MD: Prince Solian, MD   Chief Complaint:  F/u for AFib    Patient Profile:   Daniel Dalton. is a 81 y.o. male with:  Paroxysmal atrial fibrillation  Pt has declined anticoagulation in past - on ASA only Echocardiogram 10/2018: EF 60-65, mild RAE, mod LVH Hypertension  Hyperlipidemia  Ulcerative colitis OSA (dental appliance instead of CPAP) Prostate CA s/p prostatectomy Lg hiatal hernia 10/2018 - coffee ground emesis >> gastric outlet obstruction s/p repair of hiatal hernia, volvulus  C/b AF w RVR, MAT, ST >> Rx w beta-blocker   History of Present Illness: Mr. Damron was a prior patient of Dr. Aundra Dubin and has primarily been seen by Truitt Merle, NP since [facility].  He last saw Cecille Rubin in 2/22 prior to her retirement.  He returns for f/u.  He is here alone.  He is doing well without chest pain, shortness of breath, syncope, leg edema.  He continues to do daily exercises (stretching, light weights) and walks a mile a day.   He is a widower.  He has 2 children (40 and 67) and 2 grandchildren.  He retired from Dealer at [employer]      ASSESSMENT & PLAN:   PAF (paroxysmal atrial fibrillation) (HCC) CHA2DS2-VASc Score = 3 [CHF History: 0, HTN History: 1, Diabetes History: 0, Stroke History: 0, Vascular Disease History: 0, Age Score: 2, Gender Score: 0].  Therefore, the patient's annual risk of stroke is 3.2 %.     He is maintaining sinus rhythm.  He has declined anticoagulation in the past.    HTN (hypertension) The patient's blood pressure is controlled on his current regimen.  Continue current therapy.    OSA (obstructive sleep apnea) He uses an oral appliance instead of CPAP.          Dispo:  He was primarily seen by Truitt Merle, NP prior to her retirement.  I will see him going forward along with Dr. Johney Frame as needed. Return in about 1 year (around 10/13/2022) for Routine follow up in 1 [facility] with [facility]..    Prior CV studies: Echocardiogram 11/07/2018 Moderate LVH, EF 60-65, normal RVSF, mild RAE, trivial MR, mild TR, mild AV sclerosis without AS   Past Medical History:  Diagnosis Date   Acute respiratory failure with hypoxia ([place]) 11/07/2018   Arrhythmia    Arthritis    Coffee ground emesis 11/07/2018   Colitis    Family history of anesthesia complication    mother post- op NV   Gastric outlet obstruction 11/07/2018   Heart murmur    History of kidney stones    HNP (herniated nucleus pulposus), cervical 11/21/2013   Hyperlipidemia 01/09/2011   Currently managed by Dr. Dagmar Hait.  He will continue to follow.      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  He seems to understand the connection, and is seeing Dr. Wilburn Cornelia  regarding repair of deviated septum.  We also discussed the relationship with his weight, and how that might influence things.  NPSG 05/17/11- AHI 27.1/ hr- Central and Obstructive Sleep apnea, moderately severe. Weight was 200 lbs. 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  At present, will settle on low dose ASA for protection.   Umbilical hernia    Current Medications: Current Meds  Medication Sig   aspirin EC 81 MG tablet Take 1 tablet (81 mg total) by mouth daily.   metoprolol tartrate (LOPRESSOR) 25 MG tablet TAKE ONE TABLET TWICE DAILY   sulfaSALAzine (AZULFIDINE) 500 MG tablet Take 2,000 mg by mouth 2 (two) times daily.   telmisartan (MICARDIS) 80 MG tablet Take 80 mg by mouth daily.     Allergies:   Fluorescein   Social History   Tobacco Use   Smoking status: Former    Packs/day: 0.50    Years: 15.00    Pack years: 7.50    Types: Cigarettes    Quit date: 10/10/1969    Years since quitting: 52.0   Smokeless tobacco: Former    Types: Nurse, children's Use: Never used  Substance Use Topics   Alcohol use: No   Drug use: No    Family Hx: The patient's family history includes Anesthesia problems in his mother; Dementia (age of onset: 100) in his mother; Hypertension (age of onset: 44) in his sister; Hypertension (age of onset: 40) in his brother; Hypothyroidism in his sister; Parkinsonism (age of onset: 89) in his father.  Review of Systems  Cardiovascular:  Negative for palpitations.  Gastrointestinal:  Negative for hematochezia.  Genitourinary:  Negative for hematuria.    EKGs/Labs/Other Test Reviewed:    EKG:  EKG is   ordered today.  The ekg ordered today demonstrates sinus bradycardia, HR 55, 1st degree AVB, PR 222, QTc 401, early repol, non-specific ST-TW changes  Recent Labs: No results found for requested labs within last 8760 hours.   Recent Lipid Panel Lab Results  Component Value Date/Time   TRIG 21 11/07/2018 07:08 PM   Labs from primary care personally reviewed/interpreted: 08/30/2021: Creatinine 1.0, K+ 4.9, albumin 4.3, ALT 7, Hgb 12.1, PLT 259K, TSH 1.98, total cholesterol 203, triglycerides 66, HDL 70, LDL 120  Risk Assessment/Calculations:    CHA2DS2-VASc Score = 3    This indicates a 3.2% annual risk of stroke. The patient's score is based upon: CHF History: 0 HTN History: 1 Diabetes History: 0 Stroke History: 0 Vascular Disease History: 0 Age Score: 2 Gender Score: 0         Physical Exam:    VS:  BP 122/60    Pulse (!) 55    Ht 5\' 10"  (1.778 m)    Wt 188 lb 6.4 oz (85.5 kg)    SpO2 97%    BMI 27.03 kg/m     Wt Readings from Last 3 Encounters:  10/13/21 188 lb 6.4 oz (85.5 kg)  11/10/20 200 lb (90.7 kg)  05/26/20 186 lb (84.4 kg)    Constitutional:  Appearance: Healthy appearance. Not in distress.  Neck:     Vascular: No carotid bruit. JVD normal.  Pulmonary:     Effort: Pulmonary effort is normal.     Breath sounds: No wheezing. No rales.  Cardiovascular:     Normal rate. Regular rhythm. Normal S1. Normal S2.      Murmurs: There is no murmur.  Edema:    Peripheral edema present.    Ankle: bilateral trace edema of the ankle. Abdominal:     Palpations: Abdomen is soft.  Skin:    General: Skin is warm and dry.  Neurological:     General: No focal deficit present.     Mental Status: Alert and oriented to person, place and time.     Cranial Nerves: Cranial nerves are intact.        Medication Adjustments/Labs and Tests Ordered: Current medicines are reviewed at length with the patient today.  Concerns regarding medicines are outlined above.  Tests Ordered: Orders Placed This Encounter  Procedures   EKG 12-Lead   Medication Changes: No orders of the defined types were placed in this encounter.  Signed, Richardson Dopp, PA-C  10/13/2021 11:17 AM    [facility] [place], [place],   [address] Phone: [phone]; Fax: [phone]

## 2021-10-13 ENCOUNTER — Encounter: Payer: Self-pay | Admitting: Physician Assistant

## 2021-10-13 ENCOUNTER — Ambulatory Visit: Payer: Medicare PPO | Admitting: Physician Assistant

## 2021-10-13 ENCOUNTER — Other Ambulatory Visit: Payer: Self-pay

## 2021-10-13 VITALS — BP 122/60 | HR 55 | Ht 70.0 in | Wt 188.4 lb

## 2021-10-13 DIAGNOSIS — I10 Essential (primary) hypertension: Secondary | ICD-10-CM

## 2021-10-13 DIAGNOSIS — G4733 Obstructive sleep apnea (adult) (pediatric): Secondary | ICD-10-CM

## 2021-10-13 DIAGNOSIS — I480 Paroxysmal atrial fibrillation: Secondary | ICD-10-CM

## 2021-10-13 DIAGNOSIS — K51919 Ulcerative colitis, unspecified with unspecified complications: Secondary | ICD-10-CM

## 2021-10-13 NOTE — Assessment & Plan Note (Signed)
The patient's blood pressure is controlled on his current regimen.  Continue current therapy.  

## 2021-10-13 NOTE — Assessment & Plan Note (Signed)
He uses an oral appliance instead of CPAP.

## 2021-10-13 NOTE — Assessment & Plan Note (Signed)
CHA2DS2-VASc Score = 3 [CHF History: 0, HTN History: 1, Diabetes History: 0, Stroke History: 0, Vascular Disease History: 0, Age Score: 2, Gender Score: 0].  Therefore, the patient's annual risk of stroke is 3.2 %.     He is maintaining sinus rhythm.  He has declined anticoagulation in the past.

## 2021-10-13 NOTE — Patient Instructions (Signed)
Medication Instructions:   Your physician recommends that you continue on your current medications as directed. Please refer to the Current Medication list given to you today.  *If you need a refill on your cardiac medications before your next appointment, please call your pharmacy*   Lab Work:  -NONE  If you have labs (blood work) drawn today and your tests are completely normal, you will receive your results only by: [place] (if you have MyChart) OR A paper copy in the mail If you have any lab test that is abnormal or we need to change your treatment, we will call you to review the results.   Testing/Procedures:  -NONE   Follow-Up: At [facility], you and your health needs are our priority.  As part of our continuing mission to provide you with exceptional heart care, we have created designated Provider Care Teams.  These Care Teams include your primary Cardiologist (physician) and Advanced Practice Providers (APPs -  Physician Assistants and Nurse Practitioners) who all work together to provide you with the care you need, when you need it.  We recommend signing up for the patient portal called "MyChart".  Sign up information is provided on this After Visit Summary.  MyChart is used to connect with patients for Virtual Visits (Telemedicine).  Patients are able to view lab/test results, encounter notes, upcoming appointments, etc.  Non-urgent messages can be sent to your provider as well.   To learn more about what you can do with MyChart, go to [email or web address].    Your next appointment:   1 year(s)  The format for your next appointment:   In Person  Provider:   Richardson Dopp, PA-C     Other Instructions  Your physician wants you to follow-up in: 1 year with Richardson Dopp, PA-C.  You will receive a reminder letter in the mail two months in advance. If you don't receive a letter, please call our office to schedule the follow-up appointment.

## 2021-12-15 DIAGNOSIS — Z87442 Personal history of urinary calculi: Secondary | ICD-10-CM | POA: Diagnosis not present

## 2021-12-15 DIAGNOSIS — R109 Unspecified abdominal pain: Secondary | ICD-10-CM | POA: Diagnosis not present

## 2021-12-17 DIAGNOSIS — K5730 Diverticulosis of large intestine without perforation or abscess without bleeding: Secondary | ICD-10-CM | POA: Diagnosis not present

## 2021-12-17 DIAGNOSIS — N281 Cyst of kidney, acquired: Secondary | ICD-10-CM | POA: Diagnosis not present

## 2021-12-17 DIAGNOSIS — N200 Calculus of kidney: Secondary | ICD-10-CM | POA: Diagnosis not present

## 2022-01-03 ENCOUNTER — Other Ambulatory Visit: Payer: Self-pay | Admitting: Physician Assistant

## 2022-05-11 DIAGNOSIS — K5150 Left sided colitis without complications: Secondary | ICD-10-CM | POA: Diagnosis not present

## 2022-06-16 DIAGNOSIS — N200 Calculus of kidney: Secondary | ICD-10-CM | POA: Diagnosis not present

## 2022-07-04 ENCOUNTER — Other Ambulatory Visit: Payer: Self-pay | Admitting: Physician Assistant

## 2022-09-12 DIAGNOSIS — E785 Hyperlipidemia, unspecified: Secondary | ICD-10-CM | POA: Diagnosis not present

## 2022-09-12 DIAGNOSIS — I4891 Unspecified atrial fibrillation: Secondary | ICD-10-CM | POA: Diagnosis not present

## 2022-09-12 DIAGNOSIS — R7989 Other specified abnormal findings of blood chemistry: Secondary | ICD-10-CM | POA: Diagnosis not present

## 2022-09-12 DIAGNOSIS — Z125 Encounter for screening for malignant neoplasm of prostate: Secondary | ICD-10-CM | POA: Diagnosis not present

## 2022-09-19 DIAGNOSIS — I129 Hypertensive chronic kidney disease with stage 1 through stage 4 chronic kidney disease, or unspecified chronic kidney disease: Secondary | ICD-10-CM | POA: Diagnosis not present

## 2022-09-19 DIAGNOSIS — I279 Pulmonary heart disease, unspecified: Secondary | ICD-10-CM | POA: Diagnosis not present

## 2022-09-19 DIAGNOSIS — G4733 Obstructive sleep apnea (adult) (pediatric): Secondary | ICD-10-CM | POA: Diagnosis not present

## 2022-09-19 DIAGNOSIS — Z0000 Encounter for general adult medical examination without abnormal findings: Secondary | ICD-10-CM | POA: Diagnosis not present

## 2022-09-19 DIAGNOSIS — I4891 Unspecified atrial fibrillation: Secondary | ICD-10-CM | POA: Diagnosis not present

## 2022-09-19 DIAGNOSIS — C61 Malignant neoplasm of prostate: Secondary | ICD-10-CM | POA: Diagnosis not present

## 2022-09-19 DIAGNOSIS — R82998 Other abnormal findings in urine: Secondary | ICD-10-CM | POA: Diagnosis not present

## 2022-09-19 DIAGNOSIS — I5032 Chronic diastolic (congestive) heart failure: Secondary | ICD-10-CM | POA: Diagnosis not present

## 2022-09-19 DIAGNOSIS — E785 Hyperlipidemia, unspecified: Secondary | ICD-10-CM | POA: Diagnosis not present

## 2022-09-19 DIAGNOSIS — K5190 Ulcerative colitis, unspecified, without complications: Secondary | ICD-10-CM | POA: Diagnosis not present

## 2022-10-30 NOTE — Progress Notes (Signed)
H&P  Chief Complaint: Proteinuria  History of Present Illness: 82 year old male sent by Dr. Michel Harrow, apparently for evaluation and management of proteinuria.  He does have a prior urologic history, having had a radical prostatectomy by Dr. Einar Crow in 2006.  He has had no evidence of recurrence so far.  He has not needed urologic consultation since that time.  Most recent PSA was performed in December 2023 and was undetectable.  He has normal renal function based on his last CMP-GFR was 92.  He has no significant lower urinary tract symptoms.  Apparently he has had evaluation of his proteinuria before.  Past Medical History:  Diagnosis Date   Acute respiratory failure with hypoxia ([place]) 11/07/2018   Arrhythmia    Arthritis    Coffee ground emesis 11/07/2018   Colitis    Family history of anesthesia complication    mother post- op NV   Gastric outlet obstruction 11/07/2018   Heart murmur    History of kidney stones    HNP (herniated nucleus pulposus), cervical 11/21/2013   Hyperlipidemia 01/09/2011   Currently managed by Dr. Dagmar Hait.  He will continue to follow.      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  He seems to understand the connection, and is seeing Dr. Wilburn Cornelia regarding repair of deviated septum.  We also discussed the relationship with his weight, and how that might influence things.  NPSG 05/17/11- AHI 27.1/ hr- Central and Obstructive Sleep apnea, moderately severe. Weight was 200 lbs. 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  At present, will settle on low dose ASA for protection.   Umbilical hernia     Past Surgical History:  Procedure Laterality Date   ANTERIOR CERVICAL DECOMP/DISCECTOMY FUSION N/A 11/21/2013   Procedure: Cervical four-five, Cervical five-six, Cervical six-seven anterior cervical decompression with fusion plating and bonegraft;  Surgeon: Hosie Spangle, MD;  Location: [facility];  Service: Neurosurgery;  Laterality: N/A;  Cervical four-five, Cervical five-six, Cervical six-seven anterior cervical decompression with fusion plating and bonegraft   BACK SURGERY     LEG SURGERY Right    NASAL SINUS SURGERY     PROSTATECTOMY      Home Medications:  Allergies as of 11/01/2022       Reactions   Fluorescein Rash        Medication List        Accurate as of October 30, 2022 11:47 AM. If you have any questions, ask your nurse or doctor.          aspirin EC 81 MG tablet Take 1 tablet (81 mg total) by mouth daily.   metoprolol tartrate 25 MG tablet Commonly known as: LOPRESSOR TAKE ONE TABLET TWICE DAILY   sulfaSALAzine 500 MG tablet Commonly known as: AZULFIDINE Take 2,000 mg by mouth  2 (two) times daily.   telmisartan 80 MG tablet Commonly known as: MICARDIS Take 80 mg by mouth daily.        Allergies:  Allergies  Allergen Reactions   Fluorescein Rash    Family History  Problem Relation Age of Onset   Parkinsonism Father 70       deceased   Dementia Mother 44       deceased   Anesthesia problems Mother    Hypertension Brother 8       alive   Hypertension Sister 53       alive   Hypothyroidism Sister     Social History:  reports that he quit smoking about 53 years ago. His smoking  use included cigarettes. He has a 7.50 pack-year smoking history. He has quit using smokeless tobacco.  His smokeless tobacco use included chew. He reports that he does not drink alcohol and does not use drugs.  ROS: A complete review of systems was performed.  All systems are negative except for pertinent findings as noted.  Physical Exam:  Vital signs in last 24 hours: There were no vitals taken for this visit. Constitutional:  Alert and oriented, No acute distress Cardiovascular: Regular rate  Respiratory: Normal respiratory effort Neurologic: Grossly intact, no focal deficits Psychiatric: Normal mood and affect  I have reviewed prior pt notes from Dr. Dagmar Hait  I have reviewed urinalysis results-1+ proteinuria present today.  Microscopic negative  I have independently reviewed prior imaging-CT scan from 2020 revealed a right lower pole renal calculus without hydronephrosis.  I have reviewed prior PSA results     Impression/Assessment:  Proteinuria-1+ on dipstick today.  Not typically worked up by a urologist  Plan:  1.  I reassured him that overall his renal function is great  2.  I will contact Dr. Dagmar Hait personally about his present

## 2022-11-01 ENCOUNTER — Encounter: Payer: Self-pay | Admitting: Urology

## 2022-11-01 ENCOUNTER — Ambulatory Visit: Payer: Medicare PPO | Admitting: Urology

## 2022-11-01 VITALS — BP 165/84 | HR 61

## 2022-11-01 DIAGNOSIS — R809 Proteinuria, unspecified: Secondary | ICD-10-CM | POA: Diagnosis not present

## 2022-11-01 LAB — URINALYSIS, ROUTINE W REFLEX MICROSCOPIC
Bilirubin, UA: NEGATIVE
Glucose, UA: NEGATIVE
Ketones, UA: NEGATIVE
Leukocytes,UA: NEGATIVE
Nitrite, UA: NEGATIVE
RBC, UA: NEGATIVE
Specific Gravity, UA: 1.015 (ref 1.005–1.030)
Urobilinogen, Ur: 0.2 mg/dL (ref 0.2–1.0)
pH, UA: 5.5 (ref 5.0–7.5)

## 2022-11-01 LAB — MICROSCOPIC EXAMINATION: Bacteria, UA: NONE SEEN

## 2023-01-25 DIAGNOSIS — K5150 Left sided colitis without complications: Secondary | ICD-10-CM | POA: Diagnosis not present

## 2023-03-29 DIAGNOSIS — D123 Benign neoplasm of transverse colon: Secondary | ICD-10-CM | POA: Diagnosis not present

## 2023-03-29 DIAGNOSIS — K649 Unspecified hemorrhoids: Secondary | ICD-10-CM | POA: Diagnosis not present

## 2023-03-29 DIAGNOSIS — K5100 Ulcerative (chronic) pancolitis without complications: Secondary | ICD-10-CM | POA: Diagnosis not present

## 2023-03-29 DIAGNOSIS — K5730 Diverticulosis of large intestine without perforation or abscess without bleeding: Secondary | ICD-10-CM | POA: Diagnosis not present

## 2023-03-29 DIAGNOSIS — K5289 Other specified noninfective gastroenteritis and colitis: Secondary | ICD-10-CM | POA: Diagnosis not present

## 2023-03-29 DIAGNOSIS — K6289 Other specified diseases of anus and rectum: Secondary | ICD-10-CM | POA: Diagnosis not present

## 2023-03-31 DIAGNOSIS — K5289 Other specified noninfective gastroenteritis and colitis: Secondary | ICD-10-CM | POA: Diagnosis not present

## 2023-03-31 DIAGNOSIS — D123 Benign neoplasm of transverse colon: Secondary | ICD-10-CM | POA: Diagnosis not present

## 2023-07-14 DIAGNOSIS — H3581 Retinal edema: Secondary | ICD-10-CM | POA: Diagnosis not present

## 2023-07-14 DIAGNOSIS — I10 Essential (primary) hypertension: Secondary | ICD-10-CM | POA: Diagnosis not present

## 2023-07-17 DIAGNOSIS — H35713 Central serous chorioretinopathy, bilateral: Secondary | ICD-10-CM | POA: Diagnosis not present

## 2023-07-17 DIAGNOSIS — Z961 Presence of intraocular lens: Secondary | ICD-10-CM | POA: Diagnosis not present

## 2023-07-17 DIAGNOSIS — H43813 Vitreous degeneration, bilateral: Secondary | ICD-10-CM | POA: Diagnosis not present

## 2023-07-17 DIAGNOSIS — H35373 Puckering of macula, bilateral: Secondary | ICD-10-CM | POA: Diagnosis not present

## 2023-07-17 DIAGNOSIS — H35033 Hypertensive retinopathy, bilateral: Secondary | ICD-10-CM | POA: Diagnosis not present

## 2023-08-21 DIAGNOSIS — H31093 Other chorioretinal scars, bilateral: Secondary | ICD-10-CM | POA: Diagnosis not present

## 2023-08-21 DIAGNOSIS — Z961 Presence of intraocular lens: Secondary | ICD-10-CM | POA: Diagnosis not present

## 2023-08-21 DIAGNOSIS — H35373 Puckering of macula, bilateral: Secondary | ICD-10-CM | POA: Diagnosis not present

## 2023-08-21 DIAGNOSIS — H43813 Vitreous degeneration, bilateral: Secondary | ICD-10-CM | POA: Diagnosis not present

## 2023-08-21 DIAGNOSIS — H35713 Central serous chorioretinopathy, bilateral: Secondary | ICD-10-CM | POA: Diagnosis not present

## 2023-08-21 DIAGNOSIS — H35033 Hypertensive retinopathy, bilateral: Secondary | ICD-10-CM | POA: Diagnosis not present

## 2023-08-22 DIAGNOSIS — K5150 Left sided colitis without complications: Secondary | ICD-10-CM | POA: Diagnosis not present

## 2023-10-09 DIAGNOSIS — E785 Hyperlipidemia, unspecified: Secondary | ICD-10-CM | POA: Diagnosis not present

## 2023-10-10 DIAGNOSIS — E785 Hyperlipidemia, unspecified: Secondary | ICD-10-CM | POA: Diagnosis not present

## 2023-10-10 DIAGNOSIS — I129 Hypertensive chronic kidney disease with stage 1 through stage 4 chronic kidney disease, or unspecified chronic kidney disease: Secondary | ICD-10-CM | POA: Diagnosis not present

## 2023-10-10 DIAGNOSIS — Z125 Encounter for screening for malignant neoplasm of prostate: Secondary | ICD-10-CM | POA: Diagnosis not present

## 2023-10-10 DIAGNOSIS — R0789 Other chest pain: Secondary | ICD-10-CM | POA: Diagnosis not present

## 2023-10-10 DIAGNOSIS — I4891 Unspecified atrial fibrillation: Secondary | ICD-10-CM | POA: Diagnosis not present

## 2023-10-16 DIAGNOSIS — I130 Hypertensive heart and chronic kidney disease with heart failure and stage 1 through stage 4 chronic kidney disease, or unspecified chronic kidney disease: Secondary | ICD-10-CM | POA: Diagnosis not present

## 2023-10-16 DIAGNOSIS — R809 Proteinuria, unspecified: Secondary | ICD-10-CM | POA: Diagnosis not present

## 2023-10-16 DIAGNOSIS — K5190 Ulcerative colitis, unspecified, without complications: Secondary | ICD-10-CM | POA: Diagnosis not present

## 2023-10-16 DIAGNOSIS — Z1339 Encounter for screening examination for other mental health and behavioral disorders: Secondary | ICD-10-CM | POA: Diagnosis not present

## 2023-10-16 DIAGNOSIS — Z0000 Encounter for general adult medical examination without abnormal findings: Secondary | ICD-10-CM | POA: Diagnosis not present

## 2023-10-16 DIAGNOSIS — D692 Other nonthrombocytopenic purpura: Secondary | ICD-10-CM | POA: Diagnosis not present

## 2023-10-16 DIAGNOSIS — I279 Pulmonary heart disease, unspecified: Secondary | ICD-10-CM | POA: Diagnosis not present

## 2023-10-16 DIAGNOSIS — Z1331 Encounter for screening for depression: Secondary | ICD-10-CM | POA: Diagnosis not present

## 2023-10-16 DIAGNOSIS — R82998 Other abnormal findings in urine: Secondary | ICD-10-CM | POA: Diagnosis not present

## 2023-10-16 DIAGNOSIS — D649 Anemia, unspecified: Secondary | ICD-10-CM | POA: Diagnosis not present

## 2023-10-16 DIAGNOSIS — E785 Hyperlipidemia, unspecified: Secondary | ICD-10-CM | POA: Diagnosis not present

## 2023-10-16 DIAGNOSIS — I5032 Chronic diastolic (congestive) heart failure: Secondary | ICD-10-CM | POA: Diagnosis not present

## 2023-10-16 DIAGNOSIS — I4891 Unspecified atrial fibrillation: Secondary | ICD-10-CM | POA: Diagnosis not present

## 2023-10-23 DIAGNOSIS — H31093 Other chorioretinal scars, bilateral: Secondary | ICD-10-CM | POA: Diagnosis not present

## 2023-10-23 DIAGNOSIS — H43813 Vitreous degeneration, bilateral: Secondary | ICD-10-CM | POA: Diagnosis not present

## 2023-10-23 DIAGNOSIS — H35713 Central serous chorioretinopathy, bilateral: Secondary | ICD-10-CM | POA: Diagnosis not present

## 2023-10-23 DIAGNOSIS — H35373 Puckering of macula, bilateral: Secondary | ICD-10-CM | POA: Diagnosis not present

## 2023-10-23 DIAGNOSIS — Z961 Presence of intraocular lens: Secondary | ICD-10-CM | POA: Diagnosis not present

## 2023-10-23 DIAGNOSIS — H35033 Hypertensive retinopathy, bilateral: Secondary | ICD-10-CM | POA: Diagnosis not present

## 2024-02-19 DIAGNOSIS — I279 Pulmonary heart disease, unspecified: Secondary | ICD-10-CM | POA: Diagnosis not present

## 2024-02-19 DIAGNOSIS — I129 Hypertensive chronic kidney disease with stage 1 through stage 4 chronic kidney disease, or unspecified chronic kidney disease: Secondary | ICD-10-CM | POA: Diagnosis not present

## 2024-02-19 DIAGNOSIS — D649 Anemia, unspecified: Secondary | ICD-10-CM | POA: Diagnosis not present

## 2024-02-19 DIAGNOSIS — I5032 Chronic diastolic (congestive) heart failure: Secondary | ICD-10-CM | POA: Diagnosis not present

## 2024-02-19 DIAGNOSIS — M25511 Pain in right shoulder: Secondary | ICD-10-CM | POA: Diagnosis not present

## 2024-02-19 DIAGNOSIS — K5190 Ulcerative colitis, unspecified, without complications: Secondary | ICD-10-CM | POA: Diagnosis not present

## 2024-02-19 DIAGNOSIS — E785 Hyperlipidemia, unspecified: Secondary | ICD-10-CM | POA: Diagnosis not present

## 2024-02-19 DIAGNOSIS — I4891 Unspecified atrial fibrillation: Secondary | ICD-10-CM | POA: Diagnosis not present

## 2024-02-19 DIAGNOSIS — R809 Proteinuria, unspecified: Secondary | ICD-10-CM | POA: Diagnosis not present

## 2024-02-20 DIAGNOSIS — H43813 Vitreous degeneration, bilateral: Secondary | ICD-10-CM | POA: Diagnosis not present

## 2024-02-20 DIAGNOSIS — H35713 Central serous chorioretinopathy, bilateral: Secondary | ICD-10-CM | POA: Diagnosis not present

## 2024-02-20 DIAGNOSIS — Z961 Presence of intraocular lens: Secondary | ICD-10-CM | POA: Diagnosis not present

## 2024-02-20 DIAGNOSIS — H35033 Hypertensive retinopathy, bilateral: Secondary | ICD-10-CM | POA: Diagnosis not present

## 2024-02-20 DIAGNOSIS — H31093 Other chorioretinal scars, bilateral: Secondary | ICD-10-CM | POA: Diagnosis not present

## 2024-02-20 DIAGNOSIS — H35373 Puckering of macula, bilateral: Secondary | ICD-10-CM | POA: Diagnosis not present

## 2024-02-29 DIAGNOSIS — M25511 Pain in right shoulder: Secondary | ICD-10-CM | POA: Diagnosis not present

## 2024-02-29 DIAGNOSIS — M25551 Pain in right hip: Secondary | ICD-10-CM | POA: Diagnosis not present

## 2024-04-25 NOTE — Progress Notes (Signed)
 This encounter was created in error - please disregard.

## 2024-05-08 ENCOUNTER — Ambulatory Visit: Attending: Cardiology | Admitting: Emergency Medicine

## 2024-05-08 ENCOUNTER — Encounter: Payer: Self-pay | Admitting: Emergency Medicine

## 2024-05-08 VITALS — BP 122/64 | HR 68 | Ht 70.0 in | Wt 183.0 lb

## 2024-05-08 DIAGNOSIS — I480 Paroxysmal atrial fibrillation: Secondary | ICD-10-CM

## 2024-05-08 DIAGNOSIS — I10 Essential (primary) hypertension: Secondary | ICD-10-CM

## 2024-05-08 DIAGNOSIS — E7800 Pure hypercholesterolemia, unspecified: Secondary | ICD-10-CM

## 2024-05-08 DIAGNOSIS — G4733 Obstructive sleep apnea (adult) (pediatric): Secondary | ICD-10-CM

## 2024-05-08 DIAGNOSIS — R011 Cardiac murmur, unspecified: Secondary | ICD-10-CM

## 2024-05-08 NOTE — Patient Instructions (Addendum)
 Medication Instructions:  NO CHANGES    Lab Work: NONE TODAY   Testing/Procedures: NONE  Follow-Up: At [facility], you and your health needs are our priority.  As part of our continuing mission to provide you with exceptional heart care, our providers are all part of one team.  This team includes your primary Cardiologist (physician) and Advanced Practice Providers or APPs (Physician Assistants and Nurse Practitioners) who all work together to provide you with the care you need, when you need it.  Your next appointment:   1 year  Provider:   MADISON FOUNTAIN, DNP   We recommend signing up for the patient portal called MyChart.  Sign up information is provided on this After Visit Summary.  MyChart is used to connect with patients for Virtual Visits (Telemedicine).  Patients are able to view lab/test results, encounter notes, upcoming appointments, etc.  Non-urgent messages can be sent to your provider as well.   To learn more about what you can do with MyChart, go to [email or web address].   Other Instructions YOUR PROVIDER RECOMMENDS YOU TO PURCHASE KARDIA, A CARDIAC DEVICE SO THAT YOU CAN MONITOR YOUR HEART'S RHYTHM.

## 2024-05-08 NOTE — Progress Notes (Unsigned)
 Cardiology Office Note:    Date:  05/09/2024  ID:  Daniel Dalton., DOB 09-11-1941, MRN [id number] PCP: Janey Santos, MD  [facility] Providers Cardiologist:  None       Patient Profile:       Chief Complaint: Follow-up for atrial fibrillation History of Present Illness:  Daniel Dalton. is a 83 y.o. male with visit-pertinent history of paroxysmal atrial fibrillation, hypertension, hyperlipidemia, ulcerative colitis, OSA (dental appliance instead of CPAP), prostate cancer s/p prostatectomy, large hiatal hernia  He was first diagnosed with atrial fibrillation in 2012.  He was cardiac unaware of his arrhythmia at that time.  Presented back in January 2020 with abdominal pain - had coffee ground emesis. Gastric outlet obstruction noted and surgery was performed for his large hiatal hernia/volvulus - he had robotic reduction/repair/partial fundoplication on 11/07/2018. Stay was complicated by sinus tach/MAT/AF with RVR - required IV metoprolol  until able to take po's. He has declined anticoagulation in the past. Discharged on 50 mg of Metoprolol .    He was last seen in clinic on 10/2021 by [place], [place].  He was maintaining sinus rhythm and his blood pressure was well-controlled.  No medication changes were made.  He has a follow-up in 1 year.   Discussed the use of AI scribe software for clinical note transcription with the patient, who gave verbal consent to proceed.  History of Present Illness Daniel Dalton. Daniel Dalton is an 83 year old male with atrial fibrillation who presents for a follow-up visit.  Today he is doing well overall.  He is without acute cardiovascular concerns or complaints at this time.  He is without any chest pain, shortness of breath, dyspnea, orthopnea, leg swelling.  He experiences occasional anxiety sensations occurring twice over the past year, sometimes with an increased heart rate but no palpitations, irregular heart rhythms, or lightheadedness.  He  maintains an active lifestyle with daily exercises such as leg lifts, squats, and walking, and works on projects around his house. He takes aspirin  and has opted not to start on blood thinners.   Review of systems:  Please see the history of present illness. All other systems are reviewed and otherwise negative.      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 The left ventricle appears to be normal in size, has moderate wall  thickness 60-65% ejection fraction Spectral Doppler shows indeterminate  pattern of diastolic filling.   2. The right ventricle has mildly enlarged size and normal systolic  function.   3. Right ventricular systolic pressure is could not be assessed due to  inability to visualize IVC.   4. Mildly dilated right atrial size.   5. Mitral valve regurgitation is trivial by color flow Doppler.   6. Tricuspid regurgitation is mild.   7. There is mild sclerosis of the aortic valve without stenosis.   Risk Assessment/Calculations:    CHA2DS2-VASc Score = 3   This indicates a 3.2% annual risk of stroke. The patient's score is based upon: CHF History: 0 HTN History: 1 Diabetes History: 0 Stroke History: 0 Vascular Disease History: 0 Age Score: 2 Gender Score: 0  Physical Exam:   VS:  BP 122/64 (BP Location: Left Arm, Patient Position: Sitting, Cuff Size: Normal)   Pulse 68   Ht 5' 10 (1.778 m)   Wt 183 lb (83 kg)   BMI 26.26 kg/m    Wt Readings from Last 3 Encounters:  05/08/24 183 lb (83 kg)  10/13/21 188 lb 6.4 oz (85.5 kg)  11/10/20 200 lb (90.7 kg)    GEN: Well nourished, well developed in no acute distress NECK: No JVD; No  carotid bruits CARDIAC: RRR, no murmurs, rubs, gallops RESPIRATORY:  Clear to auscultation without rales, wheezing or rhonchi  ABDOMEN: Soft, non-tender, non-distended EXTREMITIES:  No edema; No acute deformity      Assessment and Plan:  Paroxysmal atrial fibrillation Last episode in January 2020 in the setting of beta-blocker interruption due to gastric obstruction/surgery - EKG today shows patient is maintaining sinus rhythm - He has been without any symptoms concerning for recurrent atrial fibrillation - He has declined anticoagulation and is only on aspirin .  He understands his stroke risk - I educated him on monitoring heart rhythm at home with Kardia device - Continue metoprolol  tartrate 25 mg twice daily  Hypertension Blood pressure today is 122/64 and well-controlled - Continue telmisartan 80 mg daily  Obstructive sleep apnea Uses oral appliance instead of CPAP      Dispo:  Return in about 1 year (around 05/08/2025).  Signed, Lum LITTIE [name], NP

## 2024-05-09 ENCOUNTER — Encounter: Payer: Self-pay | Admitting: Emergency Medicine

## 2024-06-24 DIAGNOSIS — H35373 Puckering of macula, bilateral: Secondary | ICD-10-CM | POA: Diagnosis not present

## 2024-06-24 DIAGNOSIS — H35713 Central serous chorioretinopathy, bilateral: Secondary | ICD-10-CM | POA: Diagnosis not present

## 2024-06-24 DIAGNOSIS — Z961 Presence of intraocular lens: Secondary | ICD-10-CM | POA: Diagnosis not present

## 2024-06-24 DIAGNOSIS — H35033 Hypertensive retinopathy, bilateral: Secondary | ICD-10-CM | POA: Diagnosis not present

## 2024-06-24 DIAGNOSIS — H31093 Other chorioretinal scars, bilateral: Secondary | ICD-10-CM | POA: Diagnosis not present

## 2024-06-24 DIAGNOSIS — H43813 Vitreous degeneration, bilateral: Secondary | ICD-10-CM | POA: Diagnosis not present

## 2024-07-19 DIAGNOSIS — C61 Malignant neoplasm of prostate: Secondary | ICD-10-CM | POA: Diagnosis not present

## 2024-07-19 DIAGNOSIS — D649 Anemia, unspecified: Secondary | ICD-10-CM | POA: Diagnosis not present

## 2024-07-19 DIAGNOSIS — I110 Hypertensive heart disease with heart failure: Secondary | ICD-10-CM | POA: Diagnosis not present

## 2024-07-19 DIAGNOSIS — I5032 Chronic diastolic (congestive) heart failure: Secondary | ICD-10-CM | POA: Diagnosis not present

## 2024-07-19 DIAGNOSIS — M1990 Unspecified osteoarthritis, unspecified site: Secondary | ICD-10-CM | POA: Diagnosis not present

## 2024-07-19 DIAGNOSIS — I4891 Unspecified atrial fibrillation: Secondary | ICD-10-CM | POA: Diagnosis not present

## 2024-07-19 DIAGNOSIS — D692 Other nonthrombocytopenic purpura: Secondary | ICD-10-CM | POA: Diagnosis not present

## 2024-07-19 DIAGNOSIS — E785 Hyperlipidemia, unspecified: Secondary | ICD-10-CM | POA: Diagnosis not present

## 2024-07-19 DIAGNOSIS — G4733 Obstructive sleep apnea (adult) (pediatric): Secondary | ICD-10-CM | POA: Diagnosis not present
